# Patient Record
Sex: Male | Born: 1980
Health system: Southern US, Community
[De-identification: ages and names within clinical notes are randomized; demographics above are authoritative.]

## PROBLEM LIST (undated history)

## (undated) DIAGNOSIS — K219 Gastro-esophageal reflux disease without esophagitis: Secondary | ICD-10-CM

## (undated) HISTORY — DX: Gastro-esophageal reflux disease without esophagitis: K21.9

---

## 2000-07-06 ENCOUNTER — Emergency Department (HOSPITAL_COMMUNITY): Admission: EM | Admit: 2000-07-06 | Discharge: 2000-07-06 | Payer: Self-pay | Admitting: Emergency Medicine

## 2000-10-09 ENCOUNTER — Inpatient Hospital Stay (HOSPITAL_COMMUNITY): Admission: EM | Admit: 2000-10-09 | Discharge: 2000-10-15 | Payer: Self-pay | Admitting: Psychiatry

## 2003-01-30 ENCOUNTER — Emergency Department (HOSPITAL_COMMUNITY): Admission: EM | Admit: 2003-01-30 | Discharge: 2003-01-30 | Payer: Self-pay | Admitting: Emergency Medicine

## 2003-01-30 ENCOUNTER — Inpatient Hospital Stay (HOSPITAL_COMMUNITY): Admission: EM | Admit: 2003-01-30 | Discharge: 2003-02-08 | Payer: Self-pay | Admitting: Psychiatry

## 2005-04-24 IMAGING — CT CT HEAD W/O CM
2 of 5 series · 15 of 40 positions shown, 18 images · non-contrast
Comparison: none

CLINICAL DATA: Status post fall with right shoulder and left orbital pain.  
RIGHT SHOULDER (THREE VIEWS) 01/30/03
There is no evidence of fracture or dislocation. No other significant bone or soft tissue abnormalities are identified.

[Series 104: supine sinues · axial · 0.33mm/px · z∈[+54,+162]mm · 12 of 203 slices shown, 15 images]
[im 11/203  brain]
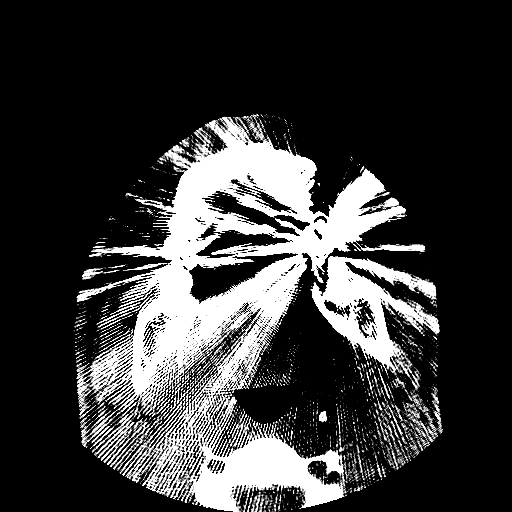
[im 11/203  bone]
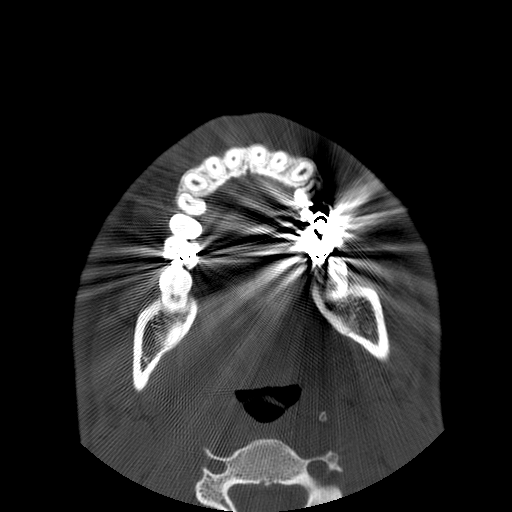
[im 32/203  brain]
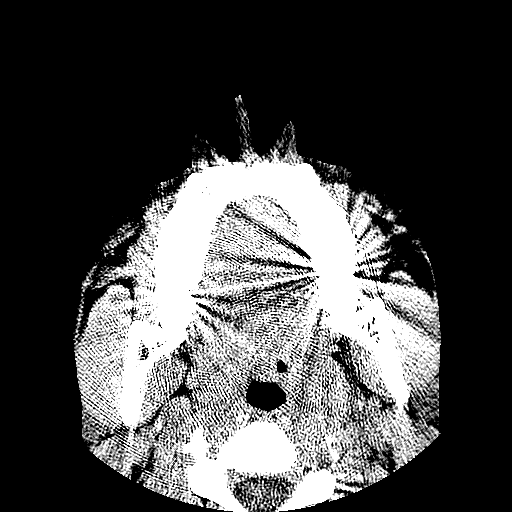
[im 43/203  brain]
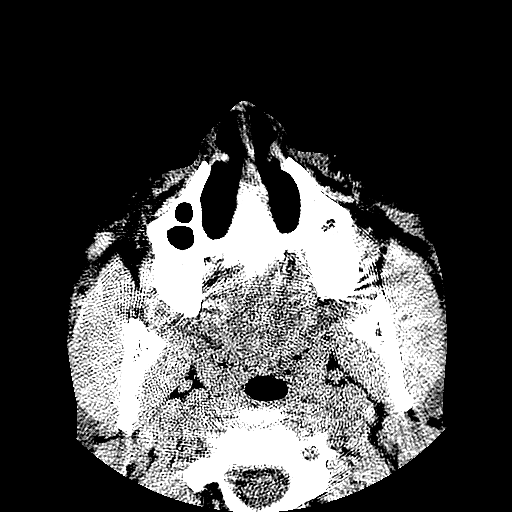
[im 64/203  brain]
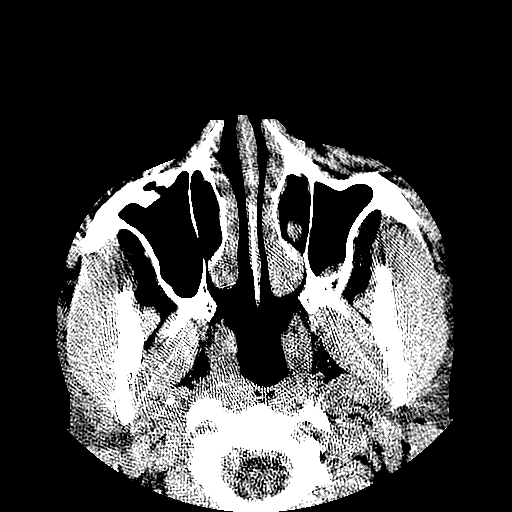
[im 75/203  brain]
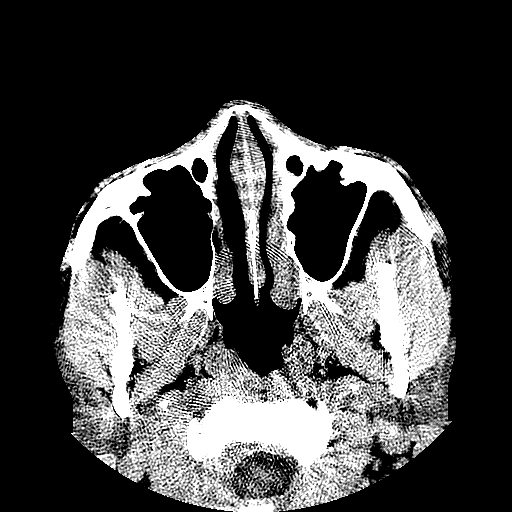
[im 75/203  bone]
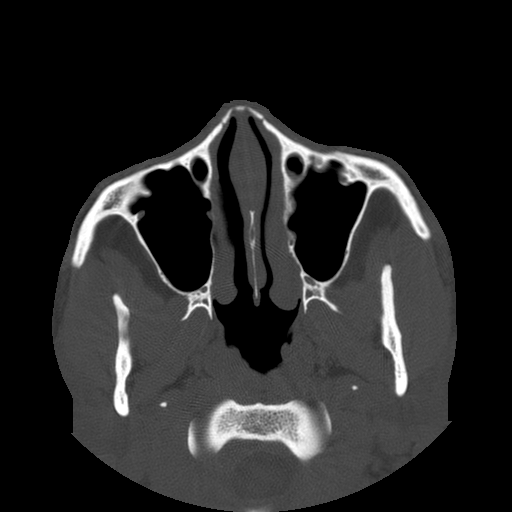
[im 96/203  brain]
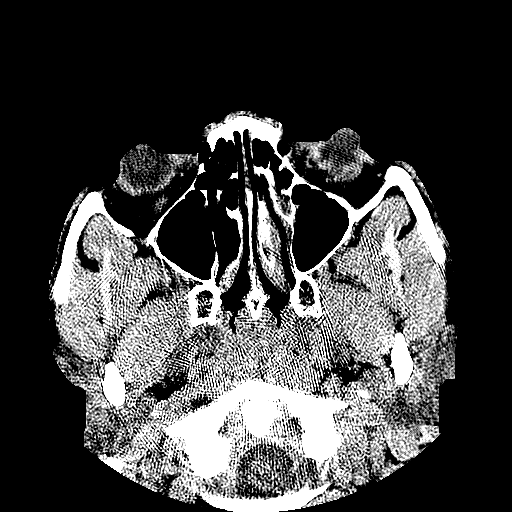
[im 107/203  brain]
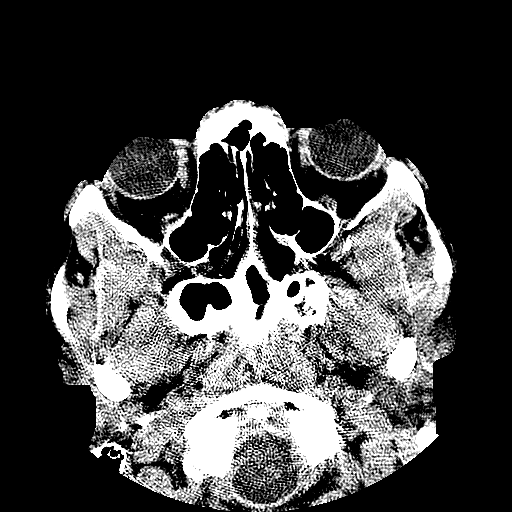
[im 128/203  brain]
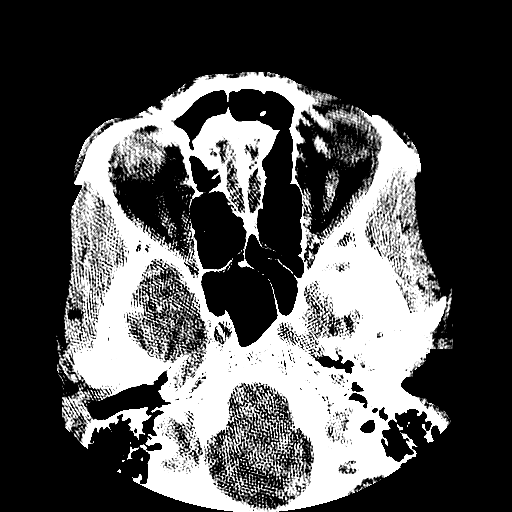
[im 139/203  brain]
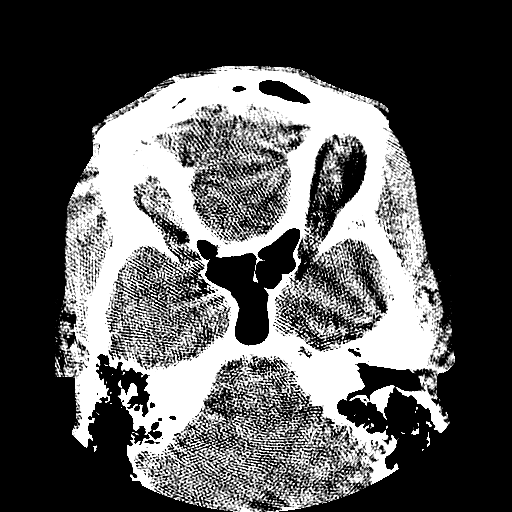
[im 139/203  bone]
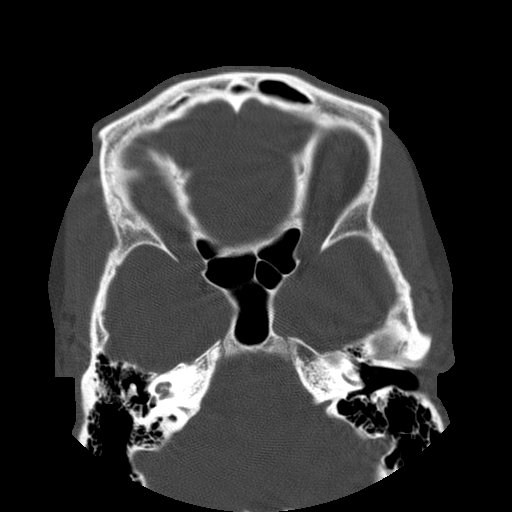
[im 160/203  brain]
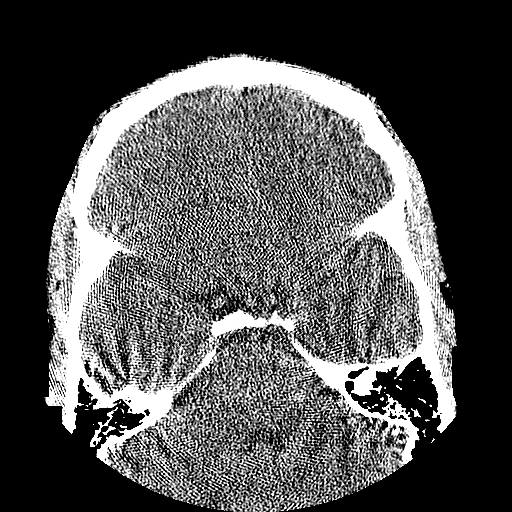
[im 171/203  brain]
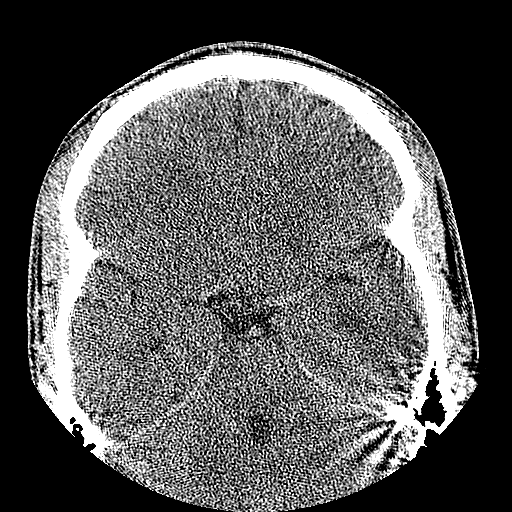
[im 192/203  brain]
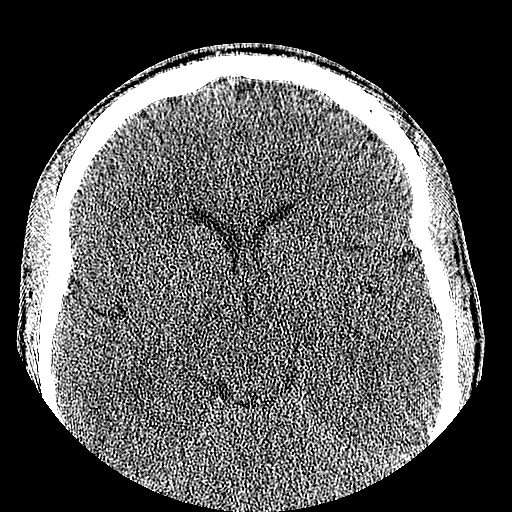

[Series 105: reformatted · coronal · 0.33mm/px · 3 of 42 slices shown]
[im 14/42  brain]
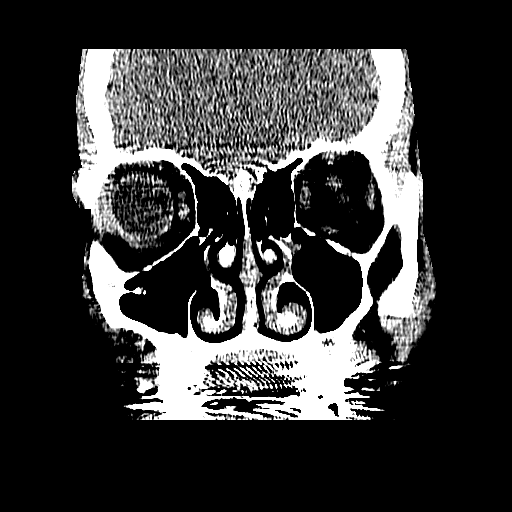
[im 19/42  brain]
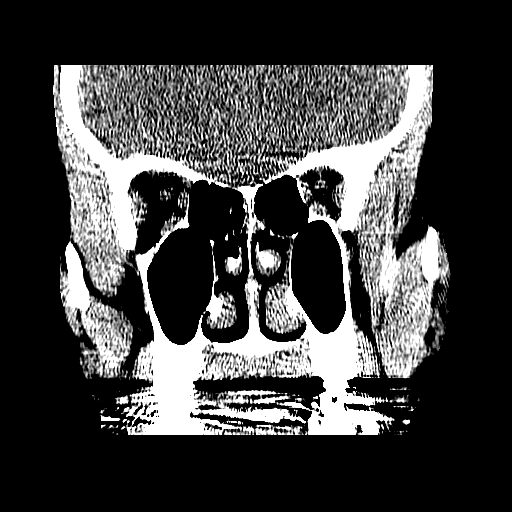
[im 23/42  brain]
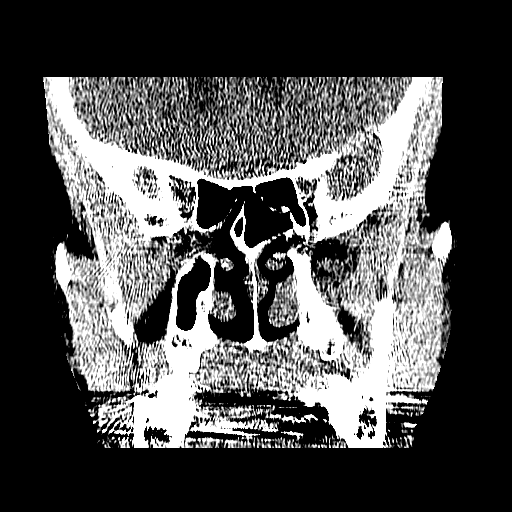

[15 of 40 positions shown; findings below may reference images not displayed]

IMPRESSION
Normal study. 
CT HEAD WITHOUT CONTRAST 01/30/03
Routine noncontrast head CT was performed. 

There is no evidence of intracranial hemorrhage, brain edema, or mass effect. The ventricles are normal. No extraaxial abnormalities are identified. Bone windows show no significant abnormalities.

IMPRESSION

Negative noncontrast head CT. 

ORBITAL CT WITHOUT CONTRAST 01/30/03

Multidetector helical CT imaging is performed through the orbits in the axial plane without IV contrast.  Coronal images were then reconstructed.  
There is lucency noted through the base of the zygomatic arch as it joins the left lateral orbit.  This is asymmetric when compared to the right side. Question a nondisplaced fracture through the base of the zygomatic arch.  This alternatively could represent an unfused suture.  No other fracture is seen.  Mucoperiosteal thickening noted in the left maxillary sinus.  Left orbit unremarkable.  
IMPRESSION
Asymmetric linear lucency through the anterior base of the left zygomatic arch as it joins the left lateral orbital wall.  Question nondisplaced fracture versus unfused suture.  

ree

## 2006-02-17 ENCOUNTER — Ambulatory Visit: Payer: Self-pay | Admitting: *Deleted

## 2006-02-17 ENCOUNTER — Inpatient Hospital Stay (HOSPITAL_COMMUNITY): Admission: AD | Admit: 2006-02-17 | Discharge: 2006-02-22 | Payer: Self-pay | Admitting: *Deleted

## 2006-12-22 ENCOUNTER — Ambulatory Visit: Payer: Self-pay | Admitting: Psychiatry

## 2006-12-22 ENCOUNTER — Inpatient Hospital Stay (HOSPITAL_COMMUNITY): Admission: AD | Admit: 2006-12-22 | Discharge: 2006-12-31 | Payer: Self-pay | Admitting: Psychiatry

## 2007-06-03 ENCOUNTER — Emergency Department (HOSPITAL_COMMUNITY): Admission: EM | Admit: 2007-06-03 | Discharge: 2007-06-03 | Payer: Self-pay | Admitting: Emergency Medicine

## 2007-06-05 ENCOUNTER — Inpatient Hospital Stay (HOSPITAL_COMMUNITY): Admission: AD | Admit: 2007-06-05 | Discharge: 2007-06-21 | Payer: Self-pay | Admitting: *Deleted

## 2007-06-05 ENCOUNTER — Ambulatory Visit: Payer: Self-pay | Admitting: *Deleted

## 2010-02-24 ENCOUNTER — Encounter: Payer: Self-pay | Admitting: Psychiatry

## 2010-06-19 NOTE — H&P (Signed)
NAME:  James [name], James [name] NO.:  [id number]   MEDICAL RECORD NO.:  [id number]          PATIENT TYPE:  IPS   LOCATION:  0403                          [facility]ACILITY[facility]  [facility]   PHYSICIAN:  Jasmine Pang, M.D. DATE OF BIRTH:  [birth year]   DATE OF ADMISSION:  06/05/2007  DATE OF DISCHARGE:                       PSYCHIATRIC ADMISSION ASSESSMENT   PATIENT IDENTIFICATION:  This is a 30 year old involuntary committed on  Jun 05, 2007.   HISTORY OF PRESENT ILLNESS:  The patient is here on petition with  petition papers stating the patient has been exhibiting posturing, lying  on the floor, tapping his feet like he is dancing and having increased  agitation.  Believes his family is trying to kill him.  Also reports a  poor sleep and poor hygiene.  The patient was apparently petitioned per  his father.  Also stated on petition papers that the patient is hostile  and aggressive, history of schizophrenia, respondent, was hearing voices  and hallucinating and is considered a danger to himself and others.   PAST PSYCHIATRIC HISTORY:  This is a second admission.  The patient was  here in November 2008 for threatening behaviors at home.  Patient of  [facility].   SOCIAL HISTORY:  This is a 30 year old single male whose living  conditions are unknown.  Support system is unclear.  It appears to be  his father right now is one of his sole support.   FAMILY HISTORY:  Unknown.   DRUG HISTORY:  The patient smokes cigarettes.  No apparent alcohol and  drug use.   PRIMARY CARE Hermilo Dutter:  Unclear.   MEDICAL PROBLEMS:  No known acute or chronic health conditions.   MEDICATIONS:  The patient has been noncompliant with his Risperdal  injections.   DRUG ALLERGIES:  No known allergies.   PHYSICAL EXAMINATION:  GENERAL:  This is a thin young male who is  walking the halls at this time, was fully assessed at [facility].  VITAL SIGNS:  Temperature is 97.9, 67 heart  rate, 60 respirations, blood  pressure is 112/66, 99% saturated.  He is 152 pounds, 5 feet 10 inches  tall, somewhat disheveled.  His laboratory data:  Urine drug screen is  negative.  Urinalysis negative.  CBC within normal limits.  B-met within  normal limits.   MENTAL STATUS EXAM:  This is an alert, again young male, tall.  Poor eye  contact.  Again, walking the halls, seems unaware that someone is  talking to him trying to obtain information.  There is poor eye contact.  He is having some thought blocking.  Appears to be internally  distracted.  Facial expression is constricted.  Thought process  endorsing auditory hallucinations with some possible paranoid ideation.  He is a poor historian.  His judgment insight is poor.   DIAGNOSES:  AXIS I:  Schizophrenia, paranoid type.  AXIS II:  Deferred.  AXIS III:  No known medical conditions.  AXIS IV:  Psychosocial problems related to burden of illness.  AXIS V:  Current is 25.   PLANS:  Contact for safety.  We  will stabilize mood thinking.  We will  initiate Risperdal at bedtime.  Have Risperdal and Ativan available on a  p.r.n. basis.  Will continue to gather more history.  Assess his support  system.  Reinforce medication compliance.  Case manager will assess his  follow-up.  His tentative length of stay is 5-7 days, more depending on  patient response to medication.      Landry Corporal, N.P.      Jasmine Pang, M.D.  Electronically Signed    JO/MEDQ  D:  06/10/2007  T:  06/10/2007  Job:  [id number]
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
## 2010-06-22 NOTE — Discharge Summary (Signed)
NAME:  James [name], James [name].:  [id number]   MEDICAL RECORD [name].:  [id number]          PATIENT TYPE:  IPS   LOCATION:  0406                          FACILITY:  [facility]   PHYSICIAN:  Anselm Jungling, MD  DATE OF BIRTH:  [birth year]   DATE OF ADMISSION:  12/22/2006  DATE OF DISCHARGE:  12/31/2006                               DISCHARGE SUMMARY   IDENTIFYING DATA/REASON FOR ADMISSION:  This was an inpatient  psychiatric admission for James [name], a 30 year old single African American  male who lives with his parents and is unemployed.  He was petitioned by  his mother due to threatening behaviors at home.  He had a long history  of mental illness and had been a client of [facility].  Please refer to the admission note for further details  pertaining to the symptoms, circumstances and history that led to his  hospitalization.  He was given an initial Axis I diagnosis of  schizophrenia, chronic paranoid type, acute exacerbation.   MEDICAL/LABORATORY:  The patient was medically and physically assessed  by the psychiatric nurse practitioner.  He was in good health without  any active or chronic medical problems.   HOSPITAL COURSE:  The patient was admitted to the Adult Inpatient  Psychiatric Service.  He presented as a well-nourished, well-developed  young man who was only partially oriented initially, quite soft-spoken,  suspicious and guarded.  He was immediately focused on the question of  whether he could go home.  He made [name] overtly delusional statements.  Although he denied auditory hallucinations, he appeared to be responding  to internal stimuli.   The patient was treated with Haldol, which he had been treated with  previously at [facility].  We attempted to involve  him in the inpatient milieu, but he remained quite isolative, withdrawn,  and guarded.  It was difficult to cajole him into participating at all.  He continued to deny  auditory hallucinations, but still appeared  internally preoccupied.   The psychiatric case manager was in contact with the patient's parents,  and there was a family meeting that occurred on the sixth hospital day,  in which the patient's condition and aftercare needs discussed at  length.   The patient continued to be a poor participant in the treatment program.  He remained only interested in discharge throughout his stay.  He was  discharged at the point where it was felt that he was not likely to  benefit from any further hospitalization, and when we concluded that we  were not going to be able to get him to participate to any degree that  was going to benefit him.   AFTERCARE:  The patient was to follow up at the [facility] with  their psychiatrist on January 06, 2007.   DISCHARGE MEDICATIONS:  1. Haldol Decanoate 150 mg IM every 2 weeks, next due January 06, 2007.  2. Cogentin 1 mg b.i.d.  3. Haldol 5 mg orally in the morning and 10 mg orally at bedtime.   DISCHARGE DIAGNOSES:  AXIS  I: Schizophrenia, chronic paranoid type.  AXIS II: Deferred.  AXIS III: [name] acute or chronic illnesses.  AXIS IV: Stressors severe.  AXIS V: Global assessment of functioning (GAF) on discharge 45.      Anselm Jungling, MD  Electronically Signed     SPB/MEDQ  D:  01/20/2007  T:  01/21/2007  Job:  [id number]

## 2010-06-22 NOTE — Discharge Summary (Signed)
NAME:  YAZID, POP NO.:  [id number]   MEDICAL RECORD NO.:  [id number]          PATIENT TYPE:  IPS   LOCATION:  0403                          FACILITY:  [facility]   PHYSICIAN:  Antonietta Breach, M.D.  DATE OF BIRTH:  06/12/1980   DATE OF ADMISSION:  06/05/2007  DATE OF DISCHARGE:  06/21/2007                               DISCHARGE SUMMARY   IDENTIFYING DATA AND REASON FOR ADMISSION:  Mr. Harol Shabazz is a 30-  year-old male who presented with bizarre behavior.  He was having  increased agitation.  He was complaining that his family was trying to  kill and that his sleep was poor.  He was also displaying poor hygiene.   MEDICAL PROBLEMS:  None.   HOSPITAL COURSE:  Mr. Dubuque was admitted to the inpatient adult  psychiatric unit of the [facility] and underwent milieu  and group psychotherapy.  His Risperdal was titrated to 1 mg q.a.m. and  3 mg every evening.  He also had Risperdal Consta given 37.5 mg IM x1 on  Jun 15, 2007.   He was continued on Cogentin 1 mg t.i.d. p.r.n. as well as Ativan 2 mg  q.6 h p.r.n.   By Jun 17, 2007 Mr. Hazen had improved.  He was cooperative and  redirectable.  He was not showing any delusions, hallucinations or  destructive thoughts.   CONDITION ON DISCHARGE:  By Jun 21, 2007 Mr. Caraher was continuing in  a stable pattern.  He had to wait a few extra days for his parents to  come back from out of town.  They did not want him to be discharged home  without their presence.  He had continued to be redirectable and  socially appropriate.  He was not having any thoughts of harming himself  or others.  His delusions and hallucinations had remained resolved.   DISCHARGE/PLAN:  Please see the discharge instruction sheet.   DISCHARGE MEDICATIONS:  1. Risperdal 3 mg every evening.  2. The patient will be receiving Risperdal Consta injection 37.5 mg on      approximately Jun 29, 2007.   DISCHARGE DIAGNOSES:  AXIS  I:  Schizophrenia paranoid type, chronic with  acute exacerbation, now stable.  AXIS II:  None.  AXIS III:  None.  AXIS IV: Primary support group.  AXIS V: 55.      Antonietta Breach, M.D.  Electronically Signed    JW/MEDQ  D:  08/17/2007  T:  08/17/2007  Job:  [id number]
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
## 2010-10-30 LAB — URINALYSIS, ROUTINE W REFLEX MICROSCOPIC
Bilirubin Urine: NEGATIVE
Glucose, UA: NEGATIVE
Hgb urine dipstick: NEGATIVE
Ketones, ur: NEGATIVE
Nitrite: NEGATIVE
Protein, ur: NEGATIVE
Specific Gravity, Urine: 1.025
Urobilinogen, UA: 1
pH: 6.5

## 2010-10-30 LAB — RAPID URINE DRUG SCREEN, HOSP PERFORMED
Amphetamines: NOT DETECTED
Barbiturates: NOT DETECTED
Benzodiazepines: NOT DETECTED
Cocaine: NOT DETECTED
Opiates: NOT DETECTED
Tetrahydrocannabinol: NOT DETECTED

## 2010-10-30 LAB — COMPREHENSIVE METABOLIC PANEL
ALT: 23
AST: 22
Albumin: 3.7
Alkaline Phosphatase: 56
BUN: 8
CO2: 28
Calcium: 9.3
Chloride: 109
Creatinine, Ser: 0.79
GFR calc Af Amer: >60
GFR calc non Af Amer: >60
Glucose, Bld: 84
Potassium: 3.6
Sodium: 142
Total Bilirubin: 0.8
Total Protein: 6.2

## 2010-10-30 LAB — CBC
HCT: 43.2
Hemoglobin: 13.7
MCHC: 31.7
MCV: 88.4
Platelets: 197
RBC: 4.88
RDW: 14.3
WBC: 4.7

## 2010-10-30 LAB — DIFFERENTIAL
Basophils Absolute: 0
Basophils Relative: 1
Eosinophils Absolute: 0.2
Eosinophils Relative: 4
Lymphocytes Relative: 36
Lymphs Abs: 1.7
Monocytes Absolute: 0.4
Monocytes Relative: 9
Neutro Abs: 2.3
Neutrophils Relative %: 50

## 2010-10-30 LAB — ETHANOL: Alcohol, Ethyl (B): <5

## 2010-11-13 LAB — CBC
HCT: 44.2
Hemoglobin: 15.4
MCHC: 34.9
MCV: 85.9
Platelets: 194
RBC: 5.14
RDW: 13.9
WBC: 3.7 — ABNORMAL LOW

## 2010-11-13 LAB — COMPREHENSIVE METABOLIC PANEL
ALT: 36
AST: 28
Albumin: 3.9
Alkaline Phosphatase: 70
BUN: 13
CO2: 29
Calcium: 9.6
Chloride: 105
Creatinine, Ser: 0.98
GFR calc Af Amer: >60
GFR calc non Af Amer: >60
Glucose, Bld: 103 — ABNORMAL HIGH
Potassium: 3.9
Sodium: 139
Total Bilirubin: 0.7
Total Protein: 6.6

## 2010-11-13 LAB — TSH: TSH: 0.404

## 2013-10-30 DIAGNOSIS — F259 Schizoaffective disorder, unspecified: Secondary | ICD-10-CM | POA: Diagnosis not present

## 2013-12-30 DIAGNOSIS — F259 Schizoaffective disorder, unspecified: Secondary | ICD-10-CM | POA: Diagnosis not present

## 2015-05-02 ENCOUNTER — Ambulatory Visit: Payer: Self-pay | Admitting: Internal Medicine

## 2015-05-10 ENCOUNTER — Telehealth: Payer: Self-pay

## 2015-05-11 ENCOUNTER — Encounter: Payer: Self-pay | Admitting: Medical

## 2015-05-11 ENCOUNTER — Ambulatory Visit (INDEPENDENT_AMBULATORY_CARE_PROVIDER_SITE_OTHER): Payer: Medicare Other | Admitting: Medical

## 2015-05-11 VITALS — BP 100/70 | HR 89 | Temp 97.9°F | Ht 70.5 in | Wt 183.2 lb

## 2015-05-11 DIAGNOSIS — F17200 Nicotine dependence, unspecified, uncomplicated: Secondary | ICD-10-CM

## 2015-05-11 DIAGNOSIS — Z720 Tobacco use: Secondary | ICD-10-CM

## 2015-05-11 DIAGNOSIS — F209 Schizophrenia, unspecified: Secondary | ICD-10-CM | POA: Diagnosis not present

## 2015-05-11 DIAGNOSIS — R739 Hyperglycemia, unspecified: Secondary | ICD-10-CM | POA: Diagnosis not present

## 2015-05-11 DIAGNOSIS — D72819 Decreased white blood cell count, unspecified: Secondary | ICD-10-CM | POA: Diagnosis not present

## 2015-05-11 LAB — CBC WITH DIFFERENTIAL/PLATELET
Basophils Absolute: 0 10*3/uL (ref 0.0–0.1)
Basophils Relative: 0.4 % (ref 0.0–3.0)
Eosinophils Absolute: 0.2 10*3/uL (ref 0.0–0.7)
Eosinophils Relative: 2.5 % (ref 0.0–5.0)
HCT: 42.9 % (ref 39.0–52.0)
Hemoglobin: 14.2 g/dL (ref 13.0–17.0)
Lymphocytes Relative: 36.5 % (ref 12.0–46.0)
Lymphs Abs: 2.9 10*3/uL (ref 0.7–4.0)
MCHC: 33.1 g/dL (ref 30.0–36.0)
MCV: 87.3 fl (ref 78.0–100.0)
Monocytes Absolute: 0.5 10*3/uL (ref 0.1–1.0)
Monocytes Relative: 6.4 % (ref 3.0–12.0)
Neutro Abs: 4.3 10*3/uL (ref 1.4–7.7)
Neutrophils Relative %: 54.2 % (ref 43.0–77.0)
Platelets: 237 10*3/uL (ref 150.0–400.0)
RBC: 4.92 Mil/uL (ref 4.22–5.81)
RDW: 15.2 % (ref 11.5–15.5)
WBC: 8 10*3/uL (ref 4.0–10.5)

## 2015-05-11 LAB — COMPREHENSIVE METABOLIC PANEL
ALT: 19 U/L (ref 0–53)
AST: 15 U/L (ref 0–37)
Albumin: 4.5 g/dL (ref 3.5–5.2)
Alkaline Phosphatase: 59 U/L (ref 39–117)
BUN: 9 mg/dL (ref 6–23)
CO2: 28 mEq/L (ref 19–32)
Calcium: 10.2 mg/dL (ref 8.4–10.5)
Chloride: 109 mEq/L (ref 96–112)
Creatinine, Ser: 0.92 mg/dL (ref 0.40–1.50)
GFR: 119.63 mL/min (ref >60.00)
Glucose, Bld: 97 mg/dL (ref 70–99)
Potassium: 4.1 mEq/L (ref 3.5–5.1)
Sodium: 141 mEq/L (ref 135–145)
Total Bilirubin: 0.3 mg/dL (ref 0.2–1.2)
Total Protein: 7.4 g/dL (ref 6.0–8.3)

## 2015-05-11 LAB — HEMOGLOBIN A1C: Hgb A1c MFr Bld: 5.6 % (ref 4.6–6.5)

## 2015-05-11 MED ORDER — METFORMIN HCL 500 MG PO TABS: 500.0000 mg | ORAL_TABLET | Freq: Two times a day (BID) | ORAL | Status: DC

## 2015-05-11 NOTE — Telephone Encounter (Signed)
Will you get rx that was printed and fax to his pharmacy.

## 2015-05-11 NOTE — Patient Instructions (Addendum)
For schizophrenia. Continue on current meds rx'd by psychiatrist. If any worsening  mood or behavior issues notify us and psychiatrist. If severe mood or behavior problems then ED evaluation.  For smoking cessation would get opinion of psychiatrist. Wellbutrin is one method as well as chantix. With his schizophrenia not sure this is good idea.  Will get cbc today to check wbc.  For mild sugar elevation and family hx, will get a1-c and then decide on appropiate dose of metformin.  Follow up in 2 weeks or as needed.  Please sign release of information form so we can get old records.

## 2015-05-11 NOTE — Progress Notes (Signed)
Subjective:    Patient ID: James [name], male    DOB: 1980/08/13, 35 y.o.   MRN: [id number]  HPI   I have reviewed pt PMH, PSH, FH, Social History and Surgical History.  Pt is on disability(mom guardian), pt does exercise about 1 mile a day, no caffeine beverage, Admits not eating healthy, single. Lives with mom.  [facility] He was discharged in  December.  Pt sugar has not been elevated much in the past. One sugar level was 103.  No a1c in the chart. Pt psychiatrist recommend metformin but they did not rx for more than about one month. Pt has been without prescription for a while. So mom gave her own rx.  Pt has history of schizophrenia. Pt denies any visual or auditory hallucinations. Mom states he is much better to manage. Mom has PHD in physics.   Pt has last psychiatrist April 20, 2015 at [facility]. NP psychchiatrist.    Review of Systems  Constitutional: Negative for fever, chills and fatigue.  HENT: Negative for congestion and ear pain.   Respiratory: Negative for cough, choking, shortness of breath and wheezing.   Musculoskeletal: Negative for back pain and gait problem.  Neurological: Negative for dizziness, syncope, speech difficulty and headaches.  Hematological:       Related to dx but stable recently per mom  Psychiatric/Behavioral: Positive for behavioral problems. Negative for suicidal ideas, confusion and dysphoric mood. The patient is not nervous/anxious and is not hyperactive.        My first time meeting him and exhibits odd behavior.    History reviewed. No pertinent past medical history.  Social History   Social History  . Marital Status: Single    Spouse Name: N/A  . Number of Children: N/A  . Years of Education: N/A   Occupational History  . Not on file.   Social History Main Topics  . Smoking status: Current Every Day Smoker  . Smokeless tobacco: Never Used  . Alcohol Use:  No  . Drug Use: No  . Sexual Activity: Not on file   Other Topics Concern  . Not on file   Social History Narrative  . No narrative on file    History reviewed. No pertinent past surgical history.  History reviewed. No pertinent family history.  Allergies  Allergen Reactions  . Clozapine Other (See Comments)    No current outpatient prescriptions on file prior to visit.   No current facility-administered medications on file prior to visit.    BP 100/70 mmHg  Pulse 89  Temp(Src) 97.9 F (36.6 C) (Oral)  Ht 5' 10.5" (1.791 m)  Wt 183 lb 3.2 oz (83.099 kg)  BMI 25.91 kg/m2  SpO2 97%       Objective:   Physical Exam  General Mental Status- Alert. General Appearance- Not in acute distress. Pleasant but has off demeanor.  Skin General: Color- Normal Color. Moisture- Normal Moisture.  Neck Carotid Arteries- Normal color. Moisture- Normal Moisture. No carotid bruits. No JVD.  Chest and Lung Exam Auscultation: Breath Sounds:-Normal.  Cardiovascular Auscultation:Rythm- Regular. Murmurs & Other Heart Sounds:Auscultation of the heart reveals- No Murmurs.  Abdomen Inspection:-Inspeection Normal. Palpation/Percussion:Note:No mass. Palpation and Percussion of the abdomen reveal- Non Tender, Non Distended + BS, no rebound or guarding.   Neurologic Cranial Nerve exam:- CN III-XII intact(No nystagmus), symmetric smile. Strength:- 5/5 equal and symmetric strength both upper and lower extremities.  Assessment & Plan:  For schizophrenia. Continue on current meds rx'd by psychiatrist. If any worsening  mood or behavior issues notify us and psychiatrist. If severe mood or behavior problems then ED evaluation.  For smoking cessation would get opinion of psychiatrist. Wellbutrin is one method as well as chantix. With his schizophrenia not sure this is good idea.  Will get cbc today to check wbc.  For mild sugar elevation and family hx, will get a1-c and then  decide on appropiate dose of metformin.  Follow up in 2 weeks or as needed.  Please sign release of information form so we can get old records.

## 2015-05-11 NOTE — Addendum Note (Signed)
Addended by: Neldon LabellaMABE, HOLDEN S on: 05/11/2015 07:23 PM   Modules accepted: Orders

## 2015-05-11 NOTE — Progress Notes (Signed)
Pre visit review using our clinic review tool, if applicable. No additional management support is needed unless otherwise documented below in the visit note. 

## 2015-06-01 ENCOUNTER — Ambulatory Visit (INDEPENDENT_AMBULATORY_CARE_PROVIDER_SITE_OTHER): Payer: Medicare Other | Admitting: Medical

## 2015-06-01 ENCOUNTER — Encounter: Payer: Self-pay | Admitting: Medical

## 2015-06-01 VITALS — BP 112/72 | HR 78 | Temp 98.3°F | Ht 70.5 in | Wt 181.0 lb

## 2015-06-01 DIAGNOSIS — F17200 Nicotine dependence, unspecified, uncomplicated: Secondary | ICD-10-CM

## 2015-06-01 DIAGNOSIS — F209 Schizophrenia, unspecified: Secondary | ICD-10-CM | POA: Diagnosis not present

## 2015-06-01 DIAGNOSIS — Z720 Tobacco use: Secondary | ICD-10-CM | POA: Diagnosis not present

## 2015-06-01 DIAGNOSIS — K5900 Constipation, unspecified: Secondary | ICD-10-CM | POA: Diagnosis not present

## 2015-06-01 DIAGNOSIS — R739 Hyperglycemia, unspecified: Secondary | ICD-10-CM | POA: Diagnosis not present

## 2015-06-01 NOTE — Progress Notes (Signed)
Pre visit review using our clinic review tool, if applicable. No additional management support is needed unless otherwise documented below in the visit note. 

## 2015-06-01 NOTE — Progress Notes (Signed)
Subjective:    Patient ID: James [name], male    DOB: March 01, 1980, 35 y.o.   MRN: [id number]  HPI  Pt seeing psychiatrist at [facility]. Pt states mood is good today. Hx of schizophrenia.  Pt smoking still. Pt is smoking 2 packs a day.  Pt is on metformin since he had some mild elevated sugars in the past. His a1-c was well controlled on last check.  Occasional mild constipation. Mom give him pericolace.Pt eats a lot of processed and junk food per mom. Pt last bm was yesterday and normal. No abdomen pain. No history bowel obstruction. No abdomen surgeries.    Review of Systems  Constitutional: Negative for fever, chills and fatigue.  Respiratory: Negative for cough, choking, shortness of breath and wheezing.   Cardiovascular: Negative for chest pain and palpitations.  Gastrointestinal: Positive for constipation. Negative for nausea, vomiting, abdominal pain, diarrhea and rectal pain.       See hpi occasional.  Skin: Negative for rash.  Psychiatric/Behavioral: Negative for suicidal ideas, behavioral problems, confusion and dysphoric mood. The patient is not nervous/anxious.        See hpi. Pt see [facility]. Stable today.   No past medical history on file.   Social History   Social History  . Marital Status: Single    Spouse Name: N/A  . Number of Children: N/A  . Years of Education: N/A   Occupational History  . Not on file.   Social History Main Topics  . Smoking status: Current Every Day Smoker -- 2.00 packs/day    Types: Cigarettes  . Smokeless tobacco: Never Used  . Alcohol Use: No     Comment: rare alcohol  . Drug Use: No  . Sexual Activity: Not on file   Other Topics Concern  . Not on file   Social History Narrative    No past surgical history on file.  Family History  Problem Relation Age of Onset  . Diabetes Mother   . Diabetes Father   . Prostate cancer Father     Allergies  Allergen Reactions  . Clozapine Other (See Comments)    Current  Outpatient Prescriptions on File Prior to Visit  Medication Sig Dispense Refill  . LATUDA 80 MG TABS tablet 80 mg 2 (two) times daily.     Marland Kitchen lithium carbonate (ESKALITH) 450 MG CR tablet     . metFORMIN (GLUCOPHAGE) 500 MG tablet Take 1 tablet (500 mg total) by mouth 2 (two) times daily with a meal. 60 tablet 3  . Misc Natural Products (FOCUSED MIND PO) Take 1 capsule by mouth.    . Multiple Vitamins-Minerals (MULTI COMPLETE PO) Take 1 capsule by mouth 1 day or 1 dose.    Marland Kitchen OLANZapine (ZYPREXA) 15 MG tablet      No current facility-administered medications on file prior to visit.    BP 112/72 mmHg  Pulse 78  Temp(Src) 98.3 F (36.8 C) (Oral)  Ht 5' 10.5" (1.791 m)  Wt 181 lb (82.101 kg)  BMI 25.60 kg/m2  SpO2 97%        Objective:   Physical Exam  General Mental Status- Alert. General Appearance- Not in acute distress.   Skin General: Color- Normal Color. Moisture- Normal Moisture.  Neck Carotid Arteries- Normal color. Moisture- Normal Moisture. No carotid bruits. No JVD.  Chest and Lung Exam Auscultation: Breath Sounds:-Normal.  Cardiovascular Auscultation:Rythm- Regular. Murmurs & Other Heart Sounds:Auscultation of the heart reveals- No Murmurs.  Abdomen Inspection:-Inspeection Normal. Palpation/Percussion:Note:No mass.  Palpation and Percussion of the abdomen reveal- Non Tender, Non Distended + BS, no rebound or guarding.  Neurologic Cranial Nerve exam:- CN III-XII intact(No nystagmus), symmetric smile. Strength:- 5/5 equal and symmetric strength both upper and lower extremities.     Assessment & Plan:  For your schizophrenia continue to see psychiatrist.  For your hx of mild high blood sugar would recommend using metformin but only 1 tab twice a day.  Please try to quit smoking or decrease. Methods/med  to quit might be complicated by his his schizophrenia.  For occasional constipation recommend hydrate well and better diet. If no bm after 2 days then  use pericolace or miralax. If 3 days passes and no bm or any abdomen pain then notify us.  Follow up in 3 months or as needed  Wendell Nicoson, Ramon DredgeEdward, VF CorporationPA-C

## 2015-06-01 NOTE — Patient Instructions (Addendum)
For your schizophrenia continue to see psychiatrist.  For your hx of mild high blood sugar would recommend using metformin but only 1 tab twice a day.  Please try to quit smoking or decrease. Methods/meds to  to quit might be complicated by his his schizophrenia.  For occasional constipation recommend hydrate well and better diet. If no bm after 2 days then use pericolace or miralax. If 3 days passes and no bm or any abdomen pain then notify us.  Follow up in 3 months or as needed.(advise on that day schedule cpe and come in fasting)

## 2015-08-23 ENCOUNTER — Other Ambulatory Visit: Payer: Self-pay | Admitting: Medical

## 2015-08-31 ENCOUNTER — Encounter: Payer: Self-pay | Admitting: Medical

## 2015-08-31 ENCOUNTER — Ambulatory Visit (INDEPENDENT_AMBULATORY_CARE_PROVIDER_SITE_OTHER): Payer: Medicare Other | Admitting: Medical

## 2015-08-31 VITALS — BP 110/70 | HR 79 | Temp 98.1°F | Ht 70.5 in | Wt 183.6 lb

## 2015-08-31 DIAGNOSIS — Z720 Tobacco use: Secondary | ICD-10-CM | POA: Diagnosis not present

## 2015-08-31 DIAGNOSIS — F209 Schizophrenia, unspecified: Secondary | ICD-10-CM | POA: Diagnosis not present

## 2015-08-31 DIAGNOSIS — R739 Hyperglycemia, unspecified: Secondary | ICD-10-CM

## 2015-08-31 DIAGNOSIS — F17200 Nicotine dependence, unspecified, uncomplicated: Secondary | ICD-10-CM

## 2015-08-31 DIAGNOSIS — Z5181 Encounter for therapeutic drug level monitoring: Secondary | ICD-10-CM

## 2015-08-31 DIAGNOSIS — Z23 Encounter for immunization: Secondary | ICD-10-CM

## 2015-08-31 NOTE — Progress Notes (Signed)
Subjective:    Patient ID: James [name], male    DOB: 1980/07/23, 35 y.o.   MRN: [id number]  HPI   Pt in for follow up.  Pt ding schizophrenia. Mom is with him. She states he seems improved. He is now telling mom when he starts to walk. Pt mom has been in touch with [facility] that will provide someone maybe to weeks to spend time with him and take him places/provide transportation.  Pt in past has been smoking 2 packs of cigarrettes a day. But he has cut down to 1/2-1 pack.  Pt diet is improved. Last time admitted to eating a lot of junk food. Pt was placed on metformin in past. Given per mom as preventative measure/written by psychiatry. So appeared in past may have had some hyperglycemia.  His behavior per mom is more calm and he is nicer to her.  Pt last saw pscychiatrist one month ago.   Pt appears to be need tdap.     Review of Systems  Constitutional: Negative for chills, fatigue and fever.  HENT: Negative for congestion and drooling.   Respiratory: Negative for cough, chest tightness, shortness of breath and wheezing.   Cardiovascular: Negative for chest pain and palpitations.  Musculoskeletal: Negative for back pain.  Skin: Negative for rash.  Neurological: Negative for dizziness and headaches.  Hematological: Negative for adenopathy. Does not bruise/bleed easily.  Psychiatric/Behavioral: Negative for behavioral problems, decreased concentration, hallucinations, sleep disturbance and suicidal ideas. The patient is not nervous/anxious.        Pt denies any visual hallucination or auditory. Mom thinks he may hear some. Sometimes laughs on occasion for no reason.   No past medical history on file.   Social History   Social History  . Marital status: Single    Spouse name: N/A  . Number of children: N/A  . Years of education: N/A   Occupational History  . Not on file.   Social History Main Topics  . Smoking status: Current Every Day Smoker    Packs/day:  2.00    Types: Cigarettes  . Smokeless tobacco: Never Used  . Alcohol use No     Comment: rare alcohol  . Drug use: No  . Sexual activity: Not on file   Other Topics Concern  . Not on file   Social History Narrative  . No narrative on file    No past surgical history on file.  Family History  Problem Relation Age of Onset  . Diabetes Mother   . Diabetes Father   . Prostate cancer Father     Allergies  Allergen Reactions  . Clozapine Other (See Comments)    Current Outpatient Prescriptions on File Prior to Visit  Medication Sig Dispense Refill  . LATUDA 80 MG TABS tablet 80 mg 2 (two) times daily.     Marland Kitchen lithium carbonate (ESKALITH) 450 MG CR tablet     . metFORMIN (GLUCOPHAGE) 500 MG tablet TAKE ONE TABLET BY MOUTH TWICE DAILY WITH MEALS 60 tablet 0  . Misc Natural Products (FOCUSED MIND PO) Take 1 capsule by mouth.    . Multiple Vitamins-Minerals (MULTI COMPLETE PO) Take 1 capsule by mouth 1 day or 1 dose.    Marland Kitchen OLANZapine (ZYPREXA) 15 MG tablet      No current facility-administered medications on file prior to visit.     BP 110/70 (BP Location: Right Arm, Patient Position: Sitting, Cuff Size: Normal)   Pulse 79   Temp 98.1 F (  36.7 C) (Oral)   Ht 5' 10.5" (1.791 m)   Wt 183 lb 9.6 oz (83.3 kg)   SpO2 98%   BMI 25.97 kg/m       Objective:   Physical Exam  General Mental Status- Alert. General Appearance- Not in acute distress.(pt appears to interact better today. Last time would chuckle/laugh at times inappropriately. Now not doing so today.  Skin General: Color- Normal Color. Moisture- Normal Moisture.  Neck Carotid Arteries- Normal color. Moisture- Normal Moisture. No carotid bruits. No JVD.  Chest and Lung Exam Auscultation: Breath Sounds:-Normal.  Cardiovascular Auscultation:Rythm- Regular. Murmurs & Other Heart Sounds:Auscultation of the heart reveals- No Murmurs.  Abdomen Inspection:-Inspeection Normal. Palpation/Percussion:Note:No mass.  Palpation and Percussion of the abdomen reveal- Non Tender, Non Distended + BS, no rebound or guarding.  Neurologic Cranial Nerve exam:- CN III-XII intact(No nystagmus), symmetric smile. Strength:- 5/5 equal and symmetric strength both upper and lower extremities.      Assessment & Plan:  Pt appears to be doing well with his schizophrenia. He is compliant on his meds and pscyhiatrist visits.  You had very good a1-c on last visit. So recommend continue metformin, low sugar diet and exercise/walk 30 minutes a day.  Continue to cut back on smoking. The psychiatrist offered med but patient refused. If you change your mind please notify psychiatrist so he could write the medication.  Will give tdap today.  Follow up in 3-6  months or as needed (depends on how he is doing)  Will put in future labs to be done in 3 months.  Annjeanette Sarwar, [name], PA-C

## 2015-08-31 NOTE — Progress Notes (Signed)
Pre visit review using our clinic review tool, if applicable. No additional management support is needed unless otherwise documented below in the visit note. 

## 2015-08-31 NOTE — Addendum Note (Signed)
Addended by: Neldon Labella on: 08/31/2015 10:02 AM   Modules accepted: Orders

## 2015-08-31 NOTE — Patient Instructions (Addendum)
Pt appears to be doing well with his schizophrenia. He is compliant on his meds and pscyhiatrist visits.  You had very good a1-c on last visit. So recommend continue metformin, low sugar diet and exercise/walk 30 minutes a day.  Continue to cut back on smoking. The psychiatrist offered med but patient refused. If you change your mind please notify psychiatrist so he could write the medication.  Will give tdap today  Will put in future labs to be done in 3 months.  Follow up in 3-23months or as needed(depends on how he is doing)

## 2015-09-21 DIAGNOSIS — F209 Schizophrenia, unspecified: Secondary | ICD-10-CM | POA: Diagnosis not present

## 2015-09-29 ENCOUNTER — Other Ambulatory Visit: Payer: Self-pay | Admitting: Medical

## 2015-11-01 ENCOUNTER — Other Ambulatory Visit: Payer: Self-pay | Admitting: Medical

## 2015-11-29 ENCOUNTER — Other Ambulatory Visit: Payer: Self-pay | Admitting: Medical

## 2015-12-06 ENCOUNTER — Other Ambulatory Visit: Payer: Self-pay

## 2015-12-06 NOTE — Telephone Encounter (Signed)
Opened in error

## 2015-12-07 DIAGNOSIS — F209 Schizophrenia, unspecified: Secondary | ICD-10-CM | POA: Diagnosis not present

## 2016-01-05 ENCOUNTER — Other Ambulatory Visit: Payer: Self-pay | Admitting: Medical

## 2016-02-08 ENCOUNTER — Other Ambulatory Visit: Payer: Self-pay | Admitting: Medical

## 2016-02-13 ENCOUNTER — Ambulatory Visit (INDEPENDENT_AMBULATORY_CARE_PROVIDER_SITE_OTHER): Payer: Medicare Other | Admitting: Medical

## 2016-02-13 ENCOUNTER — Encounter: Payer: Self-pay | Admitting: Medical

## 2016-02-13 VITALS — BP 115/78 | HR 89 | Temp 98.2°F | Resp 16 | Ht 71.0 in | Wt 172.4 lb

## 2016-02-13 DIAGNOSIS — R739 Hyperglycemia, unspecified: Secondary | ICD-10-CM

## 2016-02-13 DIAGNOSIS — F209 Schizophrenia, unspecified: Secondary | ICD-10-CM

## 2016-02-13 DIAGNOSIS — R946 Abnormal results of thyroid function studies: Secondary | ICD-10-CM

## 2016-02-13 DIAGNOSIS — R7989 Other specified abnormal findings of blood chemistry: Secondary | ICD-10-CM

## 2016-02-13 LAB — COMPREHENSIVE METABOLIC PANEL
ALBUMIN: 4.5 g/dL (ref 3.5–5.2)
ALK PHOS: 60 U/L (ref 39–117)
ALT: 18 U/L (ref 0–53)
AST: 15 U/L (ref 0–37)
BUN: 9 mg/dL (ref 6–23)
CALCIUM: 10 mg/dL (ref 8.4–10.5)
CHLORIDE: 109 meq/L (ref 96–112)
CO2: 27 mEq/L (ref 19–32)
Creatinine, Ser: 0.95 mg/dL (ref 0.40–1.50)
GFR: 114.8 mL/min (ref >60.00)
Glucose, Bld: 92 mg/dL (ref 70–99)
POTASSIUM: 4.2 meq/L (ref 3.5–5.1)
SODIUM: 142 meq/L (ref 135–145)
TOTAL PROTEIN: 7.6 g/dL (ref 6.0–8.3)
Total Bilirubin: 0.3 mg/dL (ref 0.2–1.2)

## 2016-02-13 LAB — HEMOGLOBIN A1C: HEMOGLOBIN A1C: 5.4 % (ref 4.6–6.5)

## 2016-02-13 LAB — TSH: TSH: 1.02 u[IU]/mL (ref 0.35–4.50)

## 2016-02-13 LAB — T4, FREE: Free T4: 0.77 ng/dL (ref 0.60–1.60)

## 2016-02-13 NOTE — Patient Instructions (Addendum)
For your shizophrenia. Continue current psychiatric medications. And follow up with psychiatrist.  For hx of mild high sugar and on med can increase sugar continue metformin.  Flu vaccine declined today.  Will check your tsh, t4, a1c and cmp today.  Follow up in 3 months or and needed

## 2016-02-13 NOTE — Progress Notes (Signed)
Subjective:    Patient ID: James [name], male    DOB: 06/25/1980, 36 y.o.   MRN: [id number]  HPI  Pt in with mom. Pt doing ok with his mood. Most of time mom states cooperative and pleasant. Short episodes when pt  disagreeable.   Pt has seen his psychiatrist in November per pt. Pt is on zyprexa and lithiium.   Pt willing to go back to psychiatrist. Pt was [facility]. They try to teach him life skills. Pt does not want to attend. He wants to study literature.   Pt started [facility] program. Program is 2 years. He wants to stop the program but almost midway through.  Pt does not want to take flu vaccine. He refuses.  Pt metabolic panel looks good.   Mild sugar increase in the past and he is on low dose metformin.(on meds that can increase sugar). Psychiatrist originally wrote the rx of metformin.    Review of Systems  Constitutional: Negative for chills, fatigue and fever.  Respiratory: Negative for cough, chest tightness and shortness of breath.   Cardiovascular: Negative for palpitations.  Gastrointestinal: Negative for abdominal pain.  Endocrine: Negative for polydipsia and polyuria.  Neurological: Negative for dizziness and headaches.  Hematological: Negative for adenopathy. Does not bruise/bleed easily.  Psychiatric/Behavioral: Negative for agitation, confusion, dysphoric mood, sleep disturbance and suicidal ideas. The patient is not nervous/anxious.        See hpi.    No past medical history on file.   Social History   Social History  . Marital status: Single    Spouse name: N/A  . Number of children: N/A  . Years of education: N/A   Occupational History  . Not on file.   Social History Main Topics  . Smoking status: Current Every Day Smoker    Packs/day: 2.00    Types: Cigarettes  . Smokeless tobacco: Never Used  . Alcohol use No     Comment: rare alcohol  . Drug use: No  . Sexual activity: Not on file   Other Topics Concern  .  Not on file   Social History Narrative  . No narrative on file    No past surgical history on file.  Family History  Problem Relation Age of Onset  . Diabetes Mother   . Diabetes Father   . Prostate cancer Father     Allergies  Allergen Reactions  . Clozapine Other (See Comments)  . Mango Flavor     Mango; [place]: Patient has no known reaction to report. Mom states he just don't like.    Current Outpatient Prescriptions on File Prior to Visit  Medication Sig Dispense Refill  . LATUDA 80 MG TABS tablet 80 mg 2 (two) times daily.     Marland Kitchen. lithium carbonate (ESKALITH) 450 MG CR tablet Take 900 mg by mouth daily.     . metFORMIN (GLUCOPHAGE) 500 MG tablet TAKE ONE TABLET BY MOUTH TWICE DAILY WITH MEALS 60 tablet 0  . Misc Natural Products (FOCUSED MIND PO) Take 4 capsules by mouth 2 (two) times daily.     . Multiple Vitamins-Minerals (MULTI COMPLETE PO) Take 2 each by mouth 1 day or 1 dose. Two Chews Daily.    Marland Kitchen. OLANZapine (ZYPREXA) 15 MG tablet Take 30 mg by mouth at bedtime.      No current facility-administered medications on file prior to visit.     BP 115/78 (BP Location: Left Arm, Patient Position: Sitting, Cuff Size: Large)  Pulse 89   Temp 98.2 F (36.8 C) (Oral)   Resp 16   Ht 5\' 11"  (1.803 m)   Wt 172 lb 6 oz (78.2 kg)   SpO2 99%   BMI 24.04 kg/m       Objective:   Physical Exam  General Mental Status- Alert. General Appearance- Not in acute distress.   Skin General: Color- Normal Color. Moisture- Normal Moisture.  Neck Carotid Arteries- Normal color. Moisture- Normal Moisture. No carotid bruits. No JVD.  Chest and Lung Exam Auscultation: Breath Sounds:-Normal.  Cardiovascular Auscultation:Rythm- Regular. Murmurs & Other Heart Sounds:Auscultation of the heart reveals- No Murmurs.  Abdomen Inspection:-Inspeection Normal. Palpation/Percussion:Note:No mass. Palpation and Percussion of the abdomen reveal- Non Tender, Non Distended + BS, no rebound  or guarding.    Neurologic Cranial Nerve exam:- CN III-XII intact(No nystagmus), symmetric smile. Strength:- 5/5 equal and symmetric strength both upper and lower extremities.      Assessment & Plan:  For your shizophrenia. Continue current psychiatric medications. And follow up with psychiatrist.  For hx of mild high sugar and on med can increase sugar continue metformin.  Flu vaccine declined today.  Will check your tsh, t4, a1c and cmp today.  Follow up in 3 months or and needed  Atif Chapple, [name], [place]

## 2016-02-13 NOTE — Progress Notes (Signed)
Pre visit review using our clinic review tool, if applicable. No additional management support is needed unless otherwise documented below in the visit note/SLS  

## 2016-02-20 DIAGNOSIS — F209 Schizophrenia, unspecified: Secondary | ICD-10-CM | POA: Diagnosis not present

## 2016-03-10 ENCOUNTER — Other Ambulatory Visit: Payer: Self-pay | Admitting: Family

## 2016-03-22 ENCOUNTER — Ambulatory Visit (INDEPENDENT_AMBULATORY_CARE_PROVIDER_SITE_OTHER): Payer: Medicare Other

## 2016-03-22 DIAGNOSIS — Z23 Encounter for immunization: Secondary | ICD-10-CM | POA: Diagnosis not present

## 2016-04-05 DIAGNOSIS — F209 Schizophrenia, unspecified: Secondary | ICD-10-CM | POA: Diagnosis not present

## 2016-04-08 DIAGNOSIS — Z79899 Other long term (current) drug therapy: Secondary | ICD-10-CM | POA: Diagnosis not present

## 2016-04-08 DIAGNOSIS — F209 Schizophrenia, unspecified: Secondary | ICD-10-CM | POA: Diagnosis not present

## 2016-05-02 ENCOUNTER — Telehealth: Payer: Self-pay | Admitting: *Deleted

## 2016-05-02 NOTE — Telephone Encounter (Signed)
LM for patient to return call to schedule AWV.   

## 2016-05-14 ENCOUNTER — Ambulatory Visit: Payer: Medicare Other | Admitting: Medical

## 2016-06-26 DIAGNOSIS — F209 Schizophrenia, unspecified: Secondary | ICD-10-CM | POA: Diagnosis not present

## 2016-08-05 ENCOUNTER — Other Ambulatory Visit: Payer: Self-pay | Admitting: Family

## 2016-08-20 DIAGNOSIS — Z79899 Other long term (current) drug therapy: Secondary | ICD-10-CM | POA: Diagnosis not present

## 2016-08-20 DIAGNOSIS — F209 Schizophrenia, unspecified: Secondary | ICD-10-CM | POA: Diagnosis not present

## 2016-08-20 DIAGNOSIS — F1211 Cannabis abuse, in remission: Secondary | ICD-10-CM | POA: Diagnosis not present

## 2016-10-09 ENCOUNTER — Other Ambulatory Visit: Payer: Self-pay

## 2016-10-09 MED ORDER — METFORMIN HCL 500 MG PO TABS: 500.0000 mg | ORAL_TABLET | Freq: Two times a day (BID) | ORAL | 1 refills | Status: DC

## 2016-10-09 NOTE — Telephone Encounter (Signed)
Pharmacy faxed request to have refill quantity changed from 60 to 90 day supply per Pt's request. Adjustment made..Marland Kitchen

## 2016-10-16 ENCOUNTER — Other Ambulatory Visit: Payer: Self-pay | Admitting: Family

## 2016-10-16 NOTE — Telephone Encounter (Signed)
lvm advising patient of message below °

## 2016-10-16 NOTE — Telephone Encounter (Signed)
2 week supply of Metformin sent to pharmacy. Pt last seen 02/2016 and advised 3 month follow up and is past due. Please call pt to schedule appt with Ramon DredgeEdward as soon as possible. Thanks!

## 2016-10-23 ENCOUNTER — Telehealth: Payer: Self-pay | Admitting: Medical

## 2016-10-23 ENCOUNTER — Ambulatory Visit: Payer: Medicare Other | Admitting: Medical

## 2016-10-23 DIAGNOSIS — Z0289 Encounter for other administrative examinations: Secondary | ICD-10-CM

## 2016-10-23 MED ORDER — METFORMIN HCL 500 MG PO TABS: 500.0000 mg | ORAL_TABLET | Freq: Two times a day (BID) | ORAL | 0 refills | Status: DC

## 2016-10-23 NOTE — Telephone Encounter (Signed)
Refill of metformin of med pending appointment.

## 2016-10-29 DIAGNOSIS — F209 Schizophrenia, unspecified: Secondary | ICD-10-CM | POA: Diagnosis not present

## 2016-10-31 ENCOUNTER — Ambulatory Visit (INDEPENDENT_AMBULATORY_CARE_PROVIDER_SITE_OTHER): Payer: Medicare Other | Admitting: Medical

## 2016-10-31 ENCOUNTER — Encounter: Payer: Self-pay | Admitting: Medical

## 2016-10-31 VITALS — BP 118/72 | HR 67 | Temp 97.8°F | Resp 16 | Ht 72.0 in | Wt 182.4 lb

## 2016-10-31 DIAGNOSIS — R5383 Other fatigue: Secondary | ICD-10-CM

## 2016-10-31 DIAGNOSIS — F17200 Nicotine dependence, unspecified, uncomplicated: Secondary | ICD-10-CM

## 2016-10-31 DIAGNOSIS — Z23 Encounter for immunization: Secondary | ICD-10-CM

## 2016-10-31 DIAGNOSIS — Z5181 Encounter for therapeutic drug level monitoring: Secondary | ICD-10-CM | POA: Diagnosis not present

## 2016-10-31 DIAGNOSIS — F209 Schizophrenia, unspecified: Secondary | ICD-10-CM | POA: Diagnosis not present

## 2016-10-31 DIAGNOSIS — R739 Hyperglycemia, unspecified: Secondary | ICD-10-CM

## 2016-10-31 LAB — COMPREHENSIVE METABOLIC PANEL
ALBUMIN: 4.5 g/dL (ref 3.5–5.2)
ALT: 35 U/L (ref 0–53)
AST: 20 U/L (ref 0–37)
Alkaline Phosphatase: 50 U/L (ref 39–117)
BUN: 8 mg/dL (ref 6–23)
CHLORIDE: 108 meq/L (ref 96–112)
CO2: 28 mEq/L (ref 19–32)
Calcium: 10 mg/dL (ref 8.4–10.5)
Creatinine, Ser: 1.03 mg/dL (ref 0.40–1.50)
GFR: 104.16 mL/min (ref >60.00)
Glucose, Bld: 100 mg/dL — ABNORMAL HIGH (ref 70–99)
POTASSIUM: 4.1 meq/L (ref 3.5–5.1)
SODIUM: 140 meq/L (ref 135–145)
TOTAL PROTEIN: 7.3 g/dL (ref 6.0–8.3)
Total Bilirubin: 0.4 mg/dL (ref 0.2–1.2)

## 2016-10-31 LAB — CBC WITH DIFFERENTIAL/PLATELET
Basophils Absolute: 0 10*3/uL (ref 0.0–0.1)
Basophils Relative: 0.6 % (ref 0.0–3.0)
EOS ABS: 0.1 10*3/uL (ref 0.0–0.7)
Eosinophils Relative: 2.5 % (ref 0.0–5.0)
HCT: 45.1 % (ref 39.0–52.0)
HEMOGLOBIN: 14.9 g/dL (ref 13.0–17.0)
Lymphocytes Relative: 45.9 % (ref 12.0–46.0)
Lymphs Abs: 2.4 10*3/uL (ref 0.7–4.0)
MCHC: 33 g/dL (ref 30.0–36.0)
MCV: 91.3 fl (ref 78.0–100.0)
MONO ABS: 0.5 10*3/uL (ref 0.1–1.0)
Monocytes Relative: 8.9 % (ref 3.0–12.0)
NEUTROS PCT: 42.1 % — AB (ref 43.0–77.0)
Neutro Abs: 2.2 10*3/uL (ref 1.4–7.7)
Platelets: 226 10*3/uL (ref 150.0–400.0)
RBC: 4.93 Mil/uL (ref 4.22–5.81)
RDW: 14.5 % (ref 11.5–15.5)
WBC: 5.2 10*3/uL (ref 4.0–10.5)

## 2016-10-31 LAB — TSH: TSH: 1.02 u[IU]/mL (ref 0.35–4.50)

## 2016-10-31 LAB — HEMOGLOBIN A1C: HEMOGLOBIN A1C: 5.4 % (ref 4.6–6.5)

## 2016-10-31 LAB — T4, FREE: FREE T4: 0.71 ng/dL (ref 0.60–1.60)

## 2016-10-31 NOTE — Patient Instructions (Addendum)
For your history of hyperglycemia, I will get an A1c today and determine if you need to be on the same dose of metformin or if you need increase.  For history of schizophrenia, I recommend continuing to see the psychiatrist. I would recommend participation in potential program that social worker is trying to range for you.  For smoking cessation, I would recommend that you call the psychiatrist office in a week or so if they have not called back regarding potential medications to help you stop smoking.  Please go to the lab to get A1c, CMP and thyroid studies.  Follow up in 3 months or as needed.  Flu vaccine given today.

## 2016-10-31 NOTE — Progress Notes (Signed)
Subjective:    Patient ID: James [name], male    DOB: 1980-10-29, 36 y.o.   MRN: [id number]  HPI Pt in for check up /evaluation. He is still seeing psychiatrist. Pt is still on zyprexa, lithium and cogentin.   Pt was doing a program at [facility] but he stopped than in winter or spring. Pt mom is trying to get him into a new program. Pt in past had some desire to study and go to school. Psychiatrists states he is trying to get social worker to help patient get in peer supported program.  Pt ok with getting flu vaccine today. Last year he got vaccine late.  Pt is still smoking. Currently smoking about a pack a day. Mom has discussed possible medication. Psychiatrist is considering giving him a medication(not sure if considering wellbutrin?).   Review of Systems  Constitutional: Positive for fatigue. Negative for chills and fever.       Occasional fatigue  Respiratory: Negative for cough, chest tightness, shortness of breath and wheezing.   Cardiovascular: Negative for chest pain and palpitations.  Gastrointestinal: Negative for abdominal pain.  Endocrine: Negative for polydipsia, polyphagia and polyuria.  Genitourinary: Negative for dysuria and frequency.  Musculoskeletal: Negative for back pain, myalgias and neck stiffness.  Skin: Negative for rash.  Neurological: Negative for dizziness, speech difficulty, weakness, light-headedness and headaches.  Hematological: Negative for adenopathy. Does not bruise/bleed easily.  Psychiatric/Behavioral: Negative for behavioral problems, confusion, dysphoric mood, sleep disturbance and suicidal ideas. The patient is not nervous/anxious.        Doing well and seeing psychiatrist.    No past medical history on file.   Social History   Social History  . Marital status: Single    Spouse name: N/A  . Number of children: N/A  . Years of education: N/A   Occupational History  . Not on file.   Social History Main Topics  . Smoking  status: Current Every Day Smoker    Packs/day: 2.00    Types: Cigarettes  . Smokeless tobacco: Never Used  . Alcohol use No     Comment: rare alcohol  . Drug use: No  . Sexual activity: Not on file   Other Topics Concern  . Not on file   Social History Narrative  . No narrative on file    No past surgical history on file.  Family History  Problem Relation Age of Onset  . Diabetes Mother   . Diabetes Father   . Prostate cancer Father     Allergies  Allergen Reactions  . Clozapine Other (See Comments)  . Mango Flavor     Mango; [place]: Patient has no known reaction to report. Mom states he just don't like.    Current Outpatient Prescriptions on File Prior to Visit  Medication Sig Dispense Refill  . LATUDA 80 MG TABS tablet 80 mg 2 (two) times daily.     Marland Kitchen lithium carbonate (ESKALITH) 450 MG CR tablet Take 900 mg by mouth daily.     . metFORMIN (GLUCOPHAGE) 500 MG tablet Take 1 tablet (500 mg total) by mouth 2 (two) times daily with a meal. 28 tablet 0  . Misc Natural Products (FOCUSED MIND PO) Take 4 capsules by mouth 2 (two) times daily.     . Multiple Vitamins-Minerals (MULTI COMPLETE PO) Take 2 each by mouth 1 day or 1 dose. Two Chews Daily.    Marland Kitchen OLANZapine (ZYPREXA) 15 MG tablet Take 30 mg by mouth at bedtime.  No current facility-administered medications on file prior to visit.     BP 118/72   Pulse 67   Temp 97.8 F (36.6 C) (Oral)   Resp 16   Ht 6' (1.829 m)   Wt 182 lb 6.4 oz (82.7 kg)   SpO2 100%   BMI 24.74 kg/m       Objective:   Physical Exam  General Mental Status- Alert. General Appearance- Not in acute distress.   Skin General: Color- Normal Color. Moisture- Normal Moisture.  Neck Carotid Arteries- Normal color. Moisture- Normal Moisture. No carotid bruits. No JVD.  Chest and Lung Exam Auscultation: Breath Sounds:-Normal.  Cardiovascular Auscultation:Rythm- Regular. Murmurs & Other Heart Sounds:Auscultation of the heart  reveals- No Murmurs.  Abdomen Inspection:-Inspeection Normal. Palpation/Percussion:Note:No mass. Palpation and Percussion of the abdomen reveal- Non Tender, Non Distended + BS, no rebound or guarding.  Neurologic Cranial Nerve exam:- CN III-XII intact(No nystagmus), symmetric smile. Strength:- 5/5 equal and symmetric strength both upper and lower extremities.      Assessment & Plan:  For your history of hyperglycemia, I will get an A1c today and determine if you need to be on the same dose of metformin or if you need increase.  For history of schizophrenia, I recommend continuing to see the psychiatrist. I would recommend participation in potential program that social worker is trying to range for you.  For smoking cessation, I would recommend that you call the psychiatrist office in a week or so if they have not called back regarding potential medications to help you stop smoking.  Please go to the lab to get A1c, CMP and thyroid studies.  Follow up in 3 months or as needed.  Flu vaccine given today.  Meeyah Ovitt, [name], PA-C 1 Fanny Dance summary Levaquin daily for the year. The occasional doesn't feel that

## 2017-01-21 ENCOUNTER — Encounter: Payer: Self-pay | Admitting: Medical

## 2017-01-21 ENCOUNTER — Ambulatory Visit (INDEPENDENT_AMBULATORY_CARE_PROVIDER_SITE_OTHER): Payer: Medicare Other | Admitting: Medical

## 2017-01-21 VITALS — BP 107/82 | HR 78 | Temp 98.0°F | Resp 16 | Ht 73.0 in | Wt 171.4 lb

## 2017-01-21 DIAGNOSIS — F209 Schizophrenia, unspecified: Secondary | ICD-10-CM | POA: Diagnosis not present

## 2017-01-21 DIAGNOSIS — F17200 Nicotine dependence, unspecified, uncomplicated: Secondary | ICD-10-CM

## 2017-01-21 DIAGNOSIS — R739 Hyperglycemia, unspecified: Secondary | ICD-10-CM

## 2017-01-21 LAB — COMPREHENSIVE METABOLIC PANEL
ALBUMIN: 4.5 g/dL (ref 3.5–5.2)
ALT: 34 U/L (ref 0–53)
AST: 24 U/L (ref 0–37)
Alkaline Phosphatase: 53 U/L (ref 39–117)
BUN: 8 mg/dL (ref 6–23)
CHLORIDE: 109 meq/L (ref 96–112)
CO2: 27 mEq/L (ref 19–32)
CREATININE: 1.01 mg/dL (ref 0.40–1.50)
Calcium: 9.6 mg/dL (ref 8.4–10.5)
GFR: 106.42 mL/min (ref >60.00)
Glucose, Bld: 87 mg/dL (ref 70–99)
Potassium: 4.3 mEq/L (ref 3.5–5.1)
SODIUM: 142 meq/L (ref 135–145)
Total Bilirubin: 0.4 mg/dL (ref 0.2–1.2)
Total Protein: 7.3 g/dL (ref 6.0–8.3)

## 2017-01-21 NOTE — Patient Instructions (Signed)
Your blood sugars have been very minimally elevated in the past but your A1c was good.  Your specialist had wanted you to be on metformin.  You expressed some concern about side effects of metformin.  I do not think you absolutely need to be on this presently.  If you want you can hold metformin for 3 months and we can repeat A1c on follow-up.  See if A1c is going up.  Also today we will check metabolic panel to see if random sugar elevated.  In the event that sugars are increasing we could restart metformin.  Tomorrow when you see the psychiatrist please get his opinion on whether he thinks Wellbutrin can be used for smoking cessation.  We will update you on metabolic panel when results back.  Follow-up in 3 months or as needed.

## 2017-01-21 NOTE — Progress Notes (Signed)
Subjective:    Patient ID: James [name], male    DOB: 05/27/1980, 36 y.o.   MRN: [id number]  HPI  Pt in for follow up.  He states he feels well. He states good mood. Pt sees his psychiatrist tomorrow. Hx of schizophrenia.   Pt mom concerned about metformin. Concern for effect on kidney. Pt a1c has been good and only mild high random sugar in past on cmp. CMP showed good kidney function in past.  He is p.m. please urgent Would you want you to write your past code anything down second illness sometimes  Pt has decided to stop smoking. Pt reluctant to take medication for this. I had mentioned wellbutrin in past but wanted him to discuss with his psychiatrist.    Review of Systems  Constitutional: Negative for chills, fatigue and fever.  HENT: Negative for congestion, ear discharge, ear pain, facial swelling, hearing loss and postnasal drip.   Respiratory: Negative for cough, chest tightness, shortness of breath and wheezing.   Cardiovascular: Negative for chest pain and palpitations.  Gastrointestinal: Negative for abdominal pain.  Musculoskeletal: Negative for back pain and joint swelling.  Neurological: Negative for dizziness, tremors, speech difficulty, weakness and headaches.  Hematological: Negative for adenopathy. Does not bruise/bleed easily.  Psychiatric/Behavioral: Negative for behavioral problems, confusion, hallucinations, sleep disturbance and suicidal ideas. The patient is not nervous/anxious.        See hpi.    No past medical history on file.   Social History   Socioeconomic History  . Marital status: Single    Spouse name: Not on file  . Number of children: Not on file  . Years of education: Not on file  . Highest education level: Not on file  Social Needs  . Financial resource strain: Not on file  . Food insecurity - worry: Not on file  . Food insecurity - inability: Not on file  . Transportation needs - medical: Not on file  . Transportation needs -  non-medical: Not on file  Occupational History  . Not on file  Tobacco Use  . Smoking status: Current Every Day Smoker    Packs/day: 2.00    Types: Cigarettes  . Smokeless tobacco: Never Used  Substance and Sexual Activity  . Alcohol use: No    Alcohol/week: 0.0 oz    Comment: rare alcohol  . Drug use: No  . Sexual activity: Not on file  Other Topics Concern  . Not on file  Social History Narrative  . Not on file    No past surgical history on file.  Family History  Problem Relation Age of Onset  . Diabetes Mother   . Diabetes Father   . Prostate cancer Father     Allergies  Allergen Reactions  . Clozapine Other (See Comments)  . Mango Flavor     Mango; [place]: Patient has no known reaction to report. Mom states he just don't like.    Current Outpatient Medications on File Prior to Visit  Medication Sig Dispense Refill  . benztropine (COGENTIN) 0.5 MG tablet Take 0.5 mg by mouth 2 (two) times daily.    Marland Kitchen. LATUDA 80 MG TABS tablet 80 mg 2 (two) times daily.     Marland Kitchen. lithium carbonate (ESKALITH) 450 MG CR tablet Take 900 mg by mouth daily.     . metFORMIN (GLUCOPHAGE) 500 MG tablet Take 1 tablet (500 mg total) by mouth 2 (two) times daily with a meal. 28 tablet 0  . Misc Natural  Products (FOCUSED MIND PO) Take 4 capsules by mouth 2 (two) times daily.     . Multiple Vitamins-Minerals (MULTI COMPLETE PO) Take 2 each by mouth 1 day or 1 dose. Two Chews Daily.    Marland Kitchen. OLANZapine (ZYPREXA) 15 MG tablet Take 30 mg by mouth at bedtime.      No current facility-administered medications on file prior to visit.     BP 107/82   Pulse 78   Temp 98 F (36.7 C) (Oral)   Resp 16   Ht 6\' 1"  (1.854 m)   Wt 171 lb 6.4 oz (77.7 kg)   SpO2 100%   BMI 22.61 kg/m       Objective:   Physical Exam   General Mental Status- Alert. General Appearance- Not in acute distress.   Skin General: Color- Normal Color. Moisture- Normal Moisture.  Neck Carotid Arteries- Normal color.  Moisture- Normal Moisture. No carotid bruits. No JVD.  Chest and Lung Exam Auscultation: Breath Sounds:-Normal.  Cardiovascular Auscultation:Rythm- Regular. Murmurs & Other Heart Sounds:Auscultation of the heart reveals- No Murmurs.  Abdomen Inspection:-Inspeection Normal. Palpation/Percussion:Note:No mass. Palpation and Percussion of the abdomen reveal- Non Tender, Non Distended + BS, no rebound or guarding.  Neurologic Cranial Nerve exam:- CN III-XII intact(No nystagmus), symmetric smile. Strength:- 5/5 equal and symmetric strength both upper and lower extremities.     Assessment & Plan:  Your blood sugars have been very minimally elevated in the past but your A1c was good.  Your specialist had wanted you to be on metformin.  You expressed some concern about side effects of metformin.  I do not think you absolutely need to be on this presently.  If you want you can hold metformin for 3 months and we can repeat A1c on follow-up.  See if A1c is going up.  Also today we will check metabolic panel to see if random sugar elevated.  In the event that sugars are increasing we could restart metformin.  Tomorrow when you see the psychiatrist please get his opinion on whether he thinks Wellbutrin can be used for smoking cessation.  We will update you on metabolic panel when results back.  Follow-up in 3 months or as needed.  Rolen Conger, Ramon DredgeEdward, PA-C

## 2017-01-22 DIAGNOSIS — F209 Schizophrenia, unspecified: Secondary | ICD-10-CM | POA: Diagnosis not present

## 2017-02-27 ENCOUNTER — Other Ambulatory Visit: Payer: Self-pay | Admitting: Medical

## 2017-04-24 ENCOUNTER — Encounter: Payer: Self-pay | Admitting: Medical

## 2017-04-24 ENCOUNTER — Ambulatory Visit (INDEPENDENT_AMBULATORY_CARE_PROVIDER_SITE_OTHER): Payer: Medicare Other | Admitting: Medical

## 2017-04-24 VITALS — BP 112/76 | HR 83 | Temp 97.8°F | Resp 16 | Ht 72.0 in | Wt 165.4 lb

## 2017-04-24 DIAGNOSIS — R739 Hyperglycemia, unspecified: Secondary | ICD-10-CM

## 2017-04-24 DIAGNOSIS — Z5181 Encounter for therapeutic drug level monitoring: Secondary | ICD-10-CM | POA: Diagnosis not present

## 2017-04-24 DIAGNOSIS — F17200 Nicotine dependence, unspecified, uncomplicated: Secondary | ICD-10-CM

## 2017-04-24 DIAGNOSIS — F209 Schizophrenia, unspecified: Secondary | ICD-10-CM | POA: Diagnosis not present

## 2017-04-24 DIAGNOSIS — R7989 Other specified abnormal findings of blood chemistry: Secondary | ICD-10-CM | POA: Diagnosis not present

## 2017-04-24 LAB — TSH: TSH: 1.09 u[IU]/mL (ref 0.35–4.50)

## 2017-04-24 LAB — HEMOGLOBIN A1C: Hgb A1c MFr Bld: 5.3 % (ref 4.6–6.5)

## 2017-04-24 NOTE — Progress Notes (Signed)
Subjective:    Patient ID: James [name], male    DOB: 1980/05/11, 37 y.o.   MRN: [id number]  HPI He states he feels well. He states good mood. Pt sees his psychiatrist next week. Hx of schizophrenia.   Pt states he is on Wellbutrin and he has cut down smoking from 10 cigarretes to 5 cigarretes. Has been on Wellbutrin for about 2-3 months.  Pt is on  metformin. Pt a1c has been good and only mild high random sugar in past on cmp. CMP showed good kidney function in past.   Pt is on lithium as well as other meds. Thyrorid function has been normal.     Review of Systems  Constitutional: Negative for chills, fatigue and fever.  Respiratory: Negative for cough, chest tightness and shortness of breath.   Gastrointestinal: Negative for abdominal distention, abdominal pain, constipation, nausea and vomiting.  Musculoskeletal: Negative for back pain.  Neurological: Negative for dizziness and headaches.  Psychiatric/Behavioral: Negative for agitation, behavioral problems, dysphoric mood, sleep disturbance and suicidal ideas. The patient is not nervous/anxious.        Stable presently and sees psychiatrist.    No past medical history on file.   Social History   Socioeconomic History  . Marital status: Single    Spouse name: Not on file  . Number of children: Not on file  . Years of education: Not on file  . Highest education level: Not on file  Occupational History  . Not on file  Social Needs  . Financial resource strain: Not on file  . Food insecurity:    Worry: Not on file    Inability: Not on file  . Transportation needs:    Medical: Not on file    Non-medical: Not on file  Tobacco Use  . Smoking status: Current Every Day Smoker    Packs/day: 2.00    Types: Cigarettes  . Smokeless tobacco: Never Used  Substance and Sexual Activity  . Alcohol use: No    Alcohol/week: 0.0 oz    Comment: rare alcohol  . Drug use: No  . Sexual activity: Not on file  Lifestyle  .  Physical activity:    Days per week: Not on file    Minutes per session: Not on file  . Stress: Not on file  Relationships  . Social connections:    Talks on phone: Not on file    Gets together: Not on file    Attends religious service: Not on file    Active member of club or organization: Not on file    Attends meetings of clubs or organizations: Not on file    Relationship status: Not on file  . Intimate partner violence:    Fear of current or ex partner: Not on file    Emotionally abused: Not on file    Physically abused: Not on file    Forced sexual activity: Not on file  Other Topics Concern  . Not on file  Social History Narrative  . Not on file    No past surgical history on file.  Family History  Problem Relation Age of Onset  . Diabetes Mother   . Diabetes Father   . Prostate cancer Father     Allergies  Allergen Reactions  . Clozapine Other (See Comments)  . Mango Flavor     Mango; [place]: Patient has no known reaction to report. Mom states he just don't like.    Current Outpatient Medications on File  Prior to Visit  Medication Sig Dispense Refill  . benztropine (COGENTIN) 0.5 MG tablet Take 0.5 mg by mouth 2 (two) times daily.    Marland Kitchen buPROPion (ZYBAN) 150 MG 12 hr tablet     . LATUDA 80 MG TABS tablet 80 mg 2 (two) times daily.     Marland Kitchen lithium carbonate (ESKALITH) 450 MG CR tablet Take 900 mg by mouth daily.     . metFORMIN (GLUCOPHAGE) 500 MG tablet Take 1 tablet (500 mg total) by mouth 2 (two) times daily with a meal. 28 tablet 0  . metFORMIN (GLUCOPHAGE) 500 MG tablet TAKE 1 TABLET BY MOUTH TWICE DAILY WITH A MEAL 180 tablet 0  . Misc Natural Products (FOCUSED MIND PO) Take 4 capsules by mouth 2 (two) times daily.     . Multiple Vitamins-Minerals (MULTI COMPLETE PO) Take 2 each by mouth 1 day or 1 dose. Two Chews Daily.    Marland Kitchen OLANZapine (ZYPREXA) 15 MG tablet Take 30 mg by mouth at bedtime.      No current facility-administered medications on file prior to  visit.     BP 112/76   Pulse 83   Temp 97.8 F (36.6 C) (Oral)   Resp 16   Ht 6' (1.829 m)   Wt 165 lb 6.4 oz (75 kg)   SpO2 100%   BMI 22.43 kg/m       Objective:   Physical Exam  General Mental Status- Alert. General Appearance- Not in acute distress.   Skin General: Color- Normal Color. Moisture- Normal Moisture.  Neck Carotid Arteries- Normal color. Moisture- Normal Moisture. No carotid bruits. No JVD.  Chest and Lung Exam Auscultation: Breath Sounds:-Normal.  Cardiovascular Auscultation:Rythm- Regular. Murmurs & Other Heart Sounds:Auscultation of the heart reveals- No Murmurs.  Abdomen Inspection:-Inspeection Normal. Palpation/Percussion:Note:No mass. Palpation and Percussion of the abdomen reveal- Non Tender, Non Distended + BS, no rebound or guarding.    Neurologic Cranial Nerve exam:- CN III-XII intact(No nystagmus), symmetric smile. Strength:- 5/5 equal and symmetric strength both upper and lower extremities.      Assessment & Plan:  Will repeat a1c due to high sugar level in past and use of psychiatrist meds.  You appear to be doing well mood wise. Continue with counseling and psychiatrist.  Good job on smoking reduction. Try to quit completley by 3 months.  Repeat labs today include a1c, cmp and tsh.  Follow up in 3 months or as needed  [employer], [employer]

## 2017-04-24 NOTE — Patient Instructions (Addendum)
Will repeat a1c due to high sugar level in past and use of psychiatrist meds.  You appear to be doing well mood wise. Continue with counseling and psychiatrist.  Good job on smoking reduction. Try to quit completley by 3 months.  Repeat labs today include a1c cmp and tsh.  Follow up in 3 months or as needed

## 2017-04-25 LAB — COMPREHENSIVE METABOLIC PANEL
ALBUMIN: 4.5 g/dL (ref 3.5–5.2)
ALT: 21 U/L (ref 0–53)
AST: 15 U/L (ref 0–37)
Alkaline Phosphatase: 52 U/L (ref 39–117)
BILIRUBIN TOTAL: 0.2 mg/dL (ref 0.2–1.2)
BUN: 13 mg/dL (ref 6–23)
CALCIUM: 9.9 mg/dL (ref 8.4–10.5)
CHLORIDE: 110 meq/L (ref 96–112)
CO2: 23 meq/L (ref 19–32)
CREATININE: 0.98 mg/dL (ref 0.40–1.50)
GFR: 110.03 mL/min (ref >60.00)
Glucose, Bld: 90 mg/dL (ref 70–99)
Potassium: 4.2 mEq/L (ref 3.5–5.1)
Sodium: 141 mEq/L (ref 135–145)
Total Protein: 7.3 g/dL (ref 6.0–8.3)

## 2017-04-30 DIAGNOSIS — F209 Schizophrenia, unspecified: Secondary | ICD-10-CM | POA: Diagnosis not present

## 2017-06-03 ENCOUNTER — Other Ambulatory Visit: Payer: Self-pay | Admitting: Medical

## 2017-07-17 DIAGNOSIS — F209 Schizophrenia, unspecified: Secondary | ICD-10-CM | POA: Diagnosis not present

## 2017-07-17 DIAGNOSIS — F1211 Cannabis abuse, in remission: Secondary | ICD-10-CM | POA: Diagnosis not present

## 2017-07-23 DIAGNOSIS — F1211 Cannabis abuse, in remission: Secondary | ICD-10-CM | POA: Diagnosis not present

## 2017-07-23 DIAGNOSIS — F209 Schizophrenia, unspecified: Secondary | ICD-10-CM | POA: Diagnosis not present

## 2017-07-24 ENCOUNTER — Encounter: Payer: Self-pay | Admitting: Medical

## 2017-07-24 ENCOUNTER — Ambulatory Visit (INDEPENDENT_AMBULATORY_CARE_PROVIDER_SITE_OTHER): Payer: Medicare Other | Admitting: Medical

## 2017-07-24 VITALS — BP 110/81 | HR 96 | Temp 98.2°F | Resp 16 | Ht 72.0 in | Wt 169.2 lb

## 2017-07-24 DIAGNOSIS — F17200 Nicotine dependence, unspecified, uncomplicated: Secondary | ICD-10-CM | POA: Diagnosis not present

## 2017-07-24 DIAGNOSIS — F209 Schizophrenia, unspecified: Secondary | ICD-10-CM | POA: Diagnosis not present

## 2017-07-24 DIAGNOSIS — R739 Hyperglycemia, unspecified: Secondary | ICD-10-CM

## 2017-07-24 NOTE — Patient Instructions (Signed)
I am glad to hear that you had decreased your number of cigarettes down to 3 cigarettes a day.  Continue Wellbutrin but please try to taper off those remaining 3 cigarettes.  This will be beneficial for your long-term health/lung function.  Your mood is stable and would recommend continuing treatment/meds prescribed by psychiatrist.  Your sugar has been historically controlled and would recommend that you continue metformin.  We will get A1c when you get fasting physical exam labs.  Follow-up in 10 to 14 days for fasting CPE.  Recommend that you schedule early morning 8AM.

## 2017-07-24 NOTE — Progress Notes (Signed)
Subjective:    Patient ID: James [name], male    DOB: 01/10/1981, 37 y.o.   MRN: [id number]  HPI    Pt in states his mood is good. Stable. He is still seeing his psychiatrist for schizophrenia. No changes to meds. Pt is on lithium and Zyprexa. Thyrorid function has been normal. Lithium level done other day at specialist office.   Before he saw me historically he was placed on metformin.  Pt is on Wellbutrin. He is smoking about 3 cigarettes. Formerly smoking 10 cigarettes a day.  Pt is on  metformin. Pt a1c has been good and only mild high random sugar in past on cmp.CMP showed good kidney function in past.    Review of Systems  Constitutional: Negative for chills, fatigue and fever.  Respiratory: Negative for cough, chest tightness, shortness of breath and wheezing.   Cardiovascular: Negative for chest pain and palpitations.  Gastrointestinal: Negative for abdominal distention, abdominal pain, blood in stool, diarrhea, nausea, rectal pain and vomiting.  Musculoskeletal: Negative for back pain.  Skin: Negative for pallor and rash.  Neurological: Negative for dizziness, speech difficulty, weakness and headaches.  Hematological: Negative for adenopathy. Does not bruise/bleed easily.  Psychiatric/Behavioral: Negative for behavioral problems, decreased concentration, sleep disturbance and suicidal ideas.       He has good mood presently.    No past medical history on file.   Social History   Socioeconomic History  . Marital status: Single    Spouse name: Not on file  . Number of children: Not on file  . Years of education: Not on file  . Highest education level: Not on file  Occupational History  . Not on file  Social Needs  . Financial resource strain: Not on file  . Food insecurity:    Worry: Not on file    Inability: Not on file  . Transportation needs:    Medical: Not on file    Non-medical: Not on file  Tobacco Use  . Smoking status: Current Every Day  Smoker    Packs/day: 2.00    Types: Cigarettes  . Smokeless tobacco: Never Used  Substance and Sexual Activity  . Alcohol use: No    Alcohol/week: 0.0 oz    Comment: rare alcohol  . Drug use: No  . Sexual activity: Not on file  Lifestyle  . Physical activity:    Days per week: Not on file    Minutes per session: Not on file  . Stress: Not on file  Relationships  . Social connections:    Talks on phone: Not on file    Gets together: Not on file    Attends religious service: Not on file    Active member of club or organization: Not on file    Attends meetings of clubs or organizations: Not on file    Relationship status: Not on file  . Intimate partner violence:    Fear of current or ex partner: Not on file    Emotionally abused: Not on file    Physically abused: Not on file    Forced sexual activity: Not on file  Other Topics Concern  . Not on file  Social History Narrative  . Not on file    No past surgical history on file.  Family History  Problem Relation Age of Onset  . Diabetes Mother   . Diabetes Father   . Prostate cancer Father     Allergies  Allergen Reactions  . Clozapine Other (  See Comments)  . Mango Flavor     Mango; [place]: Patient has no known reaction to report. Mom states he just don't like.    Current Outpatient Medications on File Prior to Visit  Medication Sig Dispense Refill  . benztropine (COGENTIN) 0.5 MG tablet Take 0.5 mg by mouth 2 (two) times daily.    Marland Kitchen. buPROPion (ZYBAN) 150 MG 12 hr tablet     . LATUDA 80 MG TABS tablet 80 mg 2 (two) times daily.     Marland Kitchen. lithium carbonate (ESKALITH) 450 MG CR tablet Take 900 mg by mouth daily.     . metFORMIN (GLUCOPHAGE) 500 MG tablet Take 1 tablet (500 mg total) by mouth 2 (two) times daily with a meal. 28 tablet 0  . metFORMIN (GLUCOPHAGE) 500 MG tablet TAKE 1 TABLET BY MOUTH TWICE DAILY WITH A MEAL 180 tablet 0  . Misc Natural Products (FOCUSED MIND PO) Take 4 capsules by mouth 2 (two) times  daily.     . Multiple Vitamins-Minerals (MULTI COMPLETE PO) Take 2 each by mouth 1 day or 1 dose. Two Chews Daily.    Marland Kitchen. OLANZapine (ZYPREXA) 15 MG tablet Take 30 mg by mouth at bedtime.      No current facility-administered medications on file prior to visit.     BP 110/81   Pulse 96   Temp 98.2 F (36.8 C) (Oral)   Resp 16   Ht 6' (1.829 m)   Wt 169 lb 3.2 oz (76.7 kg)   SpO2 99%   BMI 22.95 kg/m       Objective:   Physical Exam  General Mental Status- Alert. General Appearance- Not in acute distress.   Skin General: Color- Normal Color. Moisture- Normal Moisture.  Neck Carotid Arteries- Normal color. Moisture- Normal Moisture. No carotid bruits. No JVD.  Chest and Lung Exam Auscultation: Breath Sounds:-Normal.  Cardiovascular Auscultation:Rythm- Regular. Murmurs & Other Heart Sounds:Auscultation of the heart reveals- No Murmurs.  Abdomen Inspection:-Inspeection Normal. Palpation/Percussion:Note:No mass. Palpation and Percussion of the abdomen reveal- Non Tender, Non Distended + BS, no rebound or guarding.  Neurologic Cranial Nerve exam:- CN III-XII intact(No nystagmus), symmetric smile. Strength:- 5/5 equal and symmetric strength both upper and lower extremities.      Assessment & Plan:  I am glad to hear that you had decreased your number of cigarettes down to 3 cigarettes a day.  Continue Wellbutrin but please try to taper off those remaining 3 cigarettes.  This will be beneficial for your long-term health/lung function.  Your mood is stable and would recommend continuing treatment/meds prescribed by psychiatrist.  Your sugar has been historically controlled and would recommend that you continue metformin.  We will get A1c when you get fasting physical exam labs.  Follow-up in 10 to 14 days for fasting CPE.  Recommend that you schedule early morning 8AM.  Esperanza RichtersEdward Trivia Heffelfinger, PA-C

## 2017-07-24 NOTE — Progress Notes (Deleted)
Subjective:   James [name] is a 37 y.o. male who presents for an Initial Medicare Annual Wellness Visit.  Review of Systems No ROS.  Medicare Wellness Visit. Additional risk factors are reflected in the social history.   Sleep patterns:   Home Safety/Smoke Alarms: Feels safe in home. Smoke alarms in place.  Living environment; residence and Firearm Safety:    Male:   CCS-n/a     PSA- No results found for: PSA     Objective:    There were no vitals filed for this visit. There is no height or weight on file to calculate BMI.  No flowsheet data found.  Current Medications (verified) Outpatient Encounter Medications as of 07/31/2017  Medication Sig  . benztropine (COGENTIN) 0.5 MG tablet Take 0.5 mg by mouth 2 (two) times daily.  Marland Kitchen buPROPion (ZYBAN) 150 MG 12 hr tablet   . LATUDA 80 MG TABS tablet 80 mg 2 (two) times daily.   Marland Kitchen lithium carbonate (ESKALITH) 450 MG CR tablet Take 900 mg by mouth daily.   . metFORMIN (GLUCOPHAGE) 500 MG tablet Take 1 tablet (500 mg total) by mouth 2 (two) times daily with a meal.  . metFORMIN (GLUCOPHAGE) 500 MG tablet TAKE 1 TABLET BY MOUTH TWICE DAILY WITH A MEAL  . Misc Natural Products (FOCUSED MIND PO) Take 4 capsules by mouth 2 (two) times daily.   . Multiple Vitamins-Minerals (MULTI COMPLETE PO) Take 2 each by mouth 1 day or 1 dose. Two Chews Daily.  Marland Kitchen OLANZapine (ZYPREXA) 15 MG tablet Take 30 mg by mouth at bedtime.    No facility-administered encounter medications on file as of 07/31/2017.     Allergies (verified) Clozapine and Mango flavor   History: No past medical history on file. No past surgical history on file. Family History  Problem Relation Age of Onset  . Diabetes Mother   . Diabetes Father   . Prostate cancer Father    Social History   Socioeconomic History  . Marital status: Single    Spouse name: Not on file  . Number of children: Not on file  . Years of education: Not on file  . Highest education level:  Not on file  Occupational History  . Not on file  Social Needs  . Financial resource strain: Not on file  . Food insecurity:    Worry: Not on file    Inability: Not on file  . Transportation needs:    Medical: Not on file    Non-medical: Not on file  Tobacco Use  . Smoking status: Current Every Day Smoker    Packs/day: 2.00    Types: Cigarettes  . Smokeless tobacco: Never Used  Substance and Sexual Activity  . Alcohol use: No    Alcohol/week: 0.0 oz    Comment: rare alcohol  . Drug use: No  . Sexual activity: Not on file  Lifestyle  . Physical activity:    Days per week: Not on file    Minutes per session: Not on file  . Stress: Not on file  Relationships  . Social connections:    Talks on phone: Not on file    Gets together: Not on file    Attends religious service: Not on file    Active member of club or organization: Not on file    Attends meetings of clubs or organizations: Not on file    Relationship status: Not on file  Other Topics Concern  . Not on file  Social History Narrative  . Not on file   Tobacco Counseling Ready to quit: Not Answered Counseling given: Not Answered   Clinical Intake:                       Activities of Daily Living No flowsheet data found.   Immunizations and Health Maintenance Immunization History  Administered Date(s) Administered  . Influenza,inj,Quad PF,6+ Mos 03/22/2016, 10/31/2016  . Tdap 08/31/2015   Health Maintenance Due  Topic Date Due  . HIV Screening  03/25/1994    Patient Care Team: Saguier, Kateri McEdward, PA-C as PCP - General (Internal Medicine)  Indicate any recent Medical Services you may have received from other than [facility] providers in the past year (date may be approximate).    Assessment:   This is a routine wellness examination for James [name]. Physical assessment deferred to PCP.  Hearing/Vision screen No exam data present  Dietary issues and exercise activities discussed:   Diet (meal  preparation, eat out, water intake, caffeinated beverages, dairy products, fruits and vegetables): {Desc; diets:16563} Breakfast: Lunch:  Dinner:      Goals    None     Depression Screen PHQ 2/9 Scores 01/21/2017 10/31/2016  PHQ - 2 Score 0 3  PHQ- 9 Score - 11    Fall Risk No flowsheet data found.  Cognitive Function:        Screening Tests Health Maintenance  Topic Date Due  . HIV Screening  03/25/1994  . INFLUENZA VACCINE  09/04/2017  . TETANUS/TDAP  08/30/2025       Plan:   ***  I have personally reviewed and noted the following in the patient's chart:   . Medical and social history . Use of alcohol, tobacco or illicit drugs  . Current medications and supplements . Functional ability and status . Nutritional status . Physical activity . Advanced directives . List of other physicians . Hospitalizations, surgeries, and ER visits in previous 12 months . Vitals . Screenings to include cognitive, depression, and falls . Referrals and appointments  In addition, I have reviewed and discussed with patient certain preventive protocols, quality metrics, and best practice recommendations. A written personalized care plan for preventive services as well as general preventive health recommendations were provided to patient.     Avon GullyBritt, Larrie Fraizer Angel, [place]   07/24/2017

## 2017-07-31 ENCOUNTER — Ambulatory Visit: Payer: Medicare Other | Admitting: Medical

## 2017-07-31 ENCOUNTER — Ambulatory Visit: Payer: Medicare Other | Admitting: *Deleted

## 2017-07-31 NOTE — Progress Notes (Addendum)
Subjective:   James [name] is a 37 y.o. male who presents for Medicare Annual/Subsequent preventive examination.  Pt is accompanied by mother.  Review of Systems: No ROS.  Medicare Wellness Visit. Additional risk factors are reflected in the social history.    Sleep patterns: Sleeps 10 hrs per night. Home Safety/Smoke Alarms: Feels safe in home. Smoke alarms in place.  Living environment; residence and Firearm Safety: 1 story. Lives with mother, father, and nephew.    Male:   CCS- n/a  PSA- No results found for: PSA      Objective:    Vitals: BP 120/74 (BP Location: Left Arm, Patient Position: Sitting, Cuff Size: Normal)   Pulse 94   Ht 6' (1.829 m)   Wt 170 lb (77.1 kg)   SpO2 97%   BMI 23.06 kg/m   Body mass index is 23.06 kg/m.  Advanced Directives 08/05/2017  Does Patient Have a Medical Advance Directive? No  Would patient like information on creating a medical advance directive? No - Patient declined    Tobacco Social History   Tobacco Use  Smoking Status Current Every Day Smoker  . Packs/day: 0.50  . Types: Cigarettes  Smokeless Tobacco Never Used     Ready to quit: Yes Counseling given: Yes   Clinical Intake: Pain : No/denies pain   History reviewed. No pertinent past medical history. History reviewed. No pertinent surgical history. Family History  Problem Relation Age of Onset  . Diabetes Mother   . Diabetes Father   . Prostate cancer Father    Social History   Socioeconomic History  . Marital status: Single    Spouse name: Not on file  . Number of children: Not on file  . Years of education: Not on file  . Highest education level: Not on file  Occupational History  . Not on file  Social Needs  . Financial resource strain: Not on file  . Food insecurity:    Worry: Not on file    Inability: Not on file  . Transportation needs:    Medical: Not on file    Non-medical: Not on file  Tobacco Use  . Smoking status: Current Every  Day Smoker    Packs/day: 0.50    Types: Cigarettes  . Smokeless tobacco: Never Used  Substance and Sexual Activity  . Alcohol use: No    Alcohol/week: 0.0 oz    Comment: rare alcohol  . Drug use: No  . Sexual activity: Yes    Comment: male  Lifestyle  . Physical activity:    Days per week: Not on file    Minutes per session: Not on file  . Stress: Not on file  Relationships  . Social connections:    Talks on phone: Not on file    Gets together: Not on file    Attends religious service: Not on file    Active member of club or organization: Not on file    Attends meetings of clubs or organizations: Not on file    Relationship status: Not on file  Other Topics Concern  . Not on file  Social History Narrative  . Not on file    Outpatient Encounter Medications as of 08/05/2017  Medication Sig  . benztropine (COGENTIN) 0.5 MG tablet Take 0.5 mg by mouth 2 (two) times daily.  Marland Kitchen. buPROPion (ZYBAN) 150 MG 12 hr tablet   . LATUDA 80 MG TABS tablet 80 mg 2 (two) times daily.   Marland Kitchen. lithium carbonate (  ESKALITH) 450 MG CR tablet Take 900 mg by mouth daily.   . metFORMIN (GLUCOPHAGE) 500 MG tablet Take 1 tablet (500 mg total) by mouth 2 (two) times daily with a meal.  . Misc Natural Products (FOCUSED MIND PO) Take 4 capsules by mouth 2 (two) times daily.   . Multiple Vitamins-Minerals (MULTI COMPLETE PO) Take 2 each by mouth 1 day or 1 dose. Two Chews Daily.  Marland Kitchen OLANZapine (ZYPREXA) 15 MG tablet Take 30 mg by mouth at bedtime.   . [DISCONTINUED] metFORMIN (GLUCOPHAGE) 500 MG tablet TAKE 1 TABLET BY MOUTH TWICE DAILY WITH A MEAL   No facility-administered encounter medications on file as of 08/05/2017.     Activities of Daily Living In your present state of health, do you have any difficulty performing the following activities: 08/05/2017  Hearing? N  Vision? N  Difficulty concentrating or making decisions? N  Walking or climbing stairs? N  Dressing or bathing? N  Doing errands, shopping?  N  Comment Has not driven since 2010.  Preparing Food and eating ? N  Using the Toilet? N  In the past six months, have you accidently leaked urine? N  Do you have problems with loss of bowel control? N  Managing your Medications? N  Managing your Finances? N  Housekeeping or managing your Housekeeping? N  Some recent data might be hidden    Patient Care Team: Saguier, [name] as PCP - General (Internal Medicine)   Assessment:   This is a routine wellness examination for James [name]. Physical assessment deferred to PCP.  Exercise Activities and Dietary recommendations Current Exercise Habits: The patient does not participate in regular exercise at present, Exercise limited by: None identified Diet (meal preparation, eat out, water intake, caffeinated beverages, dairy products, fruits and vegetables): in general, a "healthy" diet  , well balanced     Goals    . Go back to cosmotology today.       Depression Screen PHQ 2/9 Scores 08/05/2017 01/21/2017 10/31/2016  PHQ - 2 Score 0 0 3  PHQ- 9 Score - - 11    Cognitive Function MMSE - Mini Mental State Exam 08/05/2017  Orientation to time 5  Orientation to Place 5  Registration 3  Attention/ Calculation 5  Recall 3  Language- name 2 objects 2  Language- repeat 1  Language- follow 3 step command 3  Language- read & follow direction 1  Write a sentence 1  Copy design 1  Total score 30        Immunization History  Administered Date(s) Administered  . Influenza,inj,Quad PF,6+ Mos 03/22/2016, 10/31/2016  . Tdap 08/31/2015     Screening Tests Health Maintenance  Topic Date Due  . HIV Screening  03/25/1994  . INFLUENZA VACCINE  09/04/2017  . TETANUS/TDAP  08/30/2025      Plan:    Please schedule your next medicare wellness visit with me in 1 yr.  Continue to eat heart healthy diet (full of fruits, vegetables, whole grains, lean protein, water--limit salt, fat, and sugar intake) and increase physical activity as  tolerated.  Continue doing brain stimulating activities (puzzles, reading, adult coloring books, staying active) to keep memory sharp.   I have personally reviewed and noted the following in the patient's chart:   . Medical and social history . Use of alcohol, tobacco or illicit drugs  . Current medications and supplements . Functional ability and status . Nutritional status . Physical activity . Advanced directives . List of other  physicians . Hospitalizations, surgeries, and ER visits in previous 12 months . Vitals . Screenings to include cognitive, depression, and falls . Referrals and appointments  In addition, I have reviewed and discussed with patient certain preventive protocols, quality metrics, and best practice recommendations. A written personalized care plan for preventive services as well as general preventive health recommendations were provided to patient.     Avon Gully, [place]  08/05/2017  Reviwed and agree with Assessment & Plan of RN.  Esperanza Richters, PA-C

## 2017-08-05 ENCOUNTER — Encounter: Payer: Self-pay | Admitting: *Deleted

## 2017-08-05 ENCOUNTER — Encounter: Payer: Self-pay | Admitting: Medical

## 2017-08-05 ENCOUNTER — Ambulatory Visit (INDEPENDENT_AMBULATORY_CARE_PROVIDER_SITE_OTHER): Payer: Medicare Other | Admitting: *Deleted

## 2017-08-05 ENCOUNTER — Ambulatory Visit (INDEPENDENT_AMBULATORY_CARE_PROVIDER_SITE_OTHER): Payer: Medicare Other | Admitting: Medical

## 2017-08-05 VITALS — BP 120/74 | HR 94 | Ht 72.0 in | Wt 170.0 lb

## 2017-08-05 VITALS — BP 109/77 | HR 88 | Temp 97.9°F | Resp 16 | Ht 72.0 in | Wt 169.6 lb

## 2017-08-05 DIAGNOSIS — Z8342 Family history of familial hypercholesterolemia: Secondary | ICD-10-CM | POA: Diagnosis not present

## 2017-08-05 DIAGNOSIS — D72818 Other decreased white blood cell count: Secondary | ICD-10-CM | POA: Diagnosis not present

## 2017-08-05 DIAGNOSIS — Z0000 Encounter for general adult medical examination without abnormal findings: Secondary | ICD-10-CM

## 2017-08-05 DIAGNOSIS — R739 Hyperglycemia, unspecified: Secondary | ICD-10-CM

## 2017-08-05 LAB — CBC WITH DIFFERENTIAL/PLATELET
BASOS PCT: 0.6 % (ref 0.0–3.0)
Basophils Absolute: 0 10*3/uL (ref 0.0–0.1)
EOS ABS: 0.2 10*3/uL (ref 0.0–0.7)
Eosinophils Relative: 2.7 % (ref 0.0–5.0)
HCT: 43.7 % (ref 39.0–52.0)
HEMOGLOBIN: 14.4 g/dL (ref 13.0–17.0)
Lymphocytes Relative: 28.6 % (ref 12.0–46.0)
Lymphs Abs: 1.7 10*3/uL (ref 0.7–4.0)
MCHC: 32.9 g/dL (ref 30.0–36.0)
MCV: 91.6 fl (ref 78.0–100.0)
MONO ABS: 0.5 10*3/uL (ref 0.1–1.0)
Monocytes Relative: 7.5 % (ref 3.0–12.0)
Neutro Abs: 3.7 10*3/uL (ref 1.4–7.7)
Neutrophils Relative %: 60.6 % (ref 43.0–77.0)
PLATELETS: 243 10*3/uL (ref 150.0–400.0)
RBC: 4.77 Mil/uL (ref 4.22–5.81)
RDW: 14.6 % (ref 11.5–15.5)
WBC: 6.1 10*3/uL (ref 4.0–10.5)

## 2017-08-05 LAB — COMPREHENSIVE METABOLIC PANEL
ALBUMIN: 4.6 g/dL (ref 3.5–5.2)
ALT: 39 U/L (ref 0–53)
AST: 16 U/L (ref 0–37)
Alkaline Phosphatase: 57 U/L (ref 39–117)
BUN: 10 mg/dL (ref 6–23)
CHLORIDE: 108 meq/L (ref 96–112)
CO2: 28 meq/L (ref 19–32)
CREATININE: 1.16 mg/dL (ref 0.40–1.50)
Calcium: 10.1 mg/dL (ref 8.4–10.5)
GFR: 90.44 mL/min (ref >60.00)
GLUCOSE: 97 mg/dL (ref 70–99)
Potassium: 4.6 mEq/L (ref 3.5–5.1)
SODIUM: 141 meq/L (ref 135–145)
Total Bilirubin: 0.4 mg/dL (ref 0.2–1.2)
Total Protein: 7 g/dL (ref 6.0–8.3)

## 2017-08-05 LAB — LIPID PANEL
CHOLESTEROL: 166 mg/dL (ref 0–200)
HDL: 51.1 mg/dL (ref >39.00)
LDL Cholesterol: 95 mg/dL (ref 0–99)
NONHDL: 114.56
Total CHOL/HDL Ratio: 3
Triglycerides: 99 mg/dL (ref 0.0–149.0)
VLDL: 19.8 mg/dL (ref 0.0–40.0)

## 2017-08-05 LAB — HEMOGLOBIN A1C: Hgb A1c MFr Bld: 5.6 % (ref 4.6–6.5)

## 2017-08-05 NOTE — Patient Instructions (Addendum)
Please schedule your next medicare wellness visit with me in 1 yr.  Continue to eat heart healthy diet (full of fruits, vegetables, whole grains, lean protein, water--limit salt, fat, and sugar intake) and increase physical activity as tolerated.  Continue doing brain stimulating activities (puzzles, reading, adult coloring books, staying active) to keep memory sharp.    [name] , Thank you for taking time to come for your Medicare Wellness Visit. I appreciate your ongoing commitment to your health goals. Please review the following plan we discussed and let me know if I can assist you in the future.   These are the goals we discussed: Goals    . Go  to [employer]       This is a list of the screening recommended for you and due dates:  Health Maintenance  Topic Date Due  . HIV Screening  03/25/1994  . Flu Shot  09/04/2017  . Tetanus Vaccine  08/30/2025    Health Maintenance, Male A healthy lifestyle and preventive care is important for your health and wellness. Ask your health care provider about what schedule of regular examinations is right for you. What should I know about weight and diet? Eat a Healthy Diet  Eat plenty of vegetables, fruits, whole grains, low-fat dairy products, and lean protein.  Do not eat a lot of foods high in solid fats, added sugars, or salt.  Maintain a Healthy Weight Regular exercise can help you achieve or maintain a healthy weight. You should:  Do at least 150 minutes of exercise each week. The exercise should increase your heart rate and make you sweat (moderate-intensity exercise).  Do strength-training exercises at least twice a week.  Watch Your Levels of Cholesterol and Blood Lipids  Have your blood tested for lipids and cholesterol every 5 years starting at 38 years of age. If you are at high risk for heart disease, you should start having your blood tested when you are 38 years old. You may need to have your cholesterol  levels checked more often if: ? Your lipid or cholesterol levels are high. ? You are older than 38 years of age. ? You are at high risk for heart disease.  What should I know about cancer screening? Many types of cancers can be detected early and may often be prevented. Lung Cancer  You should be screened every year for lung cancer if: ? You are a current smoker who has smoked for at least 30 years. ? You are a former smoker who has quit within the past 15 years.  Talk to your health care provider about your screening options, when you should start screening, and how often you should be screened.  Colorectal Cancer  Routine colorectal cancer screening usually begins at 38 years of age and should be repeated every 5-10 years until you are 38 years old. You may need to be screened more often if early forms of precancerous polyps or small growths are found. Your health care provider may recommend screening at an earlier age if you have risk factors for colon cancer.  Your health care provider may recommend using home test kits to check for hidden blood in the stool.  A small camera at the end of a tube can be used to examine your colon (sigmoidoscopy or colonoscopy). This checks for the earliest forms of colorectal cancer.  Prostate and Testicular Cancer  Depending on your age and overall health, your health care provider may do certain tests to screen  for prostate and testicular cancer.  Talk to your health care provider about any symptoms or concerns you have about testicular or prostate cancer.  Skin Cancer  Check your skin from head to toe regularly.  Tell your health care provider about any new moles or changes in moles, especially if: ? There is a change in a mole's size, shape, or color. ? You have a mole that is larger than a pencil eraser.  Always use sunscreen. Apply sunscreen liberally and repeat throughout the day.  Protect yourself by wearing long sleeves, pants, a  wide-brimmed hat, and sunglasses when outside.  What should I know about heart disease, diabetes, and high blood pressure?  If you are 75-86 years of age, have your blood pressure checked every 3-5 years. If you are 63 years of age or older, have your blood pressure checked every year. You should have your blood pressure measured twice-once when you are at a hospital or clinic, and once when you are not at a hospital or clinic. Record the average of the two measurements. To check your blood pressure when you are not at a hospital or clinic, you can use: ? An automated blood pressure machine at a pharmacy. ? A home blood pressure monitor.  Talk to your health care provider about your target blood pressure.  If you are between 37-71 years old, ask your health care provider if you should take aspirin to prevent heart disease.  Have regular diabetes screenings by checking your fasting blood sugar level. ? If you are at a normal weight and have a low risk for diabetes, have this test once every three years after the age of 27. ? If you are overweight and have a high risk for diabetes, consider being tested at a younger age or more often.  A one-time screening for abdominal aortic aneurysm (AAA) by ultrasound is recommended for men aged 9-75 years who are current or former smokers. What should I know about preventing infection? Hepatitis B If you have a higher risk for hepatitis B, you should be screened for this virus. Talk with your health care provider to find out if you are at risk for hepatitis B infection. Hepatitis C Blood testing is recommended for:  Everyone born from [birth year] through 1966.  Anyone with known risk factors for hepatitis C.  Sexually Transmitted Diseases (STDs)  You should be screened each year for STDs including gonorrhea and chlamydia if: ? You are sexually active and are younger than 38 years of age. ? You are older than 38 years of age and your health care provider  tells you that you are at risk for this type of infection. ? Your sexual activity has changed since you were last screened and you are at an increased risk for chlamydia or gonorrhea. Ask your health care provider if you are at risk.  Talk with your health care provider about whether you are at high risk of being infected with HIV. Your health care provider may recommend a prescription medicine to help prevent HIV infection.  What else can I do?  Schedule regular health, dental, and eye exams.  Stay current with your vaccines (immunizations).  Do not use any tobacco products, such as cigarettes, chewing tobacco, and e-cigarettes. If you need help quitting, ask your health care provider.  Limit alcohol intake to no more than 2 drinks per day. One drink equals 12 ounces of beer, 5 ounces of wine, or 1 ounces of hard liquor.  Do  not use street drugs.  Do not share needles.  Ask your health care provider for help if you need support or information about quitting drugs.  Tell your health care provider if you often feel depressed.  Tell your health care provider if you have ever been abused or do not feel safe at home. This information is not intended to replace advice given to you by your health care provider. Make sure you discuss any questions you have with your health care provider. Document Released: 07/20/2007 Document Revised: 09/20/2015 Document Reviewed: 10/25/2014 Elsevier Interactive Patient Education  2018 [employer] Inc[employer]  

## 2017-08-05 NOTE — Progress Notes (Signed)
Subjective:    Patient ID: James [name], male    DOB: 12-12-1980, 37 y.o.   MRN: [id number]  HPI    Pt in for follow up.  I had asked pt to come in for CPE. Had idea to check pt lipid panel under screening but pt has medicare(note on schedule indicate not cpe due to medicare(. We called pt dad and he indicates history of high cholesterol. Pt did come in fasting for visit today/labs.)  On review of prior cbc he had low wbc one time in past and transient low neutrophil. No fever, no chills, no sweats are reported.  In past had one lab with elevated blood sugar.      Review of Systems  Constitutional: Negative for chills, fatigue and unexpected weight change.  Respiratory: Negative for chest tightness, shortness of breath and wheezing.   Gastrointestinal: Negative for abdominal pain, constipation, diarrhea and vomiting.  Musculoskeletal: Negative for back pain and joint swelling.  Skin: Negative for rash.  Neurological: Negative for dizziness and facial asymmetry.  Hematological: Negative for adenopathy. Does not bruise/bleed easily.  Psychiatric/Behavioral: Negative for behavioral problems, confusion, dysphoric mood and suicidal ideas. The patient is not nervous/anxious.     No past medical history on file.   Social History   Socioeconomic History  . Marital status: Single    Spouse name: Not on file  . Number of children: Not on file  . Years of education: Not on file  . Highest education level: Not on file  Occupational History  . Not on file  Social Needs  . Financial resource strain: Not on file  . Food insecurity:    Worry: Not on file    Inability: Not on file  . Transportation needs:    Medical: Not on file    Non-medical: Not on file  Tobacco Use  . Smoking status: Current Every Day Smoker    Packs/day: 2.00    Types: Cigarettes  . Smokeless tobacco: Never Used  Substance and Sexual Activity  . Alcohol use: No    Alcohol/week: 0.0 oz    Comment:  rare alcohol  . Drug use: No  . Sexual activity: Not on file  Lifestyle  . Physical activity:    Days per week: Not on file    Minutes per session: Not on file  . Stress: Not on file  Relationships  . Social connections:    Talks on phone: Not on file    Gets together: Not on file    Attends religious service: Not on file    Active member of club or organization: Not on file    Attends meetings of clubs or organizations: Not on file    Relationship status: Not on file  . Intimate partner violence:    Fear of current or ex partner: Not on file    Emotionally abused: Not on file    Physically abused: Not on file    Forced sexual activity: Not on file  Other Topics Concern  . Not on file  Social History Narrative  . Not on file    No past surgical history on file.  Family History  Problem Relation Age of Onset  . Diabetes Mother   . Diabetes Father   . Prostate cancer Father     Allergies  Allergen Reactions  . Clozapine Other (See Comments)  . Mango Flavor     Mango; [place]: Patient has no known reaction to report. Mom states he just  don't like.    Current Outpatient Medications on File Prior to Visit  Medication Sig Dispense Refill  . benztropine (COGENTIN) 0.5 MG tablet Take 0.5 mg by mouth 2 (two) times daily.    Marland Kitchen buPROPion (ZYBAN) 150 MG 12 hr tablet     . LATUDA 80 MG TABS tablet 80 mg 2 (two) times daily.     Marland Kitchen lithium carbonate (ESKALITH) 450 MG CR tablet Take 900 mg by mouth daily.     . metFORMIN (GLUCOPHAGE) 500 MG tablet Take 1 tablet (500 mg total) by mouth 2 (two) times daily with a meal. 28 tablet 0  . metFORMIN (GLUCOPHAGE) 500 MG tablet TAKE 1 TABLET BY MOUTH TWICE DAILY WITH A MEAL 180 tablet 0  . Misc Natural Products (FOCUSED MIND PO) Take 4 capsules by mouth 2 (two) times daily.     . Multiple Vitamins-Minerals (MULTI COMPLETE PO) Take 2 each by mouth 1 day or 1 dose. Two Chews Daily.    Marland Kitchen OLANZapine (ZYPREXA) 15 MG tablet Take 30 mg by mouth  at bedtime.      No current facility-administered medications on file prior to visit.     BP 109/77   Pulse 88   Temp 97.9 F (36.6 C) (Oral)   Resp 16   Ht 6' (1.829 m)   Wt 169 lb 9.6 oz (76.9 kg)   SpO2 100%   BMI 23.00 kg/m       Objective:   Physical Exam  General- No acute distress. Pleasant patient. Neck- Full range of motion, no jvd Lungs- Clear, even and unlabored. Heart- regular rate and rhythm. Neurologic- CNII- XII grossly intact.       Assessment & Plan:  You had normal physical exam today.  On review of family history, your medical history and review of prior labs decided to order CMP, CBC, lipid panel and A1c.  You are on lithium prescribed by your psychiatrist.  Prior checks of TSH/thyroid function have been normal.  Most recently in March.  Plan to check that on annual basis.  Sooner if indicated by signs or symptoms.  Follow-up date to be determined after lab review.  Esperanza Richters, PA-C

## 2017-08-05 NOTE — Patient Instructions (Signed)
You had normal physical exam today.  On review of family history, your medical history and review of prior labs decided to order CMP, CBC, lipid panel and A1c.  You are on lithium prescribed by your psychiatrist.  Prior checks of TSH/thyroid function have been normal.  Most recently in March.  Plan to check that on annual basis.  Sooner if indicated by signs or symptoms.  Follow-up date to be determined after lab review.

## 2017-09-18 DIAGNOSIS — F209 Schizophrenia, unspecified: Secondary | ICD-10-CM | POA: Diagnosis not present

## 2017-09-18 DIAGNOSIS — F1211 Cannabis abuse, in remission: Secondary | ICD-10-CM | POA: Diagnosis not present

## 2017-10-14 ENCOUNTER — Other Ambulatory Visit: Payer: Self-pay | Admitting: Medical

## 2017-10-21 ENCOUNTER — Ambulatory Visit (INDEPENDENT_AMBULATORY_CARE_PROVIDER_SITE_OTHER): Payer: Medicare Other | Admitting: Medical

## 2017-10-21 ENCOUNTER — Ambulatory Visit (HOSPITAL_BASED_OUTPATIENT_CLINIC_OR_DEPARTMENT_OTHER)
Admission: RE | Admit: 2017-10-21 | Discharge: 2017-10-21 | Disposition: A | Discharge status: Home / Self Care | Payer: Medicare Other | Source: Ambulatory Visit | Attending: Medical | Admitting: Medical

## 2017-10-21 ENCOUNTER — Encounter: Payer: Self-pay | Admitting: Medical

## 2017-10-21 VITALS — BP 101/73 | HR 89 | Temp 98.8°F | Resp 16 | Ht 72.0 in | Wt 157.2 lb

## 2017-10-21 DIAGNOSIS — R1013 Epigastric pain: Secondary | ICD-10-CM

## 2017-10-21 DIAGNOSIS — Z87891 Personal history of nicotine dependence: Secondary | ICD-10-CM | POA: Insufficient documentation

## 2017-10-21 DIAGNOSIS — R1010 Upper abdominal pain, unspecified: Secondary | ICD-10-CM | POA: Diagnosis not present

## 2017-10-21 LAB — CBC WITH DIFFERENTIAL/PLATELET
BASOS PCT: 0.9 % (ref 0.0–3.0)
Basophils Absolute: 0.1 10*3/uL (ref 0.0–0.1)
EOS PCT: 2.1 % (ref 0.0–5.0)
Eosinophils Absolute: 0.1 10*3/uL (ref 0.0–0.7)
HEMATOCRIT: 43.5 % (ref 39.0–52.0)
Hemoglobin: 14.5 g/dL (ref 13.0–17.0)
LYMPHS PCT: 38.1 % (ref 12.0–46.0)
Lymphs Abs: 2.4 10*3/uL (ref 0.7–4.0)
MCHC: 33.2 g/dL (ref 30.0–36.0)
MCV: 89.8 fl (ref 78.0–100.0)
Monocytes Absolute: 0.5 10*3/uL (ref 0.1–1.0)
Monocytes Relative: 8 % (ref 3.0–12.0)
Neutro Abs: 3.2 10*3/uL (ref 1.4–7.7)
Neutrophils Relative %: 50.9 % (ref 43.0–77.0)
Platelets: 234 10*3/uL (ref 150.0–400.0)
RBC: 4.84 Mil/uL (ref 4.22–5.81)
RDW: 13.9 % (ref 11.5–15.5)
WBC: 6.3 10*3/uL (ref 4.0–10.5)

## 2017-10-21 LAB — COMPREHENSIVE METABOLIC PANEL
ALT: 15 U/L (ref 0–53)
AST: 14 U/L (ref 0–37)
Albumin: 4.6 g/dL (ref 3.5–5.2)
Alkaline Phosphatase: 62 U/L (ref 39–117)
BUN: 15 mg/dL (ref 6–23)
CALCIUM: 9.9 mg/dL (ref 8.4–10.5)
CO2: 25 mEq/L (ref 19–32)
Chloride: 108 mEq/L (ref 96–112)
Creatinine, Ser: 1.06 mg/dL (ref 0.40–1.50)
GFR: 100.25 mL/min (ref >60.00)
Glucose, Bld: 85 mg/dL (ref 70–99)
POTASSIUM: 4.3 meq/L (ref 3.5–5.1)
Sodium: 139 mEq/L (ref 135–145)
Total Bilirubin: 0.5 mg/dL (ref 0.2–1.2)
Total Protein: 7.1 g/dL (ref 6.0–8.3)

## 2017-10-21 LAB — LIPASE: Lipase: 17 U/L (ref 11.0–59.0)

## 2017-10-21 LAB — AMYLASE: Amylase: 59 U/L (ref 27–131)

## 2017-10-21 MED ORDER — RANITIDINE HCL 150 MG PO CAPS: 150.0000 mg | ORAL_CAPSULE | Freq: Two times a day (BID) | ORAL | 0 refills | Status: DC

## 2017-10-21 NOTE — Patient Instructions (Signed)
For your recent epigastric pain for 3 months, I do not think that it is medication related as you have been various medications for some time without prior reported side effects.  You you report some decreased eating due to the pain and describe possible reflux symptoms.  I do want you to start with medication called ranitidine and try to eat healthier as well as stop his drinking sodas.  Also if possible try to cut back further on your smoking.  We will get a CBC, CMP, amylase, lipase, H. pylori breath test and ifob.  You do have history of smoking and want to get a chest x-ray today.  Also decided to go ahead and order abdominal ultrasound.  When you were downstairs getting chest x-ray please get scheduled for the ultrasound.  Follow-up in 10 to 14 days or as needed.

## 2017-10-21 NOTE — Progress Notes (Signed)
Subjective:    Patient ID: James [name], male    DOB: 03-08-1980, 37 y.o.   MRN: [id number]  HPI  Pt in for follow up.  He states sometimes medications may be causing  upset stomach. He states he  is not sure which one might be bothering his stomach. He takes in the morning will take  wellbutrin, metformin and focus factor. He also takes some vitamins in morning.   In the evening he takes metformin, latuda, wellbutrin, lithium and zyprexa late in the day.   On discussion and review with mother he has been on all these medications for a while in past with no upset stomach. No new meds on review.  On further discussion he states feels like burning to stomach. He explains like  heart burn per pt. Some belching. He is eating less overall. He does like to eat fast food.He does like to drinks sodas. Has been complaining of upset stomach for about 3 months now.  He admits sometimes he  is eating less due to pain. Pt has lost some weight gradually over last 2 years.   Review of Systems  Respiratory: Negative for cough, chest tightness, shortness of breath and wheezing.   Cardiovascular: Negative for chest pain and palpitations.  Gastrointestinal: Positive for abdominal pain and vomiting. Negative for abdominal distention, blood in stool, constipation and nausea.       3 times over last 3 months vomited when he had pain. But no recent vomiting per pt.  Musculoskeletal: Negative for back pain.  Hematological: Negative for adenopathy. Does not bruise/bleed easily.  Psychiatric/Behavioral: Negative for behavioral problems and confusion.   No past medical history on file.   Social History   Socioeconomic History  . Marital status: Single    Spouse name: Not on file  . Number of children: Not on file  . Years of education: Not on file  . Highest education level: Not on file  Occupational History  . Not on file  Social Needs  . Financial resource strain: Not on file  . Food  insecurity:    Worry: Not on file    Inability: Not on file  . Transportation needs:    Medical: Not on file    Non-medical: Not on file  Tobacco Use  . Smoking status: Current Every Day Smoker    Packs/day: 0.50    Types: Cigarettes  . Smokeless tobacco: Never Used  Substance and Sexual Activity  . Alcohol use: No    Alcohol/week: 0.0 standard drinks    Comment: rare alcohol  . Drug use: No  . Sexual activity: Yes    Comment: male  Lifestyle  . Physical activity:    Days per week: Not on file    Minutes per session: Not on file  . Stress: Not on file  Relationships  . Social connections:    Talks on phone: Not on file    Gets together: Not on file    Attends religious service: Not on file    Active member of club or organization: Not on file    Attends meetings of clubs or organizations: Not on file    Relationship status: Not on file  . Intimate partner violence:    Fear of current or ex partner: Not on file    Emotionally abused: Not on file    Physically abused: Not on file    Forced sexual activity: Not on file  Other Topics Concern  . Not on  file  Social History Narrative  . Not on file    No past surgical history on file.  Family History  Problem Relation Age of Onset  . Diabetes Mother   . Diabetes Father   . Prostate cancer Father     Allergies  Allergen Reactions  . Clozapine Other (See Comments)  . Mango Flavor     Mango; [place]: Patient has no known reaction to report. Mom states he just don't like.    Current Outpatient Medications on File Prior to Visit  Medication Sig Dispense Refill  . benztropine (COGENTIN) 0.5 MG tablet Take 0.5 mg by mouth 2 (two) times daily.    Marland Kitchen. buPROPion (ZYBAN) 150 MG 12 hr tablet     . LATUDA 80 MG TABS tablet 80 mg 2 (two) times daily.     Marland Kitchen. lithium carbonate (ESKALITH) 450 MG CR tablet Take 900 mg by mouth daily.     . metFORMIN (GLUCOPHAGE) 500 MG tablet Take 1 tablet (500 mg total) by mouth 2 (two) times  daily with a meal. 28 tablet 0  . metFORMIN (GLUCOPHAGE) 500 MG tablet TAKE 1 TABLET BY MOUTH TWICE DAILY WITH MEALS **PATIENT  NEEDS  OFFICE  VISIT  FOR  FURTHER  REFILLS** 30 tablet 0  . Misc Natural Products (FOCUSED MIND PO) Take 4 capsules by mouth 2 (two) times daily.     . Multiple Vitamins-Minerals (MULTI COMPLETE PO) Take 2 each by mouth 1 day or 1 dose. Two Chews Daily.    Marland Kitchen. OLANZapine (ZYPREXA) 15 MG tablet Take 30 mg by mouth at bedtime.      No current facility-administered medications on file prior to visit.     BP 101/73   Pulse 89   Temp 98.8 F (37.1 C) (Oral)   Resp 16   Ht 6' (1.829 m)   Wt 157 lb 3.2 oz (71.3 kg)   SpO2 99%   BMI 21.32 kg/m       Objective:   Physical Exam  General Appearance- Not in acute distress.  HEENT Eyes- Scleraeral/Conjuntiva-bilat- Not Yellow. Mouth & Throat- Normal.  Chest and Lung Exam Auscultation: Breath sounds:-Normal. Adventitious sounds:- No Adventitious sounds.  Cardiovascular Auscultation:Rythm - Regular. Heart Sounds -Normal heart sounds.  Abdomen Inspection:-Inspection Normal.  Palpation/Perucssion: Palpation and Percussion of the abdomen reveal- faint epigastric  Tenderness, No Rebound tenderness, No rigidity(Guarding) and No Palpable abdominal masses.  Liver:-Normal.  Spleen:- Normal.   Back- no cva tenderness.      Assessment & Plan:  [phone]. For your recent epigastric pain for 3 months, I do not think that it is medication related as you have been various medications for some time without prior reported side effects.  You you report some decreased eating due to the pain and describe possible reflux symptoms.  I do want you to start with medication called ranitidine and try to eat healthier as well as stop his drinking sodas.  Also if possible try to cut back further on your smoking.  We will get a CBC, CMP, amylase, lipase, H. pylori breath test and ifob.  You do have history of smoking and want to  get a chest x-ray today.  Also decided to go ahead and order abdominal ultrasound.  When you were downstairs getting chest x-ray please get scheduled for the ultrasound.  Follow-up in 10 to 14 days or as needed.

## 2017-10-24 DIAGNOSIS — F209 Schizophrenia, unspecified: Secondary | ICD-10-CM | POA: Diagnosis not present

## 2017-10-24 DIAGNOSIS — F1211 Cannabis abuse, in remission: Secondary | ICD-10-CM | POA: Diagnosis not present

## 2017-10-27 LAB — H. PYLORI BREATH TEST

## 2017-10-28 ENCOUNTER — Telehealth: Payer: Self-pay | Admitting: Medical

## 2017-10-28 DIAGNOSIS — R1013 Epigastric pain: Secondary | ICD-10-CM

## 2017-10-28 NOTE — Telephone Encounter (Signed)
Future order h pylori placed.

## 2017-10-29 NOTE — Telephone Encounter (Signed)
Called pt left voicemail message he needs to schedule lab appt.

## 2017-10-30 ENCOUNTER — Ambulatory Visit: Payer: Medicare Other | Admitting: Medical

## 2017-10-30 ENCOUNTER — Ambulatory Visit (HOSPITAL_BASED_OUTPATIENT_CLINIC_OR_DEPARTMENT_OTHER): Payer: Medicare Other

## 2017-11-10 ENCOUNTER — Other Ambulatory Visit: Payer: Self-pay | Admitting: Medical

## 2017-11-13 ENCOUNTER — Other Ambulatory Visit (INDEPENDENT_AMBULATORY_CARE_PROVIDER_SITE_OTHER): Payer: Medicare Other

## 2017-11-13 ENCOUNTER — Ambulatory Visit (HOSPITAL_BASED_OUTPATIENT_CLINIC_OR_DEPARTMENT_OTHER)
Admission: RE | Admit: 2017-11-13 | Discharge: 2017-11-13 | Disposition: A | Discharge status: Home / Self Care | Payer: Medicare Other | Source: Ambulatory Visit | Attending: Medical | Admitting: Medical

## 2017-11-13 DIAGNOSIS — R1013 Epigastric pain: Secondary | ICD-10-CM | POA: Diagnosis not present

## 2017-11-13 DIAGNOSIS — R1010 Upper abdominal pain, unspecified: Secondary | ICD-10-CM | POA: Diagnosis not present

## 2017-11-14 LAB — H. PYLORI BREATH TEST: H. PYLORI BREATH TEST: NOT DETECTED

## 2017-11-25 DIAGNOSIS — K529 Noninfective gastroenteritis and colitis, unspecified: Secondary | ICD-10-CM | POA: Diagnosis not present

## 2017-11-25 DIAGNOSIS — F209 Schizophrenia, unspecified: Secondary | ICD-10-CM | POA: Insufficient documentation

## 2017-11-25 DIAGNOSIS — K219 Gastro-esophageal reflux disease without esophagitis: Secondary | ICD-10-CM

## 2017-11-25 HISTORY — DX: Gastro-esophageal reflux disease without esophagitis: K21.9

## 2017-12-10 DIAGNOSIS — F1211 Cannabis abuse, in remission: Secondary | ICD-10-CM | POA: Diagnosis not present

## 2017-12-10 DIAGNOSIS — F209 Schizophrenia, unspecified: Secondary | ICD-10-CM | POA: Diagnosis not present

## 2018-01-07 ENCOUNTER — Encounter: Payer: Self-pay | Admitting: Medical

## 2018-01-07 ENCOUNTER — Ambulatory Visit (INDEPENDENT_AMBULATORY_CARE_PROVIDER_SITE_OTHER): Payer: Medicare Other | Admitting: Medical

## 2018-01-07 VITALS — BP 111/67 | HR 83 | Temp 98.3°F | Resp 16 | Ht 72.0 in | Wt 151.4 lb

## 2018-01-07 DIAGNOSIS — B070 Plantar wart: Secondary | ICD-10-CM | POA: Diagnosis not present

## 2018-01-07 NOTE — Progress Notes (Signed)
   Subjective:    Patient ID: James [name], male    DOB: August 07, 1980, 37 y.o.   MRN: [id number]  HPI  Pt had some areas on feet that appeared small irregular and painful. He is not sure what they were but now wants the area evaluated and treated. Some in increase in size over past year.  Review of Systems  Skin: Negative for rash.       See hpi and physical       Objective:   Physical Exam  General - no acute distress. Feet/skin- on both great toe bottom aspect appear to have appearance 6 mm approximate plantar wart. On rt foot- bottom aspect. 6mm sized appearance of plantar wart as well.       Assessment & Plan:  You do appear to have 3 moderate sized plantar warts present.  I due to the number and features of these probable warts, I was considering podiatry referral versus dermatology referral.  Dermatology referrals have been delayed recently so made referral to podiatrist.  If you do not get a call from podiatry clinic or our office within 7 to 10 days please let us know.  Follow-up as regularly scheduled or as needed.  Counseled pt and mom on likely diagnosis and potential treatments.  Esperanza RichtersEdward Tayquan Gassman, PA-C

## 2018-01-07 NOTE — Patient Instructions (Signed)
You do appear to have 3 moderate sized plantar warts present.  I due to the number and features of these probable warts, I was considering podiatry referral versus dermatology referral.  Dermatology referrals have been delayed recently so made referral to podiatrist.  If you do not get a call from podiatry clinic or our office within 7 to 10 days please let us know.  Follow-up as regularly scheduled or as needed.

## 2018-01-20 ENCOUNTER — Encounter: Payer: Self-pay | Admitting: Sports Medicine

## 2018-01-20 ENCOUNTER — Ambulatory Visit (INDEPENDENT_AMBULATORY_CARE_PROVIDER_SITE_OTHER): Payer: Medicare Other | Admitting: Sports Medicine

## 2018-01-20 VITALS — BP 108/72 | HR 81

## 2018-01-20 DIAGNOSIS — Q828 Other specified congenital malformations of skin: Secondary | ICD-10-CM | POA: Diagnosis not present

## 2018-01-20 DIAGNOSIS — M79671 Pain in right foot: Secondary | ICD-10-CM | POA: Diagnosis not present

## 2018-01-20 DIAGNOSIS — M79672 Pain in left foot: Secondary | ICD-10-CM | POA: Diagnosis not present

## 2018-01-20 DIAGNOSIS — F209 Schizophrenia, unspecified: Secondary | ICD-10-CM | POA: Diagnosis not present

## 2018-01-20 MED ORDER — UREA 40 % EX OINT: TOPICAL_OINTMENT | CUTANEOUS | 0 refills | Status: DC | PRN

## 2018-01-20 NOTE — Progress Notes (Signed)
Subjective: Leafy Rodward E Savarese is a 37 y.o. male patient who presents to office for evaluation of Right> Left foot pain secondary to callus skin. Patient complains of pain at the lesion present Right>Left foot at the bottom of foot was told by PCP thinks it warts; Patient is assisted by mother who helps to report this history. Patient has not tried any treatments. Patient denies any other pedal complaints.   Review of Systems  Unable to perform ROS: Psychiatric disorder     Patient Active Problem List   Diagnosis Date Noted  . Gastroesophageal reflux disease 11/25/2017  . Schizophrenia (HCC) 11/25/2017    Current Outpatient Medications on File Prior to Visit  Medication Sig Dispense Refill  . benztropine (COGENTIN) 0.5 MG tablet Take 0.5 mg by mouth 2 (two) times daily.    Marland Kitchen. buPROPion (ZYBAN) 150 MG 12 hr tablet     . LATUDA 80 MG TABS tablet 80 mg 2 (two) times daily.     Marland Kitchen. lithium carbonate (ESKALITH) 450 MG CR tablet Take 900 mg by mouth daily.     . metFORMIN (GLUCOPHAGE) 500 MG tablet Take 1 tablet (500 mg total) by mouth 2 (two) times daily with a meal. 28 tablet 0  . metFORMIN (GLUCOPHAGE) 500 MG tablet TAKE 1 TABLET BY MOUTH TWICE DAILY WITH MEALS 180 tablet 0  . Misc Natural Products (FOCUSED MIND PO) Take 4 capsules by mouth 2 (two) times daily.     . Multiple Vitamins-Minerals (MULTI COMPLETE PO) Take 2 each by mouth 1 day or 1 dose. Two Chews Daily.     No current facility-administered medications on file prior to visit.     Allergies  Allergen Reactions  . Clozapine Other (See Comments)  . Mango Flavor     Mango; [place]: Patient has no known reaction to report. Mom states he just don't like.    Objective:  General:No acute distress  Dermatology: Keratotic lesion present plantar hallux bilateral and plantar midfoot with skin lines transversing the lesion, pain is present with direct pressure to the lesion with a central nucleated core noted, no webspace macerations,  no ecchymosis bilateral, all nails x 10 are thickened but well manicured.  Vascular: Dorsalis Pedis and Posterior Tibial pedal pulses 2/4, Capillary Fill Time 3 seconds, + pedal hair growth bilateral, no edema bilateral lower extremities, Temperature gradient within normal limits.  Neurology: Unable to discern due to mental status however patient verbal states that he can feel gross light touch  Musculoskeletal: Mild tenderness with palpation at the keratotic lesion site on Right>Left, Muscular strength 5/5 in all groups without pain or limitation on range of motion.+ pes planus deformity noted.  Assessment and Plan: Problem List Items Addressed This Visit      Other   Schizophrenia (HCC)    Other Visit Diagnoses    Porokeratosis    -  Primary   Relevant Medications   urea (GORDONS UREA) 40 % ointment   Foot pain, bilateral          -Complete examination performed -Discussed treatment options with mother  -Parred keratoic lesion using a chisel blade x3 ; treated the area withSalinocaine covered with moleskin -Encouraged daily skin emollients; Rx urea  -Advised good supportive shoes and inserts -Patient to return to office as needed or sooner if condition worsens.  Asencion Islamitorya Chelisa Hennen, DPM

## 2018-03-04 DIAGNOSIS — F1211 Cannabis abuse, in remission: Secondary | ICD-10-CM | POA: Diagnosis not present

## 2018-03-04 DIAGNOSIS — F209 Schizophrenia, unspecified: Secondary | ICD-10-CM | POA: Diagnosis not present

## 2018-03-24 ENCOUNTER — Other Ambulatory Visit: Payer: Self-pay

## 2018-03-24 MED ORDER — METFORMIN HCL 500 MG PO TABS: 500.0000 mg | ORAL_TABLET | Freq: Two times a day (BID) | ORAL | 0 refills | Status: DC

## 2018-03-25 ENCOUNTER — Ambulatory Visit (INDEPENDENT_AMBULATORY_CARE_PROVIDER_SITE_OTHER): Payer: Medicare Other | Admitting: Podiatry

## 2018-03-25 ENCOUNTER — Telehealth: Payer: Self-pay | Admitting: Medical

## 2018-03-25 ENCOUNTER — Encounter: Payer: Self-pay | Admitting: Podiatry

## 2018-03-25 DIAGNOSIS — Q828 Other specified congenital malformations of skin: Secondary | ICD-10-CM

## 2018-03-25 DIAGNOSIS — M79671 Pain in right foot: Secondary | ICD-10-CM | POA: Diagnosis not present

## 2018-03-25 DIAGNOSIS — M79672 Pain in left foot: Secondary | ICD-10-CM

## 2018-03-25 NOTE — Telephone Encounter (Signed)
Patient's legal guardian came by the office today to drop off a form for Social Security. Form has two questions that need to be filled out by [employer]. Please call Eliott Amor at [phone] when the form is completed.

## 2018-03-25 NOTE — Patient Instructions (Signed)
Corns and Calluses Corns are small areas of thickened skin that occur on the top, sides, or tip of a toe. They contain a cone-shaped core with a point that can press on a nerve below. This causes pain.  Calluses are areas of thickened skin that can occur anywhere on the body, including the hands, fingers, palms, soles of the feet, and heels. Calluses are usually larger than corns. What are the causes? Corns and calluses are caused by rubbing (friction) or pressure, such as from shoes that are too tight or do not fit properly. What increases the risk? Corns are more likely to develop in people who have misshapen toes (toe deformities), such as hammer toes. Calluses can occur with friction to any area of the skin. They are more likely to develop in people who:  Work with their hands.  Wear shoes that fit poorly, are too tight, or are high-heeled.  Have toe deformities. What are the signs or symptoms? Symptoms of a corn or callus include:  A hard growth on the skin.  Pain or tenderness under the skin.  Redness and swelling.  Increased discomfort while wearing tight-fitting shoes, if your feet are affected. If a corn or callus becomes infected, symptoms may include:  Redness and swelling that gets worse.  Pain.  Fluid, blood, or pus draining from the corn or callus. How is this diagnosed? Corns and calluses may be diagnosed based on your symptoms, your medical history, and a physical exam. How is this treated? Treatment for corns and calluses may include:  Removing the cause of the friction or pressure. This may involve: ? Changing your shoes. ? Wearing shoe inserts (orthotics) or other protective layers in your shoes, such as a corn pad. ? Wearing gloves.  Applying medicine to the skin (topical medicine) to help soften skin in the hardened, thickened areas.  Removing layers of dead skin with a file to reduce the size of the corn or callus.  Removing the corn or callus with a  scalpel or laser.  Taking antibiotic medicines, if your corn or callus is infected.  Having surgery, if a toe deformity is the cause. Follow these instructions at home:   Take over-the-counter and prescription medicines only as told by your health care provider.  If you were prescribed an antibiotic, take it as told by your health care provider. Do not stop taking it even if your condition starts to improve.  Wear shoes that fit well. Avoid wearing high-heeled shoes and shoes that are too tight or too loose.  Wear any padding, protective layers, gloves, or orthotics as told by your health care provider.  Soak your hands or feet and then use a file or pumice stone to soften your corn or callus. Do this as told by your health care provider.  Check your corn or callus every day for symptoms of infection. Contact a health care provider if you:  Notice that your symptoms do not improve with treatment.  Have redness or swelling that gets worse.  Notice that your corn or callus becomes painful.  Have fluid, blood, or pus coming from your corn or callus.  Have new symptoms. Summary  Corns are small areas of thickened skin that occur on the top, sides, or tip of a toe.  Calluses are areas of thickened skin that can occur anywhere on the body, including the hands, fingers, palms, and soles of the feet. Calluses are usually larger than corns.  Corns and calluses are caused by   rubbing (friction) or pressure, such as from shoes that are too tight or do not fit properly.  Treatment may include wearing any padding, protective layers, gloves, or orthotics as told by your health care provider. This information is not intended to replace advice given to you by your health care provider. Make sure you discuss any questions you have with your health care provider. Document Released: 10/28/2003 Document Revised: 12/04/2016 Document Reviewed: 12/04/2016 Elsevier Interactive Patient Education   2019 Elsevier Inc.  Onychomycosis/Fungal Toenails  WHAT IS IT? An infection that lies within the keratin of your nail plate that is caused by a fungus.  WHY ME? Fungal infections affect all ages, sexes, races, and creeds.  There may be many factors that predispose you to a fungal infection such as age, coexisting medical conditions such as diabetes, or an autoimmune disease; stress, medications, fatigue, genetics, etc.  Bottom line: fungus thrives in a warm, moist environment and your shoes offer such a location.  IS IT CONTAGIOUS? Theoretically, yes.  You do not want to share shoes, nail clippers or files with someone who has fungal toenails.  Walking around barefoot in the same room or sleeping in the same bed is unlikely to transfer the organism.  It is important to realize, however, that fungus can spread easily from one nail to the next on the same foot.  HOW DO WE TREAT THIS?  There are several ways to treat this condition.  Treatment may depend on many factors such as age, medications, pregnancy, liver and kidney conditions, etc.  It is best to ask your doctor which options are available to you.  1. No treatment.   Unlike many other medical concerns, you can live with this condition.  However for many people this can be a painful condition and may lead to ingrown toenails or a bacterial infection.  It is recommended that you keep the nails cut short to help reduce the amount of fungal nail. 2. Topical treatment.  These range from herbal remedies to prescription strength nail lacquers.  About 40-50% effective, topicals require twice daily application for approximately 9 to 12 months or until an entirely new nail has grown out.  The most effective topicals are medical grade medications available through physicians offices. 3. Oral antifungal medications.  With an 80-90% cure rate, the most common oral medication requires 3 to 4 months of therapy and stays in your system for a year as the new nail  grows out.  Oral antifungal medications do require blood work to make sure it is a safe drug for you.  A liver function panel will be performed prior to starting the medication and after the first month of treatment.  It is important to have the blood work performed to avoid any harmful side effects.  In general, this medication safe but blood work is required. 4. Laser Therapy.  This treatment is performed by applying a specialized laser to the affected nail plate.  This therapy is noninvasive, fast, and non-painful.  It is not covered by insurance and is therefore, out of pocket.  The results have been very good with a 80-95% cure rate.  [facility] is the only practice in the area to offer this therapy. 5. Permanent Nail Avulsion.  Removing the entire nail so that a new nail will not grow back. 

## 2018-03-25 NOTE — Progress Notes (Signed)
Subjective: James [name] presents with cc of painful calluses b/l hallux which interfere with activities of daily living. Pain is aggravated when weightbearing with and without shoe gear. Pain is relieved with periodic professional debridement.  He is accompanied by his mother on today's visit.  [place], [name] is his PCP.   Current Outpatient Medications:  .  benztropine (COGENTIN) 0.5 MG tablet, Take 0.5 mg by mouth 2 (two) times daily., Disp: , Rfl:  .  buPROPion (ZYBAN) 150 MG 12 hr tablet, , Disp: , Rfl:  .  LATUDA 80 MG TABS tablet, 80 mg 2 (two) times daily. , Disp: , Rfl:  .  lithium carbonate (ESKALITH) 450 MG CR tablet, Take 900 mg by mouth daily. , Disp: , Rfl:  .  metFORMIN (GLUCOPHAGE) 500 MG tablet, Take 1 tablet (500 mg total) by mouth 2 (two) times daily with a meal., Disp: 28 tablet, Rfl: 0 .  metFORMIN (GLUCOPHAGE) 500 MG tablet, Take 1 tablet (500 mg total) by mouth 2 (two) times daily with a meal., Disp: 180 tablet, Rfl: 0 .  Misc Natural Products (FOCUSED MIND PO), Take 4 capsules by mouth 2 (two) times daily. , Disp: , Rfl:  .  Multiple Vitamins-Minerals (MULTI COMPLETE PO), Take 2 each by mouth 1 day or 1 dose. Two Chews Daily., Disp: , Rfl:  .  urea (GORDONS UREA) 40 % ointment, Apply topically as needed., Disp: 30 g, Rfl: 0  Allergies  Allergen Reactions  . Clozapine Other (See Comments)  . Mango Flavor     Mango; [place]: Patient has no known reaction to report. Mom states he just don't like.    Vascular Examination: Capillary refill time <3 seconds x 10 digits  Dorsalis pedis and Posterior tibial pulses present b/l  No digital hair x 10 digits  Skin temperature gradient WNL b/l  Dermatological Examination: Skin with normal turgor, texture and tone b/l  Toenails 1-5 b/l discolored, thick, dystrophic with subungual debris and pain with palpation to nailbeds due to thickness of nails.  Porokeratotic lesions noted plantar aspect of hallux  interphalangeal joint bilaterally.  Lesions are tender to palpation.  There is no erythema, no edema, no drainage, no flocculence noted.  Musculoskeletal: Muscle strength 5/5 to all LE muscle groups  Pes planus foot deformity noted bilaterally.  Neurological: Sensation intact with 10 gram monofilament.  Assessment: 1. Painful porokeratotic lesions bilateral hallux interphalangeal joint  Plan: 1. Porokeratotic lesions pared plantar aspect hallux interphalangeal joint bilaterally without incident. 2. Patient to continue soft, supportive shoe gear 3. Patient's guardian t to report any pedal injuries to medical professional  4. Follow up 3 months.  5. Patient's guardian to call should there be a concern in the interim.

## 2018-03-27 NOTE — Telephone Encounter (Signed)
Notified pt's mother

## 2018-03-27 NOTE — Telephone Encounter (Signed)
Disability forms/social security forms should be filled out by specialist/psychatrist as patient has one. So have her come by pick up form and have them fill it out. Pyschiatrist should fill it out

## 2018-03-30 NOTE — Telephone Encounter (Signed)
Pt's mother stated that forms are no longer needed and can be shredded. Please advise.

## 2018-03-31 NOTE — Telephone Encounter (Addendum)
Okay if mother states forms no longer needed then can go ahead and shred  the form.

## 2018-06-05 ENCOUNTER — Other Ambulatory Visit: Payer: Self-pay | Admitting: Medical

## 2018-06-24 ENCOUNTER — Other Ambulatory Visit: Payer: Self-pay

## 2018-06-24 ENCOUNTER — Ambulatory Visit (INDEPENDENT_AMBULATORY_CARE_PROVIDER_SITE_OTHER): Payer: Medicare Other | Admitting: Podiatry

## 2018-06-24 VITALS — Temp 97.9°F

## 2018-06-24 DIAGNOSIS — Q828 Other specified congenital malformations of skin: Secondary | ICD-10-CM

## 2018-06-24 DIAGNOSIS — B351 Tinea unguium: Secondary | ICD-10-CM | POA: Diagnosis not present

## 2018-06-24 DIAGNOSIS — M79675 Pain in left toe(s): Secondary | ICD-10-CM | POA: Diagnosis not present

## 2018-06-24 DIAGNOSIS — M79671 Pain in right foot: Secondary | ICD-10-CM | POA: Diagnosis not present

## 2018-06-24 DIAGNOSIS — M79674 Pain in right toe(s): Secondary | ICD-10-CM | POA: Diagnosis not present

## 2018-06-24 DIAGNOSIS — M79672 Pain in left foot: Secondary | ICD-10-CM

## 2018-06-25 ENCOUNTER — Other Ambulatory Visit: Payer: Self-pay | Admitting: Medical

## 2018-06-26 ENCOUNTER — Other Ambulatory Visit: Payer: Self-pay | Admitting: Registered Nurse

## 2018-06-26 ENCOUNTER — Other Ambulatory Visit: Payer: Self-pay

## 2018-06-26 ENCOUNTER — Observation Stay (HOSPITAL_COMMUNITY)
Admission: RE | Admit: 2018-06-26 | Discharge: 2018-06-27 | Disposition: A | Discharge status: Other Acute Inpatient Hospital | Payer: Medicare Other | Attending: Psychiatry | Admitting: Psychiatry

## 2018-06-26 ENCOUNTER — Encounter (HOSPITAL_COMMUNITY): Payer: Self-pay | Admitting: *Deleted

## 2018-06-26 DIAGNOSIS — Z79899 Other long term (current) drug therapy: Secondary | ICD-10-CM | POA: Diagnosis not present

## 2018-06-26 DIAGNOSIS — Z7984 Long term (current) use of oral hypoglycemic drugs: Secondary | ICD-10-CM | POA: Diagnosis not present

## 2018-06-26 DIAGNOSIS — F419 Anxiety disorder, unspecified: Secondary | ICD-10-CM | POA: Diagnosis not present

## 2018-06-26 DIAGNOSIS — F200 Paranoid schizophrenia: Secondary | ICD-10-CM

## 2018-06-26 DIAGNOSIS — K219 Gastro-esophageal reflux disease without esophagitis: Secondary | ICD-10-CM | POA: Diagnosis not present

## 2018-06-26 DIAGNOSIS — G4700 Insomnia, unspecified: Secondary | ICD-10-CM | POA: Diagnosis not present

## 2018-06-26 DIAGNOSIS — F209 Schizophrenia, unspecified: Principal | ICD-10-CM | POA: Diagnosis present

## 2018-06-26 LAB — COMPREHENSIVE METABOLIC PANEL
ALT: 26 U/L (ref 0–44)
AST: 21 U/L (ref 15–41)
Albumin: 4.1 g/dL (ref 3.5–5.0)
Alkaline Phosphatase: 69 U/L (ref 38–126)
Anion gap: 6 (ref 5–15)
BUN: 15 mg/dL (ref 6–20)
CO2: 26 mmol/L (ref 22–32)
Calcium: 9.5 mg/dL (ref 8.9–10.3)
Chloride: 111 mmol/L (ref 98–111)
Creatinine, Ser: 1.06 mg/dL (ref 0.61–1.24)
GFR calc Af Amer: >60 mL/min (ref >60)
GFR calc non Af Amer: >60 mL/min (ref >60)
Glucose, Bld: 96 mg/dL (ref 70–99)
Potassium: 4 mmol/L (ref 3.5–5.1)
Sodium: 143 mmol/L (ref 135–145)
Total Bilirubin: 0.3 mg/dL (ref 0.3–1.2)
Total Protein: 6.8 g/dL (ref 6.5–8.1)

## 2018-06-26 LAB — CBC
HCT: 44.4 % (ref 39.0–52.0)
Hemoglobin: 14.4 g/dL (ref 13.0–17.0)
MCH: 30.3 pg (ref 26.0–34.0)
MCHC: 32.4 g/dL (ref 30.0–36.0)
MCV: 93.3 fL (ref 80.0–100.0)
Platelets: 194 10*3/uL (ref 150–400)
RBC: 4.76 MIL/uL (ref 4.22–5.81)
RDW: 13.9 % (ref 11.5–15.5)
WBC: 5 10*3/uL (ref 4.0–10.5)
nRBC: 0 % (ref 0.0–0.2)

## 2018-06-26 LAB — LIPID PANEL
Cholesterol: 133 mg/dL (ref 0–200)
HDL: 62 mg/dL (ref >40)
LDL Cholesterol: 61 mg/dL (ref 0–99)
Total CHOL/HDL Ratio: 2.1 RATIO
Triglycerides: 50 mg/dL (ref <150)
VLDL: 10 mg/dL (ref 0–40)

## 2018-06-26 LAB — TSH: TSH: 0.355 u[IU]/mL (ref 0.350–4.500)

## 2018-06-26 LAB — ETHANOL: Alcohol, Ethyl (B): <10 mg/dL (ref <10)

## 2018-06-26 LAB — LITHIUM LEVEL: Lithium Lvl: <0.06 mmol/L — ABNORMAL LOW (ref 0.60–1.20)

## 2018-06-26 MED ORDER — LURASIDONE HCL 40 MG PO TABS
80.0000 mg | ORAL_TABLET | Freq: Two times a day (BID) | ORAL | Status: DC
  Administered 2018-06-26 – 2018-06-27 (×2): 80 mg via ORAL
  Filled 2018-06-26 (×2): qty 2

## 2018-06-26 MED ORDER — ADULT MULTIVITAMIN W/MINERALS CH
1.0000 | ORAL_TABLET | Freq: Every day | ORAL | Status: DC
  Administered 2018-06-27: 1 via ORAL
  Filled 2018-06-26: qty 1

## 2018-06-26 MED ORDER — BENZTROPINE MESYLATE 0.5 MG PO TABS
ORAL_TABLET | ORAL | Status: AC
  Filled 2018-06-26: qty 1

## 2018-06-26 MED ORDER — BENZTROPINE MESYLATE 0.5 MG PO TABS
0.5000 mg | ORAL_TABLET | Freq: Two times a day (BID) | ORAL | Status: DC
  Administered 2018-06-26 – 2018-06-27 (×2): 0.5 mg via ORAL
  Filled 2018-06-26: qty 1

## 2018-06-26 MED ORDER — LITHIUM CARBONATE ER 450 MG PO TBCR
EXTENDED_RELEASE_TABLET | ORAL | Status: AC
  Filled 2018-06-26: qty 2

## 2018-06-26 MED ORDER — METFORMIN HCL 500 MG PO TABS
500.0000 mg | ORAL_TABLET | Freq: Two times a day (BID) | ORAL | Status: DC
  Administered 2018-06-26 – 2018-06-27 (×2): 500 mg via ORAL
  Filled 2018-06-26 (×2): qty 1

## 2018-06-26 MED ORDER — LITHIUM CARBONATE ER 450 MG PO TBCR
900.0000 mg | EXTENDED_RELEASE_TABLET | Freq: Every day | ORAL | Status: DC
  Administered 2018-06-26 – 2018-06-27 (×2): 900 mg via ORAL
  Filled 2018-06-26 (×3): qty 2

## 2018-06-26 MED ORDER — MULTI COMPLETE PO CAPS: 1.0000 | ORAL_CAPSULE | ORAL | Status: DC

## 2018-06-26 MED ORDER — TRAZODONE HCL 50 MG PO TABS
50.0000 mg | ORAL_TABLET | Freq: Every evening | ORAL | Status: DC | PRN
  Administered 2018-06-26: 21:00:00 50 mg via ORAL
  Filled 2018-06-26: qty 1

## 2018-06-26 NOTE — Progress Notes (Signed)
[place] NOVEL CORONAVIRUS (COVID-19) DAILY CHECK-OFF SYMPTOMS - answer yes or no to each - every day NO YES  Have you had a fever in the past 24 hours?  . Fever (Temp > 37.80C / 100F) X   Have you had any of these symptoms in the past 24 hours? . New Cough .  Sore Throat  .  Shortness of Breath .  Difficulty Breathing .  Unexplained Body Aches   X   Have you had any one of these symptoms in the past 24 hours not related to allergies?   . Runny Nose .  Nasal Congestion .  Sneezing   X   If you have had runny nose, nasal congestion, sneezing in the past 24 hours, has it worsened?  X   EXPOSURES - check yes or no X   Have you traveled outside the state in the past 14 days?  X   Have you been in contact with someone with a confirmed diagnosis of COVID-19 or PUI in the past 14 days without wearing appropriate PPE?  X   Have you been living in the same home as a person with confirmed diagnosis of COVID-19 or a PUI (household contact)?    X   Have you been diagnosed with COVID-19?    X              What to do next: Answered NO to all: Answered YES to anything:   Proceed with unit schedule Follow the [facility] Inpatient Flowsheet.   

## 2018-06-26 NOTE — Progress Notes (Signed)
UA collected. Pending results.  
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
## 2018-06-26 NOTE — H&P (Signed)
Behavioral Health Medical Screening Exam  James [name] is an 38 y.o. male patient presents as walk in at [facility] brought in by his mother with complaints of auditory hallucinations, easily agitated, irritability, aggressive behavior and being off of medications for at least a month.  Patient denies all; states no to suicidal/homicidal ideation, psychosis, and paranoia.  Patient observed responding to internal stimuli and irritability.    Total Time spent with patient: 30 minutes  Psychiatric Specialty Exam: Physical Exam  Vitals reviewed. Constitutional: He appears well-nourished.  Neck: Normal range of motion.  Respiratory: Effort normal.  Musculoskeletal: Normal range of motion.  Neurological: He is alert.  Skin: Skin is warm and dry.    Review of Systems  Psychiatric/Behavioral: Positive for hallucinations. The patient is nervous/anxious and has insomnia.        Patient denies auditory visual hallucinations, irritability, and agitation.  Also denies altercation with father.  Mother of patient reports that patient has been off his medications for at least a month.  Patient has become increasingly agitated and has had one altercation with father and threaten father today.  Informs that patient is also hearing voices. States that they are afraid to have in home when he "is like this"   All other systems reviewed and are negative.   Blood pressure 113/78, pulse 72, temperature 98.5 F (36.9 C), temperature source Oral, resp. rate 18, SpO2 99 %.There is no height or weight on file to calculate BMI.  General Appearance: Disheveled  Eye Contact:  Minimal  Speech:  Slow  Volume:  Decreased  Mood:  Anxious and Irritable  Affect:  Labile  Thought Process:  Linear  Orientation:  Full (Time, Place, and Person)  Thought Content:  Hallucinations: Auditory  Suicidal Thoughts:  denies  Homicidal Thoughts:  Denies  Memory:  Immediate;   Poor Recent;   Poor  Judgement:  Impaired  Insight:   Lacking  Psychomotor Activity:  Restlessness  Concentration: Concentration: Fair  Recall:  [employer] of Knowledge:Fair  Language: Fair  Akathisia:  No  Handed:  Right  AIMS (if indicated):     Assets:  Housing Resilience Social Support  Sleep:       Musculoskeletal: Strength & Muscle Tone: within normal limits Gait & Station: normal Patient leans: N/A  Blood pressure 113/78, pulse 72, temperature 98.5 F (36.9 C), temperature source Oral, resp. rate 18, SpO2 99 %.  Recommendations:  Inpatient psychiatric treatment  Based on my evaluation the patient does not appear to have an emergency medical condition.  Aamina Skiff, NP 06/26/2018, 5:11 PM

## 2018-06-26 NOTE — Progress Notes (Addendum)
James [name] was paranoid/anxious on approach. Pt was given fluids and snacks. Provider on call notified for PRN for sleep. Pt took scheduled meds. Support offered. Will continue with POC.

## 2018-06-26 NOTE — H&P (Signed)
BH Observation Unit Provider Admission PAA/H&P  Patient Identification: James [name] MRN:  [id number] Date of Evaluation:  06/26/2018 Chief Complaint:  IDD Principal Diagnosis: Schizophrenia (HCC) Diagnosis:  Principal Problem:   Schizophrenia (HCC)  History of Present Illness: James [name] is an 38 y.o. male patient presents as walk in at [facility] brought in by his mother with complaints of auditory hallucinations, easily agitated, irritability, aggressive behavior and being off of medications for at least a month.  Patient denies all; states no to suicidal/homicidal ideation, psychosis, and paranoia.  Patient observed responding to internal stimuli and irritability.   Associated Signs/Symptoms: Depression Symptoms:  insomnia, difficulty concentrating, anxiety, (Hypo) Manic Symptoms:  Hallucinations, Impulsivity, Irritable Mood, Labiality of Mood, Anxiety Symptoms:  Excessive Worry, Psychotic Symptoms:  Hallucinations: Auditory Paranoia, PTSD Symptoms: NA Total Time spent with patient: 30 minutes  Past Psychiatric History: Schizophrenia   Is the patient at risk to self? No.  Has the patient been a risk to self in the past 6 months? No.  Has the patient been a risk to self within the distant past? No.  Is the patient a risk to others? Yes.    Has the patient been a risk to others in the past 6 months? No.  Has the patient been a risk to others within the distant past? No.   Prior Inpatient Therapy: Prior Inpatient Therapy: Yes Prior Therapy Dates: 06/05/2007 Prior Therapy Facilty/Provider(s): [facility] Reason for Treatment: schizophrenia Prior Outpatient Therapy: Prior Outpatient Therapy: Yes Prior Therapy Dates: ongoing Prior Therapy Facilty/Provider(s): Dr. Crista Elliot Reason for Treatment: med mngt Does patient have an ACCT team?: No Does patient have Intensive In-House Services?  : No Does patient have [facility] services? : No Does patient have P4CC services?:  No  Alcohol Screening:   Substance Abuse History in the last 12 months:  No. Consequences of Substance Abuse: NA Previous Psychotropic Medications: Yes  Psychological Evaluations: Yes  Past Medical History:  Past Medical History:  Diagnosis Date  . Gastroesophageal reflux disease 11/25/2017   No past surgical history on file. Family History:  Family History  Problem Relation Age of Onset  . Diabetes Mother   . Diabetes Father   . Prostate cancer Father    Family Psychiatric History: Unaware Tobacco Screening:   Social History:  Social History   Substance and Sexual Activity  Alcohol Use No  . Alcohol/week: 0.0 standard drinks   Comment: rare alcohol     Social History   Substance and Sexual Activity  Drug Use No    Additional Social History: Marital status: Single    Pain Medications: None reported Prescriptions: See MAR Over the Counter: See MAR History of alcohol / drug use?: No history of alcohol / drug abuse                    Allergies:   Allergies  Allergen Reactions  . Clozapine Other (See Comments)  . Mango Flavor     Mango; [place]: Patient has no known reaction to report. Mom states he just don't like.   Lab Results: No results found for this or any previous visit (from the past 48 hour(s)).  Blood Alcohol level:  Lab Results  Component Value Date   [facility]  06/03/2007    <5        LOWEST DETECTABLE LIMIT FOR SERUM ALCOHOL IS 11 mg/dL FOR MEDICAL PURPOSES ONLY    Metabolic Disorder Labs:  Lab Results  Component Value Date   HGBA1C  5.6 08/05/2017   No results found for: PROLACTIN Lab Results  Component Value Date   CHOL 166 08/05/2017   TRIG 99.0 08/05/2017   HDL 51.10 08/05/2017   CHOLHDL 3 08/05/2017   VLDL 19.8 08/05/2017   LDLCALC 95 08/05/2017    Current Medications: Current Facility-Administered Medications  Medication Dose Route Frequency Provider Last Rate Last Dose  . benztropine (COGENTIN) tablet 0.5 mg  0.5 mg  Oral BID Necie Wilcoxson B, NP      . lithium carbonate (ESKALITH) CR tablet 900 mg  900 mg Oral Daily Inari Shin B, NP      . lurasidone (LATUDA) tablet 80 mg  80 mg Oral BID Quintana Canelo B, NP      . metFORMIN (GLUCOPHAGE) tablet 500 mg  500 mg Oral BID WC Jenette Rayson B, NP      . Multi Complete CAPS 1 tablet  1 tablet Oral 1 day or 1 dose Tanasia Budzinski B, NP       PTA Medications: Medications Prior to Admission  Medication Sig Dispense Refill Last Dose  . benztropine (COGENTIN) 0.5 MG tablet Take 0.5 mg by mouth 2 (two) times daily.   Taking  . LATUDA 80 MG TABS tablet 80 mg 2 (two) times daily.    Taking  . lithium carbonate (ESKALITH) 450 MG CR tablet Take 900 mg by mouth daily.    Taking  . metFORMIN (GLUCOPHAGE) 500 MG tablet Take 1 tablet (500 mg total) by mouth 2 (two) times daily with a meal. 28 tablet 0 Taking  . metFORMIN (GLUCOPHAGE) 500 MG tablet Take 1 tablet (500 mg total) by mouth 2 (two) times daily with a meal. 180 tablet 0 Taking  . metFORMIN (GLUCOPHAGE) 500 MG tablet TAKE 1 TABLET BY MOUTH TWICE DAILY WITH MEALS 180 tablet 0   . Multiple Vitamins-Minerals (MULTI COMPLETE PO) Take 2 each by mouth 1 day or 1 dose. Two Chews Daily.   Taking  . urea (CARMOL) 40 % CREA APPLY CREAM EXTERNALLY TO AFFECTED AREA AS NEEDED   Taking  . urea (GORDONS UREA) 40 % ointment Apply topically as needed. 30 g 0 Taking    Musculoskeletal: Strength & Muscle Tone: within normal limits Gait & Station: normal Patient leans: N/A   Psychiatric Specialty Exam: Physical Exam  Vitals reviewed. Constitutional: He appears well-nourished.  Neck: Normal range of motion.  Respiratory: Effort normal.  Musculoskeletal: Normal range of motion.  Neurological: He is alert.  Skin: Skin is warm and dry.    Review of Systems  Psychiatric/Behavioral: Positive for hallucinations. The patient is nervous/anxious and has insomnia.        Patient denies auditory visual hallucinations,  irritability, and agitation.  Also denies altercation with father.  Mother of patient reports that patient has been off his medications for at least a month.  Patient has become increasingly agitated and has had one altercation with father and threaten father today.  Informs that patient is also hearing voices. States that they are afraid to have in home when he "is like this"   All other systems reviewed and are negative.   Blood pressure 113/78, pulse 72, temperature 98.5 F (36.9 C), temperature source Oral, resp. rate 18, SpO2 99 %.There is no height or weight on file to calculate BMI.  General Appearance: Disheveled  Eye Contact:  Minimal  Speech:  Slow  Volume:  Decreased  Mood:  Anxious and Irritable  Affect:  Labile  Thought Process:  Linear  Orientation:  Full (Time, Place, and Person)  Thought Content:  Hallucinations: Auditory  Suicidal Thoughts:  denies  Homicidal Thoughts:  Denies  Memory:  Immediate;   Poor Recent;   Poor  Judgement:  Impaired  Insight:  Lacking  Psychomotor Activity:  Restlessness  Concentration: Concentration: Fair  Recall:  [employer]  Fund of Knowledge:Fair  Language: Fair  Akathisia:  No  Handed:  Right  AIMS (if indicated):     Assets:  Housing Resilience Social Support  Sleep:         Treatment Plan Summary: Plan Observation 24 hours and medication management.  Restart home medications  Observation Level/Precautions:  15 minute checks Laboratory:  CBC Chemistry Profile HbAIC UDS UA Psychotherapy:  As appropriate Medications:  Restart home medications Consultations:  As appropriate Discharge Concerns:  Safety, stabilization, and access to medication Estimated LOS: 24 hours Other:      Assunta FoundShuvon Fatimata Talsma, NP 5/22/20205:43 PM

## 2018-06-26 NOTE — Plan of Care (Signed)
[facility] Observation Crisis Plan  Reason for Crisis Plan:  Chronic Mental Illness/Medical Illness and Medication Management   Plan of Care:  Referral for Inpatient Hospitalization  Family Support:      Current Living Environment:  Living Arrangements: Parent(Per mom "lives with both parents".)  Insurance:   Hospital Account    Name Acct ID Class Status Primary Coverage   James [name], James [name] [id number] BEHAVIORAL HEALTH OBSERVATION Open MEDICARE - MEDICARE PART A AND B        Guarantor Account (for Hospital Account [id number])    Name Relation to Pt Service Area Active? Acct Type   James [name] Self [facility] Yes [facility]   Address Phone       [address] [place], [place] [address] [phone](H)          Coverage Information (for Hospital Account [id number])    1. MEDICARE/MEDICARE PART A AND B    F/O Payor/Plan Precert #   MEDICARE/MEDICARE PART A AND B    Subscriber Subscriber #   James, [name] [id number]   Address Phone   [address] [place], [place] [address]        2. [facility] [place]ID[place]    F/O Payor/Plan Precert #   [facility] [place]ID/[place]    Subscriber Subscriber #   James, [name] [id number] [facility]   Address Phone   [address] [place], [place] [address] [phone]          Legal Guardian:  Legal Guardian: Mother  Primary Care Provider:  Marisue [name]  Current Outpatient Providers:  James [name]  Psychiatrist:  Name of Psychiatrist: Dr. Crista [name]  Counselor/Therapist:  Name of Therapist: none  Compliant with Medications:  Yes  Additional Information:   James [name] 5/22/20207:45 PM

## 2018-06-26 NOTE — Progress Notes (Signed)
Pt is a 38 y/o male who presents as a walk in to [facility] unit accompanied by his mother. Pt noted with blunted affect, irritability and observed responding to internal stimuli (+AH) with some hand gestures. Pt is also paranoid /suspicious and hypervigilant on interactions. Denies SI, HI, AVH and pain when assessed. Per pt's mother he's been experiencing mood lability lately. She stated "he tried was verbally aggressive towards his dad with a posture but later was apologetic earlier today. Mom reported that pt has been off his medications X 1 month. She stated to writer "I have been reading on the Latuda and Olanzapine, I want him to stop both if possible because I feel like they are causing  tardive dyskinesia and I have been reading on that". Pt confirmed poor sleep lately as well "I get up in the middle of the night and can't go back to sleep". Skin search done, no areas of breakdown noted. All belongings secured in locker 56. Q 15 minutes checks initiated for safety.

## 2018-06-27 DIAGNOSIS — G4700 Insomnia, unspecified: Secondary | ICD-10-CM | POA: Diagnosis not present

## 2018-06-27 DIAGNOSIS — F419 Anxiety disorder, unspecified: Secondary | ICD-10-CM | POA: Diagnosis not present

## 2018-06-27 DIAGNOSIS — Z7984 Long term (current) use of oral hypoglycemic drugs: Secondary | ICD-10-CM | POA: Diagnosis not present

## 2018-06-27 DIAGNOSIS — Z79899 Other long term (current) drug therapy: Secondary | ICD-10-CM | POA: Diagnosis not present

## 2018-06-27 DIAGNOSIS — F17210 Nicotine dependence, cigarettes, uncomplicated: Secondary | ICD-10-CM | POA: Diagnosis present

## 2018-06-27 DIAGNOSIS — K219 Gastro-esophageal reflux disease without esophagitis: Secondary | ICD-10-CM | POA: Diagnosis not present

## 2018-06-27 DIAGNOSIS — F209 Schizophrenia, unspecified: Secondary | ICD-10-CM | POA: Diagnosis present

## 2018-06-27 DIAGNOSIS — F200 Paranoid schizophrenia: Secondary | ICD-10-CM | POA: Diagnosis not present

## 2018-06-27 DIAGNOSIS — F250 Schizoaffective disorder, bipolar type: Secondary | ICD-10-CM | POA: Diagnosis not present

## 2018-06-27 DIAGNOSIS — E119 Type 2 diabetes mellitus without complications: Secondary | ICD-10-CM | POA: Diagnosis present

## 2018-06-27 LAB — RAPID URINE DRUG SCREEN, HOSP PERFORMED
Amphetamines: NOT DETECTED
Barbiturates: NOT DETECTED
Benzodiazepines: NOT DETECTED
Cocaine: NOT DETECTED
Opiates: NOT DETECTED
Tetrahydrocannabinol: NOT DETECTED

## 2018-06-27 LAB — CBC
HCT: 47.8 % (ref 39.0–52.0)
Hemoglobin: 14.9 g/dL (ref 13.0–17.0)
MCH: 29.3 pg (ref 26.0–34.0)
MCHC: 31.2 g/dL (ref 30.0–36.0)
MCV: 93.9 fL (ref 80.0–100.0)
Platelets: 201 10*3/uL (ref 150–400)
RBC: 5.09 MIL/uL (ref 4.22–5.81)
RDW: 13.8 % (ref 11.5–15.5)
WBC: 5.8 10*3/uL (ref 4.0–10.5)
nRBC: 0 % (ref 0.0–0.2)

## 2018-06-27 LAB — URINALYSIS, COMPLETE (UACMP) WITH MICROSCOPIC
Bacteria, UA: NONE SEEN
Bilirubin Urine: NEGATIVE
Glucose, UA: NEGATIVE mg/dL
Hgb urine dipstick: NEGATIVE
Ketones, ur: NEGATIVE mg/dL
Leukocytes,Ua: NEGATIVE
Nitrite: NEGATIVE
Protein, ur: NEGATIVE mg/dL
Specific Gravity, Urine: 1.015 (ref 1.005–1.030)
pH: 9 — ABNORMAL HIGH (ref 5.0–8.0)

## 2018-06-27 LAB — COMPREHENSIVE METABOLIC PANEL
ALT: 28 U/L (ref 0–44)
AST: 22 U/L (ref 15–41)
Albumin: 4.5 g/dL (ref 3.5–5.0)
Alkaline Phosphatase: 74 U/L (ref 38–126)
Anion gap: 6 (ref 5–15)
BUN: 13 mg/dL (ref 6–20)
CO2: 27 mmol/L (ref 22–32)
Calcium: 10.2 mg/dL (ref 8.9–10.3)
Chloride: 108 mmol/L (ref 98–111)
Creatinine, Ser: 0.84 mg/dL (ref 0.61–1.24)
GFR calc Af Amer: >60 mL/min (ref >60)
GFR calc non Af Amer: >60 mL/min (ref >60)
Glucose, Bld: 79 mg/dL (ref 70–99)
Potassium: 4.6 mmol/L (ref 3.5–5.1)
Sodium: 141 mmol/L (ref 135–145)
Total Bilirubin: 0.2 mg/dL — ABNORMAL LOW (ref 0.3–1.2)
Total Protein: 7.8 g/dL (ref 6.5–8.1)

## 2018-06-27 LAB — LITHIUM LEVEL: Lithium Lvl: 0.31 mmol/L — ABNORMAL LOW (ref 0.60–1.20)

## 2018-06-27 LAB — HEMOGLOBIN A1C
Hgb A1c MFr Bld: 5.4 % (ref 4.8–5.6)
Hgb A1c MFr Bld: 5.5 % (ref 4.8–5.6)
Mean Plasma Glucose: 108.28 mg/dL
Mean Plasma Glucose: 111.15 mg/dL

## 2018-06-27 LAB — TSH: TSH: 0.666 u[IU]/mL (ref 0.350–4.500)

## 2018-06-27 LAB — ETHANOL: Alcohol, Ethyl (B): <10 mg/dL (ref <10)

## 2018-06-27 LAB — LIPID PANEL
Cholesterol: 151 mg/dL (ref 0–200)
HDL: 72 mg/dL (ref >40)
LDL Cholesterol: 72 mg/dL (ref 0–99)
Total CHOL/HDL Ratio: 2.1 RATIO
Triglycerides: 34 mg/dL (ref <150)
VLDL: 7 mg/dL (ref 0–40)

## 2018-06-27 MED ORDER — PNEUMOCOCCAL VAC POLYVALENT 25 MCG/0.5ML IJ INJ: 0.5000 mL | INJECTION | INTRAMUSCULAR | Status: DC

## 2018-06-27 NOTE — Progress Notes (Signed)
D: [facility] His mom is his legal guardian, and she will be signing him in voluntarily for treatment. [facility]. Report called to Bedelia Person, RN. Patient denies SI/HI/AVH, but continues to appear to be responding to internal stimuli. He is calm, cooperative, and pleasant. Low risk for elopement.  A: Transported via [facility]. Transfer record sent with patient.

## 2018-06-27 NOTE — Progress Notes (Signed)
Per James [name] (Admissions), pt has been accepted at [facility] in the [place] [facility] Unit. Accepting provider is Dr. Otho Perl, MD. Number to call report is [phone]. CSW was informed that as patient has a guardian (mother James [name]), guardian would need to call [phone] in order to give consent for patient admission there. James [name] stated that they would also need copy of guardianship paperwork.   CSW contacted guardian James [name] at [phone]) and informed of inpatient recommendation and that he had been accepted at [facility]. She was updated that as his guardian she would need to contact [facility] to give consent for him to get treatment there. She was also informed that they needed a copy of the guardianship paperwork. She stated that had this and would speak with them about it. She inquired regarding visitation at [facility] and what they would do there as far as medication. SW informed her that those questions could be better answered by personnel at that facility. She asked about COVID-19 at the other facility. SW explained that most inpatient facilities screen patients prior to admission to their facilities in order to cut down on chances of COVID-19 getting into their facilities. SW stated that in most cases if any COVID-19 symptoms are present facilities would not accept patient. James [name] (guardian) and CSW discussed typical inpatient responsibilities and them working on reconnecting patient with resources on outpatient basis prior to discharge. James [name] (guardian) stated that she would contact [facility] with other questions and to consent for admission. Contact ended without issue.   CSW updated Wilhemina Bonito., RN regarding acceptance at [facility].    Vilma Meckel. Algis Greenhouse, MSW, LCSW Clinical Social Work/Disposition Phone: [phone] Fax: [phone]

## 2018-06-27 NOTE — Progress Notes (Signed)
[facility] NOVEL CORONAVIRUS (COVID-19) DAILY CHECK-OFF SYMPTOMS - answer yes or no to each - every day NO YES  Have you had a fever in the past 24 hours?  . Fever (Temp > 37.80C / 100F) X   Have you had any of these symptoms in the past 24 hours? . New Cough .  Sore Throat  .  Shortness of Breath .  Difficulty Breathing .  Unexplained Body Aches   X   Have you had any one of these symptoms in the past 24 hours not related to allergies?   . Runny Nose .  Nasal Congestion .  Sneezing   X   If you have had runny nose, nasal congestion, sneezing in the past 24 hours, has it worsened?  X   EXPOSURES - check yes or no X   Have you traveled outside the state in the past 14 days?  X   Have you been in contact with someone with a confirmed diagnosis of COVID-19 or PUI in the past 14 days without wearing appropriate PPE?  X   Have you been living in the same home as a person with confirmed diagnosis of COVID-19 or a PUI (household contact)?    X   Have you been diagnosed with COVID-19?    X              What to do next: Answered NO to all: Answered YES to anything:   Proceed with unit schedule Follow the [facility] Inpatient Flowsheet.   

## 2018-06-27 NOTE — Progress Notes (Signed)
Patient meets criteria for inpatient treatment. No appropriate or available beds at [facility]. CSW faxed referrals to the following facilities for review:  [facility]  [facility]  [facility]  [facility]  [facility]  [facility]  [facility]  [facility]  [facility]  [facility]  [facility]  [facility]  [facility]  [facility]  [facility]  [facility]  [facility]  [facility]  [facility]  [facility]  [facility]  [facility]   [facility] will continue to seek bed placement.  James [name]. Algis Greenhouse, MSW, LCSW Clinical Social Work/Disposition Phone: [phone] Fax: [phone]

## 2018-06-27 NOTE — Progress Notes (Signed)
   06/27/18 0740  COVID-19 Daily Checkoff  Have you had a fever (temp > 37.80C/100F)  in the past 24 hours?  No  If you have had runny nose, nasal congestion, sneezing in the past 24 hours, has it worsened? No  COVID-19 EXPOSURE  Have you traveled outside the state in the past 14 days? No  Have you been in contact with someone with a confirmed diagnosis of COVID-19 or PUI in the past 14 days without wearing appropriate PPE? No  Have you been living in the same home as a person with confirmed diagnosis of COVID-19 or a PUI (household contact)? No  Have you been diagnosed with COVID-19? No

## 2018-06-27 NOTE — Progress Notes (Signed)
Patient ID: James [name], male   DOB: [birth year], 38 y.o.   MRN: [id number] D: Patient is isolative, disorganized, suspicious. He is cooperative with morning medication pass. He is seen laying in bed. He does not appear to be responding to internal stimuli. Patient denies SI/HI/AVH. When given his meds, he acts suspicious and examines them before agreeing to take them.  A: Provided support and encouragement. Educated on medications. R: Patient went back to sleep.

## 2018-06-27 NOTE — Discharge Summary (Signed)
[facility] MD Progress Note  06/27/2018 11:11 AM James [name]  MRN:  [id number]   Subjective:  Pt was seen and chart reviewed, discussed with treatment team. Pt appears disorganized. He stated he takes his medicine as prescribed however his Lithium level is sub-therapeutic at 0.20. He stated he slept and ate well at the hospital. He stated he lives with his parents but denies he has been aggressive. He did admit he gets upset easily. He appears to be responding to internal stimuli but denies hearing voices. Pt is recommended for inpatient hospitalization and has been accepted at [facility] today. His mother has been notified and is willing to sign him in voluntarily.   Principal Problem: Schizophrenia (HCC) Diagnosis: Principal Problem:   Schizophrenia (HCC)  Total Time spent with patient: 30 minutes  Past Psychiatric History: As above  Past Medical History:  Past Medical History:  Diagnosis Date  . Gastroesophageal reflux disease 11/25/2017   No past surgical history on file. Family History:  Family History  Problem Relation Age of Onset  . Diabetes Mother   . Diabetes Father   . Prostate cancer Father    Family Psychiatric  History:Pt denies Social History:  Social History   Substance and Sexual Activity  Alcohol Use No  . Alcohol/week: 0.0 standard drinks   Comment: rare alcohol     Social History   Substance and Sexual Activity  Drug Use No    Social History   Socioeconomic History  . Marital status: Single    Spouse name: Not on file  . Number of children: Not on file  . Years of education: Not on file  . Highest education level: Not on file  Occupational History  . Not on file  Social Needs  . Financial resource strain: Not on file  . Food insecurity:    Worry: Not on file    Inability: Not on file  . Transportation needs:    Medical: Not on file    Non-medical: Not on file  Tobacco Use  . Smoking status: Current Every Day Smoker    Packs/day: 0.50     Types: Cigarettes  . Smokeless tobacco: Never Used  Substance and Sexual Activity  . Alcohol use: No    Alcohol/week: 0.0 standard drinks    Comment: rare alcohol  . Drug use: No  . Sexual activity: Yes    Comment: male  Lifestyle  . Physical activity:    Days per week: Not on file    Minutes per session: Not on file  . Stress: Not on file  Relationships  . Social connections:    Talks on phone: Not on file    Gets together: Not on file    Attends religious service: Not on file    Active member of club or organization: Not on file    Attends meetings of clubs or organizations: Not on file    Relationship status: Not on file  Other Topics Concern  . Not on file  Social History Narrative  . Not on file   Additional Social History:    Pain Medications: None reported Prescriptions: See MAR Over the Counter: See MAR History of alcohol / drug use?: No history of alcohol / drug abuse                    Sleep: Fair  Appetite:  Fair  Current Medications: Current Facility-Administered Medications  Medication Dose Route Frequency Provider Last Rate Last Dose  .  benztropine (COGENTIN) tablet 0.5 mg  0.5 mg Oral BID Rankin, Shuvon B, NP   0.5 mg at 06/27/18 0740  . lithium carbonate (ESKALITH) CR tablet 900 mg  900 mg Oral Daily Rankin, Shuvon B, NP   900 mg at 06/27/18 0742  . lurasidone (LATUDA) tablet 80 mg  80 mg Oral BID WC Rankin, Shuvon B, NP   80 mg at 06/27/18 0739  . metFORMIN (GLUCOPHAGE) tablet 500 mg  500 mg Oral BID WC Rankin, Shuvon B, NP   500 mg at 06/27/18 0739  . multivitamin with minerals tablet 1 tablet  1 tablet Oral Daily Nelly Rout, MD   1 tablet at 06/27/18 0739  . [START ON 06/28/2018] pneumococcal 23 valent vaccine (PNU-IMMUNE) injection 0.5 mL  0.5 mL Intramuscular Tomorrow-1000 Nelly Rout, MD      . traZODone (DESYREL) tablet 50 mg  50 mg Oral QHS PRN,MR X 1 Nira Conn A, NP   50 mg at 06/26/18 2046    Lab Results:  Results for  orders placed or performed during the hospital encounter of 06/26/18 (from the past 48 hour(s))  CBC     Status: None   Collection Time: 06/26/18  6:36 PM  Result Value Ref Range   WBC 5.0 4.0 - 10.5 K/uL   RBC 4.76 4.22 - 5.81 MIL/uL   Hemoglobin 14.4 13.0 - 17.0 g/dL   HCT 62.9 52.8 - 41.3 %   MCV 93.3 80.0 - 100.0 fL   MCH 30.3 26.0 - 34.0 pg   MCHC 32.4 30.0 - 36.0 g/dL   RDW 24.4 01.0 - 27.2 %   Platelets 194 150 - 400 K/uL   nRBC 0.0 0.0 - 0.2 %    Comment: Performed at [facility], [address], [place], [place] [address]  Comprehensive metabolic panel     Status: None   Collection Time: 06/26/18  6:36 PM  Result Value Ref Range   Sodium 143 135 - 145 mmol/L   Potassium 4.0 3.5 - 5.1 mmol/L   Chloride 111 98 - 111 mmol/L   CO2 26 22 - 32 mmol/L   Glucose, Bld 96 70 - 99 mg/dL   BUN 15 6 - 20 mg/dL   Creatinine, Ser 4.03 0.61 - 1.24 mg/dL   Calcium 9.5 8.9 - 47.4 mg/dL   Total Protein 6.8 6.5 - 8.1 g/dL   Albumin 4.1 3.5 - 5.0 g/dL   AST 21 15 - 41 U/L   ALT 26 0 - 44 U/L   Alkaline Phosphatase 69 38 - 126 U/L   Total Bilirubin 0.3 0.3 - 1.2 mg/dL   GFR calc non Af Amer >60 >60 mL/min   GFR calc Af Amer >60 >60 mL/min   Anion gap 6 5 - 15    Comment: Performed at [facility], [address] [place], [place] [address]  Hemoglobin A1c     Status: None   Collection Time: 06/26/18  6:36 PM  Result Value Ref Range   Hgb A1c MFr Bld 5.4 4.8 - 5.6 %    Comment: (NOTE) Pre diabetes:          5.7%-6.4% Diabetes:              >6.4% Glycemic control for   <7.0% adults with diabetes    Mean Plasma Glucose 108.28 mg/dL    Comment: Performed at [facility], [address], [place], [place] [address]  Ethanol     Status:  None   Collection Time: 06/26/18  6:36 PM  Result Value Ref Range   Alcohol, Ethyl (B) <10 <10 mg/dL    Comment: (NOTE) Lowest detectable limit for serum alcohol is 10 mg/dL. For medical purposes  only. Performed at [facility], [address], [place], [place] [address]   Lipid panel     Status: None   Collection Time: 06/26/18  6:36 PM  Result Value Ref Range   Cholesterol 133 0 - 200 mg/dL   Triglycerides 50 <784 mg/dL   HDL 62 >69 mg/dL   Total CHOL/HDL Ratio 2.1 RATIO   VLDL 10 0 - 40 mg/dL   LDL Cholesterol 61 0 - 99 mg/dL    Comment:        Total Cholesterol/HDL:CHD Risk Coronary Heart Disease Risk Table                     Men   Women  1/2 Average Risk   3.4   3.3  Average Risk       5.0   4.4  2 X Average Risk   9.6   7.1  3 X Average Risk  23.4   11.0        Use the calculated Patient Ratio above and the CHD Risk Table to determine the patient's CHD Risk.        ATP III CLASSIFICATION (LDL):  <100     mg/dL   Optimal  629-528  mg/dL   Near or Above                    Optimal  130-159  mg/dL   Borderline  413-244  mg/dL   High  >010     mg/dL   Very High Performed at [facility], [address] [place], [place] [address]   TSH     Status: None   Collection Time: 06/26/18  6:36 PM  Result Value Ref Range   TSH 0.355 0.350 - 4.500 uIU/mL    Comment: Performed by a 3rd Generation assay with a functional sensitivity of <=0.01 uIU/mL. Performed at [facility], [address] [place], [place] [address]   Lithium level     Status: Abnormal   Collection Time: 06/26/18  6:36 PM  Result Value Ref Range   Lithium Lvl <0.06 (L) 0.60 - 1.20 mmol/L    Comment: Performed at [facility], [address] [place], [place] [address]  Urinalysis, Complete w Microscopic     Status: Abnormal   Collection Time: 06/26/18  8:48 PM  Result Value Ref Range   Color, Urine YELLOW YELLOW   APPearance CLEAR CLEAR   Specific Gravity, Urine 1.015 1.005 - 1.030   pH 9.0 (H) 5.0 - 8.0   Glucose, UA NEGATIVE NEGATIVE mg/dL   Hgb urine dipstick NEGATIVE NEGATIVE   Bilirubin Urine NEGATIVE NEGATIVE    Ketones, ur NEGATIVE NEGATIVE mg/dL   Protein, ur NEGATIVE NEGATIVE mg/dL   Nitrite NEGATIVE NEGATIVE   Leukocytes,Ua NEGATIVE NEGATIVE   RBC / HPF 0-5 0 - 5 RBC/hpf   Bacteria, UA NONE SEEN NONE SEEN   Mucus PRESENT     Comment: Performed at [facility], [address] [place], [place] [address]  Urine rapid drug screen (hosp performed)not at [facility]     Status: None   Collection Time: 06/26/18  8:48 PM  Result Value Ref Range   Opiates NONE DETECTED  NONE DETECTED   Cocaine NONE DETECTED NONE DETECTED   Benzodiazepines NONE DETECTED NONE DETECTED   Amphetamines NONE DETECTED NONE DETECTED   Tetrahydrocannabinol NONE DETECTED NONE DETECTED   Barbiturates NONE DETECTED NONE DETECTED    Comment: (NOTE) DRUG SCREEN FOR MEDICAL PURPOSES ONLY.  IF CONFIRMATION IS NEEDED FOR ANY PURPOSE, NOTIFY LAB WITHIN 5 DAYS. LOWEST DETECTABLE LIMITS FOR URINE DRUG SCREEN Drug Class                     Cutoff (ng/mL) Amphetamine and metabolites    1000 Barbiturate and metabolites    200 Benzodiazepine                 200 Tricyclics and metabolites     300 Opiates and metabolites        300 Cocaine and metabolites        300 THC                            50 Performed at  [facility], [address] [place], [place] [address]   CBC     Status: None   Collection Time: 06/27/18  6:42 AM  Result Value Ref Range   WBC 5.8 4.0 - 10.5 K/uL   RBC 5.09 4.22 - 5.81 MIL/uL   Hemoglobin 14.9 13.0 - 17.0 g/dL   HCT 91.447.8 78.239.0 - 95.652.0 %   MCV 93.9 80.0 - 100.0 fL   MCH 29.3 26.0 - 34.0 pg   MCHC 31.2 30.0 - 36.0 g/dL   RDW 21.313.8 08.611.5 - 57.815.5 %   Platelets 201 150 - 400 K/uL   nRBC 0.0 0.0 - 0.2 %    Comment: Performed at [facility], [address] [place], [place] [address]  Comprehensive metabolic panel     Status: Abnormal   Collection Time: 06/27/18  6:42 AM  Result Value Ref Range   Sodium 141 135 - 145 mmol/L   Potassium 4.6 3.5 - 5.1  mmol/L   Chloride 108 98 - 111 mmol/L   CO2 27 22 - 32 mmol/L   Glucose, Bld 79 70 - 99 mg/dL   BUN 13 6 - 20 mg/dL   Creatinine, Ser 9.520.84 0.61 - 1.24 mg/dL   Calcium 84.110.2 8.9 - 32.410.3 mg/dL   Total Protein 7.8 6.5 - 8.1 g/dL   Albumin 4.5 3.5 - 5.0 g/dL   AST 22 15 - 41 U/L   ALT 28 0 - 44 U/L   Alkaline Phosphatase 74 38 - 126 U/L   Total Bilirubin 0.2 (L) 0.3 - 1.2 mg/dL   GFR calc non Af Amer >60 >60 mL/min   GFR calc Af Amer >60 >60 mL/min   Anion gap 6 5 - 15    Comment: Performed at [facility], [address] [place], [place] [address]  Ethanol     Status: None   Collection Time: 06/27/18  6:42 AM  Result Value Ref Range   Alcohol, Ethyl (B) <10 <10 mg/dL    Comment: (NOTE) Lowest detectable limit for serum alcohol is 10 mg/dL. For medical purposes only. Performed at [facility], [address] [place], [place] [address]   Hemoglobin A1c     Status: None   Collection Time: 06/27/18  6:42 AM  Result Value Ref Range   Hgb A1c MFr Bld 5.5 4.8 - 5.6 %    Comment: (NOTE)  Pre diabetes:          5.7%-6.4% Diabetes:              >6.4% Glycemic control for   <7.0% adults with diabetes    Mean Plasma Glucose 111.15 mg/dL    Comment: Performed at [facility], [address] [place], [place] [address]  Lipid panel     Status: None   Collection Time: 06/27/18  6:42 AM  Result Value Ref Range   Cholesterol 151 0 - 200 mg/dL   Triglycerides 34 <017 mg/dL   HDL 72 >51 mg/dL   Total CHOL/HDL Ratio 2.1 RATIO   VLDL 7 0 - 40 mg/dL   LDL Cholesterol 72 0 - 99 mg/dL    Comment:        Total Cholesterol/HDL:CHD Risk Coronary Heart Disease Risk Table                     Men   Women  1/2 Average Risk   3.4   3.3  Average Risk       5.0   4.4  2 X Average Risk   9.6   7.1  3 X Average Risk  23.4   11.0        Use the calculated Patient Ratio above and the CHD Risk Table to determine the patient's CHD Risk.        ATP III  CLASSIFICATION (LDL):  <100     mg/dL   Optimal  025-852  mg/dL   Near or Above                    Optimal  130-159  mg/dL   Borderline  778-242  mg/dL   High  >353     mg/dL   Very High Performed at [facility], [address] [place], [place], [place] [address]   Lithium level     Status: Abnormal   Collection Time: 06/27/18  6:42 AM  Result Value Ref Range   Lithium Lvl 0.31 (L) 0.60 - 1.20 mmol/L    Comment: Performed at [facility], [address], [place], [place] [address]  TSH     Status: None   Collection Time: 06/27/18  6:42 AM  Result Value Ref Range   TSH 0.666 0.350 - 4.500 uIU/mL    Comment: Performed by a 3rd Generation assay with a functional sensitivity of <=0.01 uIU/mL. Performed at [facility], [address] [place], [place] [address]     Blood Alcohol level:  Lab Results  Component Value Date   ETH <10 06/27/2018   ETH <10 06/26/2018    Metabolic Disorder Labs: Lab Results  Component Value Date   HGBA1C 5.5 06/27/2018   MPG 111.15 06/27/2018   MPG 108.28 06/26/2018   No results found for: PROLACTIN Lab Results  Component Value Date   CHOL 151 06/27/2018   TRIG 34 06/27/2018   HDL 72 06/27/2018   CHOLHDL 2.1 06/27/2018   VLDL 7 06/27/2018   LDLCALC 72 06/27/2018   LDLCALC 61 06/26/2018    Physical Findings: AIMS: Facial and Oral Movements Muscles of Facial Expression: None, normal Lips and Perioral Area: None, normal Jaw: None, normal Tongue: None, normal,Extremity Movements Upper (arms, wrists, hands, fingers): None, normal Lower (legs, knees, ankles, toes): None, normal, Trunk Movements Neck, shoulders, hips: None, normal, Overall Severity Severity of abnormal movements (highest score from questions above): None, normal Incapacitation due  to abnormal movements: None, normal Patient's awareness of abnormal movements (rate only patient's report): No Awareness, Dental Status Current  problems with teeth and/or dentures?: No Does patient usually wear dentures?: No  CIWA:    COWS:     Musculoskeletal: Strength & Muscle Tone: within normal limits Gait & Station: normal Patient leans: N/A  Psychiatric Specialty Exam: Physical Exam  Constitutional: He appears well-developed.  HENT:  Head: Normocephalic.  Musculoskeletal: Normal range of motion.  Neurological: He is alert.  Psychiatric: He is slowed. Thought content is paranoid.    Review of Systems  Psychiatric/Behavioral: Positive for hallucinations (auditory).  All other systems reviewed and are negative.   Blood pressure 113/63, pulse (!) 101, temperature 98 F (36.7 C), temperature source Oral, resp. rate 20, SpO2 93 %.There is no height or weight on file to calculate BMI.  General Appearance: Casual and Guarded  Eye Contact:  Fair  Speech:  Slow  Volume:  Decreased  Mood:  Euthymic  Affect:  Congruent  Thought Process:  Disorganized  Orientation:  Full (Time, Place, and Person)  Thought Content:  Hallucinations: Auditory  Suicidal Thoughts:  No  Homicidal Thoughts:  No  Memory:  Immediate;   Fair Recent;   Fair Remote;   Fair  Judgement:  Impaired  Insight:  Lacking  Psychomotor Activity:  Normal  Concentration:  Concentration: Fair and Attention Span: Fair  Recall:  [employer] of Knowledge:  Fair  Language:  Good  Akathisia:  No  Handed:  Right  AIMS (if indicated):     Assets:  Architect Housing Physical Health Resilience Social Support  ADL's:  Intact  Cognition:  WNL  Sleep:        Treatment Plan Summary: Daily contact with patient to assess and evaluate symptoms and progress in treatment, Medication management and Plan Accepted to [facility]  Laveda Abbe, NP 06/27/2018, 11:11 AM

## 2018-07-04 ENCOUNTER — Encounter: Payer: Self-pay | Admitting: Podiatry

## 2018-07-04 NOTE — Progress Notes (Signed)
Subjective: James [name] presents today with painful calluses and thick toenails 1-5 b/l that he cannot cut and which interfere with daily activities.  Pain is aggravated when wearing enclosed shoe gear.  [name], James Mc is his PCP.   No current outpatient medications on file.  Allergies  Allergen Reactions  . Clozapine Other (See Comments)  . Mango Flavor     Mango; [place]: Patient has no known reaction to report. Mom states he just don't like.    Objective: Vitals:   06/24/18 1007  Temp: 97.9 F (36.6 C)    Vascular Examination: Capillary refill time <3 seconds x 10 digits.  Dorsalis pedis and Posterior tibial pulses palpable b/l.  Digital hair absent  x 10 digits.  Skin temperature gradient WNL b/l.  Dermatological Examination: Skin with normal turgor, texture and tone b/l.  Toenails 1-5 b/l discolored, thick, dystrophic with subungual debris and pain with palpation to nailbeds due to thickness of nails.  Porokeratotic lesions plantar hallux IPJ b/l with tenderness to palpation. No erythema, no edema, no drainage, no flocculence.  Musculoskeletal: Muscle strength 5/5 to all LE muscle groups.  Pes planus foot deformity b/l.  No pain, crepitus or joint limitation noted with ROM.   Neurological: Sensation intact with 10 gram monofilament.  Vibratory sensation intact.  Assessment: Painful onychomycosis toenails 1-5 b/l  Porokeratoses b/l hallux Pain in feet b/l  Plan: 1. Toenails 1-5 b/l were debrided in length and girth without iatrogenic bleeding. 2. Porokeratosis b/l hallux pared and enucleated with sterile scalpel blade without incident. Patient to continue soft, supportive shoe gear daily. Patient to report any pedal injuries to medical professional immediately. Follow up 3 months.  Patient/POA to call should there be a concern in the interim.

## 2018-07-16 DIAGNOSIS — F209 Schizophrenia, unspecified: Secondary | ICD-10-CM | POA: Diagnosis not present

## 2018-07-16 DIAGNOSIS — F1211 Cannabis abuse, in remission: Secondary | ICD-10-CM | POA: Diagnosis not present

## 2018-08-06 ENCOUNTER — Ambulatory Visit: Payer: Medicare Other | Admitting: *Deleted

## 2018-08-10 NOTE — Progress Notes (Addendum)
Virtual Visit via Video Note  I connected with patient on 08/11/18 at  1:00 PM EDT by audio enabled telemedicine application and verified that I am speaking with the correct person using two identifiers.   THIS ENCOUNTER IS A VIRTUAL VISIT DUE TO COVID-19 - PATIENT WAS NOT SEEN IN THE OFFICE. PATIENT HAS CONSENTED TO VIRTUAL VISIT / TELEMEDICINE VISIT   Location of patient: home  Location of provider: office  I discussed the limitations of evaluation and management by telemedicine and the availability of in person appointments. The patient expressed understanding and agreed to proceed.   Subjective:   James [name] is a 38 y.o. male who presents for Medicare Annual/Subsequent preventive examination.  Mother present on call.   Review of Systems: No ROS.  Medicare Wellness Virtual Visit.  Visual/audio telehealth visit, UTA vital signs.   See social history for additional risk factors. Cardiac Risk Factors include: male gender;smoking/ tobacco exposure Sleep patterns: no issues Home Safety/Smoke Alarms: Feels safe in home. Smoke alarms in place.  Lives with parents and nephew.  Male:     PSA- No results found for: PSA      Objective:    Advanced Directives 08/11/2018 08/05/2017  Does Patient Have a Medical Advance Directive? No No  Would patient like information on creating a medical advance directive? No - Patient declined No - Patient declined  Some encounter information is confidential and restricted. Go to Review Flowsheets activity to see all data.    Tobacco Social History   Tobacco Use  Smoking Status Current Every Day Smoker  . Packs/day: 0.50  . Types: Cigarettes  Smokeless Tobacco Never Used     Ready to quit: Not Answered Counseling given: Not Answered   Clinical Intake: Pain : No/denies pain    Past Medical History:  Diagnosis Date  . Gastroesophageal reflux disease 11/25/2017   History reviewed. No pertinent surgical history. Family History   Problem Relation Age of Onset  . Diabetes Mother   . Diabetes Father   . Prostate cancer Father    Social History   Socioeconomic History  . Marital status: Single    Spouse name: Not on file  . Number of children: Not on file  . Years of education: Not on file  . Highest education level: Not on file  Occupational History  . Not on file  Social Needs  . Financial resource strain: Not on file  . Food insecurity    Worry: Not on file    Inability: Not on file  . Transportation needs    Medical: Not on file    Non-medical: Not on file  Tobacco Use  . Smoking status: Current Every Day Smoker    Packs/day: 0.50    Types: Cigarettes  . Smokeless tobacco: Never Used  Substance and Sexual Activity  . Alcohol use: No    Alcohol/week: 0.0 standard drinks    Comment: rare alcohol  . Drug use: No  . Sexual activity: Yes    Comment: male  Lifestyle  . Physical activity    Days per week: Not on file    Minutes per session: Not on file  . Stress: Not on file  Relationships  . Social Herbalist on phone: Not on file    Gets together: Not on file    Attends religious service: Not on file    Active member of club or organization: Not on file    Attends meetings of clubs or organizations:  Not on file    Relationship status: Not on file  Other Topics Concern  . Not on file  Social History Narrative  . Not on file    Outpatient Encounter Medications as of 08/11/2018  Medication Sig  . ARIPiprazole (ABILIFY) 20 MG tablet Take 20 mg by mouth daily.  . benztropine (COGENTIN) 0.5 MG tablet Take 0.5 mg by mouth 2 (two) times daily.  Marland Kitchen. buPROPion (ZYBAN) 150 MG 12 hr tablet Take 150 mg by mouth 2 (two) times daily.  Marland Kitchen. lithium carbonate (ESKALITH) 450 MG CR tablet Take 450 mg by mouth 2 (two) times daily.  . metFORMIN (GLUCOPHAGE) 500 MG tablet Take 500 mg by mouth 2 (two) times daily with a meal.   No facility-administered encounter medications on file as of 08/11/2018.      Activities of Daily Living In your present state of health, do you have any difficulty performing the following activities: 08/11/2018  Hearing? N  Vision? N  Difficulty concentrating or making decisions? N  Walking or climbing stairs? N  Dressing or bathing? N  Doing errands, shopping? Y  Preparing Food and eating ? N  Using the Toilet? N  In the past six months, have you accidently leaked urine? N  Do you have problems with loss of bowel control? N  Managing your Medications? Y  Managing your Finances? Y  Housekeeping or managing your Housekeeping? Y  Some encounter information is confidential and restricted. Go to Review Flowsheets activity to see all data.  Some recent data might be hidden    Patient Care Team: Saguier, Kateri McEdward, PA-C as PCP - General (Internal Medicine)   Assessment:   This is a routine wellness examination for James [name]. Physical assessment deferred to PCP.  Exercise Activities and Dietary recommendations Current Exercise Habits: Home exercise routine, Type of exercise: walking, Time (Minutes): 10, Frequency (Times/Week): 5, Weekly Exercise (Minutes/Week): 50, Intensity: Mild, Exercise limited by: None identified Diet (meal preparation, eat out, water intake, caffeinated beverages, dairy products, fruits and vegetables): well balanced, on average, 3 meals per day  Goals    . get a job    . Quit Smoking       Fall Risk Fall Risk  08/05/2017  Falls in the past year? No    Depression Screen PHQ 2/9 Scores 08/11/2018 08/05/2017 01/21/2017 10/31/2016  PHQ - 2 Score 0 0 0 3  PHQ- 9 Score - - - 11    Cognitive Function     MMSE - Mini Mental State Exam 08/11/2018 08/05/2017  Not completed: Refused -  Orientation to time - 5  Orientation to Place - 5  Registration - 3  Attention/ Calculation - 5  Recall - 3  Language- name 2 objects - 2  Language- repeat - 1  Language- follow 3 step command - 3  Language- read & follow direction - 1  Write a sentence - 1   Copy design - 1  Total score - 30        Immunization History  Administered Date(s) Administered  . Influenza,inj,Quad PF,6+ Mos 03/22/2016, 10/31/2016  . Tdap 08/31/2015    Screening Tests Health Maintenance  Topic Date Due  . HIV Screening  03/25/1994  . INFLUENZA VACCINE  09/05/2018  . TETANUS/TDAP  08/30/2025     Plan:  Please schedule your next medicare wellness visit with me in 1 yr.  Continue to eat heart healthy diet (full of fruits, vegetables, whole grains, lean protein, water--limit salt, fat, and sugar intake)  and increase physical activity as tolerated.  Continue doing brain stimulating activities (puzzles, reading, adult coloring books, staying active) to keep memory sharp.    I have personally reviewed and noted the following in the patient's chart:   . Medical and social history . Use of alcohol, tobacco or illicit drugs  . Current medications and supplements . Functional ability and status . Nutritional status . Physical activity . Advanced directives . List of other physicians . Hospitalizations, surgeries, and ER visits in previous 12 months . Vitals . Screenings to include cognitive, depression, and falls . Referrals and appointments  In addition, I have reviewed and discussed with patient certain preventive protocols, quality metrics, and best practice recommendations. A written personalized care plan for preventive services as well as general preventive health recommendations were provided to patient.     Avon GullyBritt, Bryleigh Ottaway Angel, [place]  08/11/2018   Reviewed and agree with evaluation & recommendation of rn.  Esperanza RichtersEdward Saguier, PA-C

## 2018-08-11 ENCOUNTER — Ambulatory Visit (INDEPENDENT_AMBULATORY_CARE_PROVIDER_SITE_OTHER): Payer: Medicare Other | Admitting: *Deleted

## 2018-08-11 ENCOUNTER — Encounter: Payer: Self-pay | Admitting: *Deleted

## 2018-08-11 DIAGNOSIS — Z0000 Encounter for general adult medical examination without abnormal findings: Secondary | ICD-10-CM

## 2018-08-11 NOTE — Patient Instructions (Signed)
Please schedule your next medicare wellness visit with me in 1 yr.  Continue to eat heart healthy diet (full of fruits, vegetables, whole grains, lean protein, water--limit salt, fat, and sugar intake) and increase physical activity as tolerated.  Continue doing brain stimulating activities (puzzles, reading, adult coloring books, staying active) to keep memory sharp.    James [name] , Thank you for taking time to come for your Medicare Wellness Visit. I appreciate your ongoing commitment to your health goals. Please review the following plan we discussed and let me know if I can assist you in the future.   These are the goals we discussed: Goals    . get a job    . Quit Smoking       This is a list of the screening recommended for you and due dates:  Health Maintenance  Topic Date Due  . HIV Screening  03/25/1994  . Flu Shot  09/05/2018  . Tetanus Vaccine  08/30/2025    Coping with Quitting Smoking  Quitting smoking is a physical and mental challenge. You will face cravings, withdrawal symptoms, and temptation. Before quitting, work with your health care provider to make a plan that can help you cope. Preparation can help you quit and keep you from giving in. How can I cope with cravings? Cravings usually last for 5-10 minutes. If you get through it, the craving will pass. Consider taking the following actions to help you cope with cravings:  Keep your mouth busy: ? Chew sugar-free gum. ? Suck on hard candies or a straw. ? Brush your teeth.  Keep your hands and body busy: ? Immediately change to a different activity when you feel a craving. ? Squeeze or play with a ball. ? Do an activity or a hobby, like making bead jewelry, practicing needlepoint, or working with wood. ? Mix up your normal routine. ? Take a short exercise break. Go for a quick walk or run up and down stairs. ? Spend time in public places where smoking is not allowed.  Focus on doing something kind or  helpful for someone else.  Call a friend or family member to talk during a craving.  Join a support group.  Call a quit line, such as 1-800-QUIT-NOW.  Talk with your health care provider about medicines that might help you cope with cravings and make quitting easier for you. How can I deal with withdrawal symptoms? Your body may experience negative effects as it tries to get used to not having nicotine in the system. These effects are called withdrawal symptoms. They may include:  Feeling hungrier than normal.  Trouble concentrating.  Irritability.  Trouble sleeping.  Feeling depressed.  Restlessness and agitation.  Craving a cigarette. To manage withdrawal symptoms:  Avoid places, people, and activities that trigger your cravings.  Remember why you want to quit.  Get plenty of sleep.  Avoid coffee and other caffeinated drinks. These may worsen some of your symptoms. How can I handle social situations? Social situations can be difficult when you are quitting smoking, especially in the first few weeks. To manage this, you can:  Avoid parties, bars, and other social situations where people might be smoking.  Avoid alcohol.  Leave right away if you have the urge to smoke.  Explain to your family and friends that you are quitting smoking. Ask for understanding and support.  Plan activities with friends or family where smoking is not an option. What are some ways I can cope with  stress? Wanting to smoke may cause stress, and stress can make you want to smoke. Find ways to manage your stress. Relaxation techniques can help. For example:  Breathe slowly and deeply, in through your nose and out through your mouth.  Listen to soothing, relaxing music.  Talk with a family member or friend about your stress.  Light a candle.  Soak in a bath or take a shower.  Think about a peaceful place. What are some ways I can prevent weight gain? Be aware that many people gain  weight after they quit smoking. However, not everyone does. To keep from gaining weight, have a plan in place before you quit and stick to the plan after you quit. Your plan should include:  Having healthy snacks. When you have a craving, it may help to: ? Eat plain popcorn, crunchy carrots, celery, or other cut vegetables. ? Chew sugar-free gum.  Changing how you eat: ? Eat small portion sizes at meals. ? Eat 4-6 small meals throughout the day instead of 1-2 large meals a day. ? Be mindful when you eat. Do not watch television or do other things that might distract you as you eat.  Exercising regularly: ? Make time to exercise each day. If you do not have time for a long workout, do short bouts of exercise for 5-10 minutes several times a day. ? Do some form of strengthening exercise, like weight lifting, and some form of aerobic exercise, like running or swimming.  Drinking plenty of water or other low-calorie or no-calorie drinks. Drink 6-8 glasses of water daily, or as much as instructed by your health care provider. Summary  Quitting smoking is a physical and mental challenge. You will face cravings, withdrawal symptoms, and temptation to smoke again. Preparation can help you as you go through these challenges.  You can cope with cravings by keeping your mouth busy (such as by chewing gum), keeping your body and hands busy, and making calls to family, friends, or a helpline for people who want to quit smoking.  You can cope with withdrawal symptoms by avoiding places where people smoke, avoiding drinks with caffeine, and getting plenty of rest.  Ask your health care provider about the different ways to prevent weight gain, avoid stress, and handle social situations. This information is not intended to replace advice given to you by your health care provider. Make sure you discuss any questions you have with your health care provider. Document Released: 01/19/2016 Document Revised:  01/03/2017 Document Reviewed: 01/19/2016 Elsevier Patient Education  2020 [employer]  Health Maintenance, Male Adopting a healthy lifestyle and getting preventive care are important in promoting health and wellness. Ask your health care provider about:  The right schedule for you to have regular tests and exams.  Things you can do on your own to prevent diseases and keep yourself healthy. What should I know about diet, weight, and exercise? Eat a healthy diet   Eat a diet that includes plenty of vegetables, fruits, low-fat dairy products, and lean protein.  Do not eat a lot of foods that are high in solid fats, added sugars, or sodium. Maintain a healthy weight Body mass index (BMI) is a measurement that can be used to identify possible weight problems. It estimates body fat based on height and weight. Your health care provider can help determine your BMI and help you achieve or maintain a healthy weight. Get regular exercise Get regular exercise. This is one of the most important things  you can do for your health. Most adults should:  Exercise for at least 150 minutes each week. The exercise should increase your heart rate and make you sweat (moderate-intensity exercise).  Do strengthening exercises at least twice a week. This is in addition to the moderate-intensity exercise.  Spend less time sitting. Even light physical activity can be beneficial. Watch cholesterol and blood lipids Have your blood tested for lipids and cholesterol at 39 years of age, then have this test every 5 years. You may need to have your cholesterol levels checked more often if:  Your lipid or cholesterol levels are high.  You are older than 39 years of age.  You are at high risk for heart disease. What should I know about cancer screening? Many types of cancers can be detected early and may often be prevented. Depending on your health history and family history, you may need to have cancer screening  at various ages. This may include screening for:  Colorectal cancer.  Prostate cancer.  Skin cancer.  Lung cancer. What should I know about heart disease, diabetes, and high blood pressure? Blood pressure and heart disease  High blood pressure causes heart disease and increases the risk of stroke. This is more likely to develop in people who have high blood pressure readings, are of African descent, or are overweight.  Talk with your health care provider about your target blood pressure readings.  Have your blood pressure checked: ? Every 3-5 years if you are 1818-39 years of age. ? Every year if you are 39 years old or older.  If you are between the ages of 3165 and 6175 and are a current or former smoker, ask your health care provider if you should have a one-time screening for abdominal aortic aneurysm (AAA). Diabetes Have regular diabetes screenings. This checks your fasting blood sugar level. Have the screening done:  Once every three years after age 39 if you are at a normal weight and have a low risk for diabetes.  More often and at a younger age if you are overweight or have a high risk for diabetes. What should I know about preventing infection? Hepatitis B If you have a higher risk for hepatitis B, you should be screened for this virus. Talk with your health care provider to find out if you are at risk for hepatitis B infection. Hepatitis C Blood testing is recommended for:  Everyone born from [id number] through 1966.  Anyone with known risk factors for hepatitis C. Sexually transmitted infections (STIs)  You should be screened each year for STIs, including gonorrhea and chlamydia, if: ? You are sexually active and are younger than 39 years of age. ? You are older than 39 years of age and your health care provider tells you that you are at risk for this type of infection. ? Your sexual activity has changed since you were last screened, and you are at increased risk for  chlamydia or gonorrhea. Ask your health care provider if you are at risk.  Ask your health care provider about whether you are at high risk for HIV. Your health care provider may recommend a prescription medicine to help prevent HIV infection. If you choose to take medicine to prevent HIV, you should first get tested for HIV. You should then be tested every 3 months for as long as you are taking the medicine. Follow these instructions at home: Lifestyle  Do not use any products that contain nicotine or tobacco, such as cigarettes,  e-cigarettes, and chewing tobacco. If you need help quitting, ask your health care provider.  Do not use street drugs.  Do not share needles.  Ask your health care provider for help if you need support or information about quitting drugs. Alcohol use  Do not drink alcohol if your health care provider tells you not to drink.  If you drink alcohol: ? Limit how much you have to 0-2 drinks a day. ? Be aware of how much alcohol is in your drink. In the [place] one drink equals one 12 oz bottle of beer (355 mL), one 5 oz glass of wine (148 mL), or one 1 oz glass of hard liquor (44 mL). General instructions  Schedule regular health, dental, and eye exams.  Stay current with your vaccines.  Tell your health care provider if: ? You often feel depressed. ? You have ever been abused or do not feel safe at home. Summary  Adopting a healthy lifestyle and getting preventive care are important in promoting health and wellness.  Follow your health care provider's instructions about healthy diet, exercising, and getting tested or screened for diseases.  Follow your health care provider's instructions on monitoring your cholesterol and blood pressure. This information is not intended to replace advice given to you by your health care provider. Make sure you discuss any questions you have with your health care provider. Document Released: 07/20/2007 Document Revised:  01/14/2018 Document Reviewed: 01/14/2018 Elsevier Patient Education  2020 [employer]

## 2018-08-12 DIAGNOSIS — F1211 Cannabis abuse, in remission: Secondary | ICD-10-CM | POA: Diagnosis not present

## 2018-08-12 DIAGNOSIS — F209 Schizophrenia, unspecified: Secondary | ICD-10-CM | POA: Diagnosis not present

## 2018-09-23 ENCOUNTER — Other Ambulatory Visit: Payer: Self-pay

## 2018-09-23 ENCOUNTER — Ambulatory Visit (INDEPENDENT_AMBULATORY_CARE_PROVIDER_SITE_OTHER): Payer: Medicare Other | Admitting: Podiatry

## 2018-09-23 ENCOUNTER — Encounter: Payer: Self-pay | Admitting: Podiatry

## 2018-09-23 VITALS — Temp 98.2°F

## 2018-09-23 DIAGNOSIS — B351 Tinea unguium: Secondary | ICD-10-CM | POA: Diagnosis not present

## 2018-09-23 DIAGNOSIS — M79674 Pain in right toe(s): Secondary | ICD-10-CM | POA: Diagnosis not present

## 2018-09-23 DIAGNOSIS — M79672 Pain in left foot: Secondary | ICD-10-CM | POA: Diagnosis not present

## 2018-09-23 DIAGNOSIS — Q828 Other specified congenital malformations of skin: Secondary | ICD-10-CM | POA: Diagnosis not present

## 2018-09-23 DIAGNOSIS — M79671 Pain in right foot: Secondary | ICD-10-CM

## 2018-09-23 DIAGNOSIS — M79675 Pain in left toe(s): Secondary | ICD-10-CM | POA: Diagnosis not present

## 2018-09-23 NOTE — Patient Instructions (Signed)
Corns and Calluses Corns are small areas of thickened skin that occur on the top, sides, or tip of a toe. They contain a cone-shaped core with a point that can press on a nerve below. This causes pain.  Calluses are areas of thickened skin that can occur anywhere on the body, including the hands, fingers, palms, soles of the feet, and heels. Calluses are usually larger than corns. What are the causes? Corns and calluses are caused by rubbing (friction) or pressure, such as from shoes that are too tight or do not fit properly. What increases the risk? Corns are more likely to develop in people who have misshapen toes (toe deformities), such as hammer toes. Calluses can occur with friction to any area of the skin. They are more likely to develop in people who:  Work with their hands.  Wear shoes that fit poorly, are too tight, or are high-heeled.  Have toe deformities. What are the signs or symptoms? Symptoms of a corn or callus include:  A hard growth on the skin.  Pain or tenderness under the skin.  Redness and swelling.  Increased discomfort while wearing tight-fitting shoes, if your feet are affected. If a corn or callus becomes infected, symptoms may include:  Redness and swelling that gets worse.  Pain.  Fluid, blood, or pus draining from the corn or callus. How is this diagnosed? Corns and calluses may be diagnosed based on your symptoms, your medical history, and a physical exam. How is this treated? Treatment for corns and calluses may include:  Removing the cause of the friction or pressure. This may involve: ? Changing your shoes. ? Wearing shoe inserts (orthotics) or other protective layers in your shoes, such as a corn pad. ? Wearing gloves.  Applying medicine to the skin (topical medicine) to help soften skin in the hardened, thickened areas.  Removing layers of dead skin with a file to reduce the size of the corn or callus.  Removing the corn or callus with a  scalpel or laser.  Taking antibiotic medicines, if your corn or callus is infected.  Having surgery, if a toe deformity is the cause. Follow these instructions at home:   Take over-the-counter and prescription medicines only as told by your health care provider.  If you were prescribed an antibiotic, take it as told by your health care provider. Do not stop taking it even if your condition starts to improve.  Wear shoes that fit well. Avoid wearing high-heeled shoes and shoes that are too tight or too loose.  Wear any padding, protective layers, gloves, or orthotics as told by your health care provider.  Soak your hands or feet and then use a file or pumice stone to soften your corn or callus. Do this as told by your health care provider.  Check your corn or callus every day for symptoms of infection. Contact a health care provider if you:  Notice that your symptoms do not improve with treatment.  Have redness or swelling that gets worse.  Notice that your corn or callus becomes painful.  Have fluid, blood, or pus coming from your corn or callus.  Have new symptoms. Summary  Corns are small areas of thickened skin that occur on the top, sides, or tip of a toe.  Calluses are areas of thickened skin that can occur anywhere on the body, including the hands, fingers, palms, and soles of the feet. Calluses are usually larger than corns.  Corns and calluses are caused by   rubbing (friction) or pressure, such as from shoes that are too tight or do not fit properly.  Treatment may include wearing any padding, protective layers, gloves, or orthotics as told by your health care provider. This information is not intended to replace advice given to you by your health care provider. Make sure you discuss any questions you have with your health care provider. Document Released: 10/28/2003 Document Revised: 05/13/2018 Document Reviewed: 12/04/2016 Elsevier Patient Education  2020 Elsevier  Inc.   Onychomycosis/Fungal Toenails  WHAT IS IT? An infection that lies within the keratin of your nail plate that is caused by a fungus.  WHY ME? Fungal infections affect all ages, sexes, races, and creeds.  There may be many factors that predispose you to a fungal infection such as age, coexisting medical conditions such as diabetes, or an autoimmune disease; stress, medications, fatigue, genetics, etc.  Bottom line: fungus thrives in a warm, moist environment and your shoes offer such a location.  IS IT CONTAGIOUS? Theoretically, yes.  You do not want to share shoes, nail clippers or files with someone who has fungal toenails.  Walking around barefoot in the same room or sleeping in the same bed is unlikely to transfer the organism.  It is important to realize, however, that fungus can spread easily from one nail to the next on the same foot.  HOW DO WE TREAT THIS?  There are several ways to treat this condition.  Treatment may depend on many factors such as age, medications, pregnancy, liver and kidney conditions, etc.  It is best to ask your doctor which options are available to you.  1. No treatment.   Unlike many other medical concerns, you can live with this condition.  However for many people this can be a painful condition and may lead to ingrown toenails or a bacterial infection.  It is recommended that you keep the nails cut short to help reduce the amount of fungal nail. 2. Topical treatment.  These range from herbal remedies to prescription strength nail lacquers.  About 40-50% effective, topicals require twice daily application for approximately 9 to 12 months or until an entirely new nail has grown out.  The most effective topicals are medical grade medications available through physicians offices. 3. Oral antifungal medications.  With an 80-90% cure rate, the most common oral medication requires 3 to 4 months of therapy and stays in your system for a year as the new nail grows out.   Oral antifungal medications do require blood work to make sure it is a safe drug for you.  A liver function panel will be performed prior to starting the medication and after the first month of treatment.  It is important to have the blood work performed to avoid any harmful side effects.  In general, this medication safe but blood work is required. 4. Laser Therapy.  This treatment is performed by applying a specialized laser to the affected nail plate.  This therapy is noninvasive, fast, and non-painful.  It is not covered by insurance and is therefore, out of pocket.  The results have been very good with a 80-95% cure rate.  [facility] is the only practice in the area to offer this therapy. 5. Permanent Nail Avulsion.  Removing the entire nail so that a new nail will not grow back. 

## 2018-10-01 NOTE — Progress Notes (Signed)
Subjective: James [name] presents to clinic with cc of painful mycotic toenails and porokeratotic lesions b/l feet which are aggravated when weightbearing with and without shoe gear.  This pain limits his daily activities. Pain symptoms resolve with periodic professional debridement.   Current Outpatient Medications:  .  ARIPiprazole (ABILIFY) 20 MG tablet, Take 20 mg by mouth daily., Disp: , Rfl:  .  benztropine (COGENTIN) 0.5 MG tablet, Take 0.5 mg by mouth 2 (two) times daily., Disp: , Rfl:  .  buPROPion (ZYBAN) 150 MG 12 hr tablet, Take 150 mg by mouth 2 (two) times daily., Disp: , Rfl:  .  lithium carbonate (ESKALITH) 450 MG CR tablet, Take 450 mg by mouth 2 (two) times daily., Disp: , Rfl:  .  metFORMIN (GLUCOPHAGE) 500 MG tablet, Take 500 mg by mouth 2 (two) times daily with a meal., Disp: , Rfl:    Allergies  Allergen Reactions  . Clozapine Other (See Comments)  . Mango Flavor     Mango; [place]: Patient has no known reaction to report. Mom states he just don't like.     Objective: Vitals:   09/23/18 1043  Temp: 98.2 F (36.8 C)    Physical Examination:  Vascular  Examination: Capillary refill time <3 seconds x 10 digits.  Palpable DP/PT pulses b/l.  Digital hair absent b/l.  No edema noted b/l.  Skin temperature gradient WNL b/l.  Dermatological Examination: Skin with normal turgor, texture and tone b/l.  No open wounds b/l.  No interdigital macerations noted b/l.  Elongated, thick, discolored brittle toenails with subungual debris and pain on dorsal palpation of nailbeds 1-5 b/l.  Porokeratotic lesions plantar hallux IPJ with tenderness to palpation. No erythema, no edema, no drainage, no flocculence.  Musculoskeletal Examination: Muscle strength 5/5 to all muscle groups b/l.  Pes planus b/l.  No pain, crepitus or joint discomfort with active/passive ROM.  Neurological Examination: Sensation intact 5/5 b/l with 10 gram monofilament.  Vibratory  sensation intact b/l.  Proprioceptive sensation intact b/l.  Assessment: 1. Mycotic nail infection with pain 1-5 b/l 2. Porokeratosis b/l hallux 3. Pain in foot b/l  Plan: 1. Toenails 1-5 b/l were debrided in length and girth without iatrogenic laceration. 2. Porokeratosis b/l hallux pared and enucleated with sterile scalpel blade without incident. 3. Continue soft, supportive shoe gear daily. 4. Report any pedal injuries to medical professional. 5. Follow up 3 months. 6. Patient/POA to call should there be a question/concern in there interim.

## 2018-11-02 ENCOUNTER — Other Ambulatory Visit: Payer: Self-pay | Admitting: Medical

## 2018-11-30 DIAGNOSIS — F209 Schizophrenia, unspecified: Secondary | ICD-10-CM | POA: Diagnosis not present

## 2018-11-30 DIAGNOSIS — F1211 Cannabis abuse, in remission: Secondary | ICD-10-CM | POA: Diagnosis not present

## 2018-12-18 ENCOUNTER — Other Ambulatory Visit: Payer: Self-pay

## 2018-12-18 ENCOUNTER — Ambulatory Visit (INDEPENDENT_AMBULATORY_CARE_PROVIDER_SITE_OTHER): Payer: Medicare Other

## 2018-12-18 DIAGNOSIS — Z23 Encounter for immunization: Secondary | ICD-10-CM

## 2018-12-21 ENCOUNTER — Encounter: Payer: Self-pay | Admitting: Medical

## 2018-12-21 ENCOUNTER — Ambulatory Visit (INDEPENDENT_AMBULATORY_CARE_PROVIDER_SITE_OTHER): Payer: Medicare Other | Admitting: Medical

## 2018-12-21 ENCOUNTER — Other Ambulatory Visit: Payer: Self-pay

## 2018-12-21 VITALS — BP 108/68 | HR 85 | Temp 96.6°F | Resp 16 | Ht 72.0 in | Wt 154.2 lb

## 2018-12-21 DIAGNOSIS — R1013 Epigastric pain: Secondary | ICD-10-CM | POA: Diagnosis not present

## 2018-12-21 DIAGNOSIS — R739 Hyperglycemia, unspecified: Secondary | ICD-10-CM

## 2018-12-21 LAB — CBC WITH DIFFERENTIAL/PLATELET
Basophils Absolute: 0 10*3/uL (ref 0.0–0.1)
Basophils Relative: 0.7 % (ref 0.0–3.0)
Eosinophils Absolute: 0.1 10*3/uL (ref 0.0–0.7)
Eosinophils Relative: 2.3 % (ref 0.0–5.0)
HCT: 45.8 % (ref 39.0–52.0)
Hemoglobin: 15.1 g/dL (ref 13.0–17.0)
Lymphocytes Relative: 33.7 % (ref 12.0–46.0)
Lymphs Abs: 2 10*3/uL (ref 0.7–4.0)
MCHC: 32.9 g/dL (ref 30.0–36.0)
MCV: 91.7 fl (ref 78.0–100.0)
Monocytes Absolute: 0.4 10*3/uL (ref 0.1–1.0)
Monocytes Relative: 6.8 % (ref 3.0–12.0)
Neutro Abs: 3.4 10*3/uL (ref 1.4–7.7)
Neutrophils Relative %: 56.5 % (ref 43.0–77.0)
Platelets: 191 10*3/uL (ref 150.0–400.0)
RBC: 5 Mil/uL (ref 4.22–5.81)
RDW: 14.2 % (ref 11.5–15.5)
WBC: 6 10*3/uL (ref 4.0–10.5)

## 2018-12-21 LAB — LIPASE: Lipase: 13 U/L (ref 11.0–59.0)

## 2018-12-21 LAB — COMPREHENSIVE METABOLIC PANEL
ALT: 18 U/L (ref 0–53)
AST: 13 U/L (ref 0–37)
Albumin: 4.5 g/dL (ref 3.5–5.2)
Alkaline Phosphatase: 63 U/L (ref 39–117)
BUN: 13 mg/dL (ref 6–23)
CO2: 25 mEq/L (ref 19–32)
Calcium: 9.8 mg/dL (ref 8.4–10.5)
Chloride: 107 mEq/L (ref 96–112)
Creatinine, Ser: 0.93 mg/dL (ref 0.40–1.50)
GFR: 109.03 mL/min (ref >60.00)
Glucose, Bld: 89 mg/dL (ref 70–99)
Potassium: 4.5 mEq/L (ref 3.5–5.1)
Sodium: 140 mEq/L (ref 135–145)
Total Bilirubin: 0.4 mg/dL (ref 0.2–1.2)
Total Protein: 6.9 g/dL (ref 6.0–8.3)

## 2018-12-21 LAB — HEMOGLOBIN A1C: Hgb A1c MFr Bld: 5.6 % (ref 4.6–6.5)

## 2018-12-21 MED ORDER — FAMOTIDINE 20 MG PO TABS: 20.0000 mg | ORAL_TABLET | Freq: Every day | ORAL | 3 refills | Status: DC

## 2018-12-21 NOTE — Patient Instructions (Addendum)
You do have probable gerd. Will rx famotadine and ask that you eat very healthy as discussed. Also try to stop smoking.  Will get cmp, cbc, and lipase today.   Also get a1c to check on your 3 month blood sugar average test.  Follow up 3 weeks or as neeed. Need to discuss if still having pain or any random vomiting that persist. If so then need to refer you to GI.

## 2018-12-21 NOTE — Progress Notes (Signed)
Subjective:    Patient ID: James [name], male    DOB: 03-24-1980, 38 y.o.   MRN: [id number]  HPI  Pt in office for upset stomach. He states burning sensation. Sometimes will get burning after eating but not consistently. Occasionally will vomit. But mom states this is very rare.   No black or blood stools.   Does eat some fast foods on occasion. At least 3-4 days a week.  Pt weight is up from last year. 3 lb increase.  Also hx of elevated sugar.       Review of Systems  Constitutional: Negative for chills, fatigue and fever.  Respiratory: Negative for cough, chest tightness, shortness of breath and wheezing.   Cardiovascular: Negative for chest pain and palpitations.  Gastrointestinal: Negative for abdominal pain, blood in stool, diarrhea and vomiting.       Last time had abdomen pain was last night off and on for about 30 minutes. Last night ate chicken and rice.   Musculoskeletal: Negative for back pain, gait problem and neck pain.  Skin: Negative for rash.  Hematological: Negative for adenopathy. Does not bruise/bleed easily.  Psychiatric/Behavioral: Negative for behavioral problems, decreased concentration and dysphoric mood. The patient is not nervous/anxious.     Past Medical History:  Diagnosis Date  . Gastroesophageal reflux disease 11/25/2017     Social History   Socioeconomic History  . Marital status: Single    Spouse name: Not on file  . Number of children: Not on file  . Years of education: Not on file  . Highest education level: Not on file  Occupational History  . Not on file  Social Needs  . Financial resource strain: Not on file  . Food insecurity    Worry: Not on file    Inability: Not on file  . Transportation needs    Medical: Not on file    Non-medical: Not on file  Tobacco Use  . Smoking status: Current Every Day Smoker    Packs/day: 0.50    Types: Cigarettes  . Smokeless tobacco: Never Used  Substance and Sexual Activity  .  Alcohol use: No    Alcohol/week: 0.0 standard drinks    Comment: rare alcohol  . Drug use: No  . Sexual activity: Yes    Comment: male  Lifestyle  . Physical activity    Days per week: Not on file    Minutes per session: Not on file  . Stress: Not on file  Relationships  . Social Herbalist on phone: Not on file    Gets together: Not on file    Attends religious service: Not on file    Active member of club or organization: Not on file    Attends meetings of clubs or organizations: Not on file    Relationship status: Not on file  . Intimate partner violence    Fear of current or ex partner: Not on file    Emotionally abused: Not on file    Physically abused: Not on file    Forced sexual activity: Not on file  Other Topics Concern  . Not on file  Social History Narrative  . Not on file    No past surgical history on file.  Family History  Problem Relation Age of Onset  . Diabetes Mother   . Diabetes Father   . Prostate cancer Father     Allergies  Allergen Reactions  . Clozapine Other (See Comments)  . Mango  Flavor     Mango; [place]: Patient has no known reaction to report. Mom states he just don't like.    Current Outpatient Medications on File Prior to Visit  Medication Sig Dispense Refill  . ARIPiprazole (ABILIFY) 20 MG tablet Take 20 mg by mouth daily.    . benztropine (COGENTIN) 0.5 MG tablet Take 0.5 mg by mouth 2 (two) times daily.    Marland Kitchen buPROPion (ZYBAN) 150 MG 12 hr tablet Take 150 mg by mouth 2 (two) times daily.    Marland Kitchen lithium carbonate (ESKALITH) 450 MG CR tablet Take 450 mg by mouth 2 (two) times daily.    . metFORMIN (GLUCOPHAGE) 500 MG tablet TAKE 1 TABLET BY MOUTH TWICE DAILY WITH MEALS 180 tablet 0   No current facility-administered medications on file prior to visit.     BP 108/68   Pulse 85   Temp (!) 96.6 F (35.9 C) (Temporal)   Resp 16   Ht 6' (1.829 m)   Wt 154 lb 3.2 oz (69.9 kg)   SpO2 99%   BMI 20.91 kg/m        Objective:   Physical Exam   General Mental Status- Alert. General Appearance- Not in acute distress.   Skin General: Color- Normal Color. Moisture- Normal Moisture.  Neck Carotid Arteries- Normal color. Moisture- Normal Moisture. No carotid bruits. No JVD.  Chest and Lung Exam Auscultation: Breath Sounds:-Normal.  Cardiovascular Auscultation:Rythm- Regular. Murmurs & Other Heart Sounds:Auscultation of the heart reveals- No Murmurs.  Abdomen Inspection:-Inspeection Normal. Palpation/Percussion:Note:No mass. Palpation and Percussion of the abdomen reveal- Non Tender, Non Distended + BS, no rebound or guarding.   Neurologic Cranial Nerve exam:- CN III-XII intact(No nystagmus), symmetric smile. Strength:- 5/5 equal and symmetric strength both upper and lower extremities.     Assessment & Plan:  You do have probable gerd. Will rx famotadine and ask that you eat very healthy as discussed. Also try to stop smoking.  Will get cmp, cbc, and lipase today.   Also get a1c to check on your 3 month blood sugar average test.  Follow up 3 weeks or as neeed. Need to discuss if still having pain or any random vomiting that persist. If so then need to refer you to GI.  25 minutes spent with pt. 50% of time spent counseling pt on plan going forward.  Esperanza Richters, PA-C

## 2018-12-23 ENCOUNTER — Ambulatory Visit: Payer: Medicare Other | Admitting: Podiatry

## 2019-01-12 ENCOUNTER — Ambulatory Visit: Payer: Medicare Other | Admitting: Medical

## 2019-03-21 ENCOUNTER — Other Ambulatory Visit: Payer: Self-pay | Admitting: Medical

## 2019-03-23 ENCOUNTER — Ambulatory Visit: Payer: Medicare Other | Admitting: Sports Medicine

## 2019-08-21 ENCOUNTER — Ambulatory Visit: Payer: Medicare Other | Attending: Internal Medicine

## 2019-08-21 DIAGNOSIS — Z23 Encounter for immunization: Secondary | ICD-10-CM

## 2019-08-21 NOTE — Progress Notes (Signed)
   Covid-19 Vaccination Clinic  Name:  James [name]    MRN: [id number] DOB: 04-07-1980  08/21/2019  Mr. Garde was observed post Covid-19 immunization for 15 minutes without incident. He was provided with Vaccine Information Sheet and instruction to access the V-Safe system.   Mr. Manrique was instructed to call 911 with any severe reactions post vaccine: Marland Kitchen Difficulty breathing  . Swelling of face and throat  . A fast heartbeat  . A bad rash all over body  . Dizziness and weakness   Immunizations Administered    Name Date Dose VIS Date Route   Pfizer COVID-19 Vaccine 08/21/2019  9:14 AM 0.3 mL 03/31/2018 Intramuscular   Manufacturer: [employer], [employer]   Lot: [id number]   NDC: [address]-8

## 2019-08-26 NOTE — Progress Notes (Signed)
Called pt 3x. No answer.

## 2019-08-27 ENCOUNTER — Other Ambulatory Visit: Payer: Self-pay

## 2019-08-27 ENCOUNTER — Ambulatory Visit: Payer: Medicare Other | Admitting: *Deleted

## 2019-09-07 ENCOUNTER — Other Ambulatory Visit: Payer: Self-pay | Admitting: Medical

## 2019-09-14 ENCOUNTER — Ambulatory Visit: Payer: Self-pay

## 2019-09-18 ENCOUNTER — Other Ambulatory Visit: Payer: Self-pay | Admitting: Medical

## 2019-10-30 DIAGNOSIS — Z23 Encounter for immunization: Secondary | ICD-10-CM | POA: Diagnosis not present

## 2019-11-20 ENCOUNTER — Encounter (HOSPITAL_COMMUNITY): Payer: Self-pay | Admitting: Emergency Medicine

## 2019-11-20 ENCOUNTER — Emergency Department (HOSPITAL_COMMUNITY)
Admission: EM | Admit: 2019-11-20 | Discharge: 2019-11-22 | Disposition: A | Discharge status: Home / Self Care | Payer: Medicare Other | Attending: Emergency Medicine | Admitting: Emergency Medicine

## 2019-11-20 ENCOUNTER — Other Ambulatory Visit: Payer: Self-pay

## 2019-11-20 DIAGNOSIS — Z20822 Contact with and (suspected) exposure to covid-19: Secondary | ICD-10-CM | POA: Diagnosis not present

## 2019-11-20 DIAGNOSIS — Z79899 Other long term (current) drug therapy: Secondary | ICD-10-CM | POA: Diagnosis not present

## 2019-11-20 DIAGNOSIS — F23 Brief psychotic disorder: Secondary | ICD-10-CM | POA: Diagnosis not present

## 2019-11-20 DIAGNOSIS — F209 Schizophrenia, unspecified: Secondary | ICD-10-CM | POA: Diagnosis present

## 2019-11-20 DIAGNOSIS — F17210 Nicotine dependence, cigarettes, uncomplicated: Secondary | ICD-10-CM | POA: Insufficient documentation

## 2019-11-20 DIAGNOSIS — F29 Unspecified psychosis not due to a substance or known physiological condition: Secondary | ICD-10-CM | POA: Diagnosis not present

## 2019-11-20 LAB — COMPREHENSIVE METABOLIC PANEL
ALT: 25 U/L (ref 0–44)
AST: 21 U/L (ref 15–41)
Albumin: 4 g/dL (ref 3.5–5.0)
Alkaline Phosphatase: 56 U/L (ref 38–126)
Anion gap: 7 (ref 5–15)
BUN: 15 mg/dL (ref 6–20)
CO2: 24 mmol/L (ref 22–32)
Calcium: 9.4 mg/dL (ref 8.9–10.3)
Chloride: 108 mmol/L (ref 98–111)
Creatinine, Ser: 0.97 mg/dL (ref 0.61–1.24)
GFR, Estimated: >60 mL/min (ref >60)
Glucose, Bld: 98 mg/dL (ref 70–99)
Potassium: 3.9 mmol/L (ref 3.5–5.1)
Sodium: 139 mmol/L (ref 135–145)
Total Bilirubin: 0.6 mg/dL (ref 0.3–1.2)
Total Protein: 6.7 g/dL (ref 6.5–8.1)

## 2019-11-20 LAB — CBC
HCT: 46.9 % (ref 39.0–52.0)
Hemoglobin: 15.1 g/dL (ref 13.0–17.0)
MCH: 29.1 pg (ref 26.0–34.0)
MCHC: 32.2 g/dL (ref 30.0–36.0)
MCV: 90.4 fL (ref 80.0–100.0)
Platelets: 193 10*3/uL (ref 150–400)
RBC: 5.19 MIL/uL (ref 4.22–5.81)
RDW: 13.3 % (ref 11.5–15.5)
WBC: 4.4 10*3/uL (ref 4.0–10.5)
nRBC: 0 % (ref 0.0–0.2)

## 2019-11-20 LAB — ACETAMINOPHEN LEVEL: Acetaminophen (Tylenol), Serum: <10 ug/mL — ABNORMAL LOW (ref 10–30)

## 2019-11-20 LAB — RAPID URINE DRUG SCREEN, HOSP PERFORMED
Amphetamines: NOT DETECTED
Barbiturates: NOT DETECTED
Benzodiazepines: NOT DETECTED
Cocaine: NOT DETECTED
Opiates: NOT DETECTED
Tetrahydrocannabinol: NOT DETECTED

## 2019-11-20 LAB — SALICYLATE LEVEL: Salicylate Lvl: <7 mg/dL — ABNORMAL LOW (ref 7.0–30.0)

## 2019-11-20 LAB — ETHANOL: Alcohol, Ethyl (B): <10 mg/dL (ref <10)

## 2019-11-20 NOTE — ED Notes (Signed)
Pt returned to hallway bed by [employer] in handcuffs.
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
## 2019-11-20 NOTE — ED Notes (Addendum)
Pt back to bed by [facility]. Called staffing for a sitter.

## 2019-11-20 NOTE — ED Notes (Signed)
Pt resting in the bed, [employer] no longer present.

## 2019-11-20 NOTE — ED Notes (Signed)
Unable to locate patient, security notified, [facility] PD notified.

## 2019-11-21 MED ORDER — ACETAMINOPHEN 325 MG PO TABS: 650.0000 mg | ORAL_TABLET | ORAL | Status: DC | PRN

## 2019-11-21 MED ORDER — ONDANSETRON HCL 4 MG PO TABS: 4.0000 mg | ORAL_TABLET | Freq: Three times a day (TID) | ORAL | Status: DC | PRN

## 2019-11-21 MED ORDER — BUPROPION HCL ER (SR) 150 MG PO TB12
150.0000 mg | ORAL_TABLET | Freq: Two times a day (BID) | ORAL | Status: DC
  Administered 2019-11-21 – 2019-11-22 (×4): 150 mg via ORAL
  Filled 2019-11-21 (×4): qty 1

## 2019-11-21 MED ORDER — FAMOTIDINE 20 MG PO TABS
20.0000 mg | ORAL_TABLET | Freq: Every day | ORAL | Status: DC
  Administered 2019-11-21 – 2019-11-22 (×2): 20 mg via ORAL
  Filled 2019-11-21 (×2): qty 1

## 2019-11-21 MED ORDER — BENZTROPINE MESYLATE 1 MG PO TABS
0.5000 mg | ORAL_TABLET | Freq: Two times a day (BID) | ORAL | Status: DC
  Administered 2019-11-21 – 2019-11-22 (×4): 0.5 mg via ORAL
  Filled 2019-11-21 (×4): qty 1

## 2019-11-21 MED ORDER — NICOTINE 14 MG/24HR TD PT24
14.0000 mg | MEDICATED_PATCH | Freq: Every day | TRANSDERMAL | Status: DC
  Filled 2019-11-21: qty 1

## 2019-11-21 MED ORDER — LITHIUM CARBONATE ER 450 MG PO TBCR
450.0000 mg | EXTENDED_RELEASE_TABLET | Freq: Two times a day (BID) | ORAL | Status: DC
  Administered 2019-11-21 – 2019-11-22 (×4): 450 mg via ORAL
  Filled 2019-11-21 (×4): qty 1

## 2019-11-21 MED ORDER — ZOLPIDEM TARTRATE 5 MG PO TABS: 5.0000 mg | ORAL_TABLET | Freq: Every evening | ORAL | Status: DC | PRN

## 2019-11-21 MED ORDER — ALUM & MAG HYDROXIDE-SIMETH 200-200-20 MG/5ML PO SUSP: 30.0000 mL | Freq: Four times a day (QID) | ORAL | Status: DC | PRN

## 2019-11-21 MED ORDER — HALOPERIDOL 5 MG PO TABS
5.0000 mg | ORAL_TABLET | ORAL | Status: DC | PRN
  Administered 2019-11-21 – 2019-11-22 (×7): 5 mg via ORAL
  Filled 2019-11-21 (×7): qty 1

## 2019-11-21 MED ORDER — METFORMIN HCL 500 MG PO TABS
500.0000 mg | ORAL_TABLET | Freq: Two times a day (BID) | ORAL | Status: DC
  Administered 2019-11-21 – 2019-11-22 (×4): 500 mg via ORAL
  Filled 2019-11-21 (×4): qty 1

## 2019-11-21 MED ORDER — ARIPIPRAZOLE 10 MG PO TABS
20.0000 mg | ORAL_TABLET | Freq: Every day | ORAL | Status: DC
  Administered 2019-11-21 – 2019-11-22 (×2): 20 mg via ORAL
  Filled 2019-11-21 (×3): qty 2

## 2019-11-21 NOTE — ED Notes (Signed)
Patient was given a snack and drink. 
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
## 2019-11-21 NOTE — ED Notes (Signed)
Lunch Tray Ordered @ 1042. 

## 2019-11-21 NOTE — ED Notes (Signed)
Pt found by security escorted back to his bed.

## 2019-11-21 NOTE — ED Notes (Signed)
Pt moved from hallway 20 to purple 51
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
## 2019-11-21 NOTE — ED Notes (Signed)
Pt arrived back from having video visit with sitter and within a few minutes the sitter noticed he has walked off, Security immediately notified and actively looking for him now. Will contact [employer] if not found in hospital

## 2019-11-21 NOTE — ED Notes (Signed)
Pt beginning to pace back in forth in hallway, MD aware and request for medication. Pt has received no medications

## 2019-11-21 NOTE — ED Notes (Signed)
Pt given breakfast tray

## 2019-11-21 NOTE — ED Notes (Signed)
Pt given lunch tray.

## 2019-11-21 NOTE — ED Notes (Signed)
Pt pushed through sitter computer sitting at patient doorattempted to run from ED out through main lobby. Sitter and NT Erskine Squibb followed pt into lobby and security made aware. Pt escorted back to room with security and provider made aware.

## 2019-11-21 NOTE — ED Notes (Signed)
Pt attempted to run off. Pt made it to the ED lobby with this writer using STARR technique to redirect pt back to his room. Security and this Clinical research associate walked pt back to the room. Pt now back in room pacing and talking to himself.

## 2019-11-21 NOTE — ED Notes (Signed)
Diet ordered for Breakfast. 

## 2019-11-21 NOTE — ED Notes (Signed)
Pt given snack. 

## 2019-11-21 NOTE — ED Notes (Signed)
Pt becomes aggressive when asking to check his vitals, unable to obtain at this time.

## 2019-11-22 ENCOUNTER — Inpatient Hospital Stay (HOSPITAL_COMMUNITY)
Admission: AD | Admit: 2019-11-22 | Discharge: 2019-12-02 | DRG: 885 | Disposition: A | Discharge status: Home / Self Care | Payer: Medicare Other | Source: Intra-hospital | Attending: Psychiatry | Admitting: Psychiatry

## 2019-11-22 DIAGNOSIS — F419 Anxiety disorder, unspecified: Secondary | ICD-10-CM | POA: Diagnosis present

## 2019-11-22 DIAGNOSIS — Z833 Family history of diabetes mellitus: Secondary | ICD-10-CM

## 2019-11-22 DIAGNOSIS — Z91018 Allergy to other foods: Secondary | ICD-10-CM

## 2019-11-22 DIAGNOSIS — Z9114 Patient's other noncompliance with medication regimen: Secondary | ICD-10-CM

## 2019-11-22 DIAGNOSIS — Z79899 Other long term (current) drug therapy: Secondary | ICD-10-CM

## 2019-11-22 DIAGNOSIS — F29 Unspecified psychosis not due to a substance or known physiological condition: Secondary | ICD-10-CM | POA: Diagnosis not present

## 2019-11-22 DIAGNOSIS — Z7984 Long term (current) use of oral hypoglycemic drugs: Secondary | ICD-10-CM

## 2019-11-22 DIAGNOSIS — Z8042 Family history of malignant neoplasm of prostate: Secondary | ICD-10-CM | POA: Diagnosis not present

## 2019-11-22 DIAGNOSIS — F23 Brief psychotic disorder: Secondary | ICD-10-CM | POA: Diagnosis not present

## 2019-11-22 DIAGNOSIS — K219 Gastro-esophageal reflux disease without esophagitis: Secondary | ICD-10-CM | POA: Diagnosis present

## 2019-11-22 DIAGNOSIS — F17210 Nicotine dependence, cigarettes, uncomplicated: Secondary | ICD-10-CM | POA: Diagnosis present

## 2019-11-22 DIAGNOSIS — F209 Schizophrenia, unspecified: Principal | ICD-10-CM | POA: Diagnosis present

## 2019-11-22 DIAGNOSIS — F200 Paranoid schizophrenia: Secondary | ICD-10-CM | POA: Diagnosis not present

## 2019-11-22 DIAGNOSIS — G4700 Insomnia, unspecified: Secondary | ICD-10-CM | POA: Diagnosis present

## 2019-11-22 DIAGNOSIS — Z888 Allergy status to other drugs, medicaments and biological substances status: Secondary | ICD-10-CM | POA: Diagnosis not present

## 2019-11-22 DIAGNOSIS — F329 Major depressive disorder, single episode, unspecified: Secondary | ICD-10-CM | POA: Diagnosis not present

## 2019-11-22 DIAGNOSIS — E119 Type 2 diabetes mellitus without complications: Secondary | ICD-10-CM | POA: Diagnosis present

## 2019-11-22 LAB — RESPIRATORY PANEL BY RT PCR (FLU A&B, COVID)
Influenza A by PCR: NEGATIVE
Influenza B by PCR: NEGATIVE
SARS Coronavirus 2 by RT PCR: NEGATIVE

## 2019-11-22 NOTE — ED Notes (Signed)
Ordered breakfast 

## 2019-11-22 NOTE — ED Notes (Signed)
Mother called to get an update on pt. Mother made aware of pt placement.

## 2019-11-22 NOTE — Progress Notes (Signed)
Pt accepted to [facility], bed 501-2     Elenore Paddy, NP is the accepting provider.    Dr. Jola Babinski is the attending provider.    Call report to [phone]    [facility] @ [facility] ED notified.     Pt is involuntary and will be transported by law enforcement.   Pt is scheduled to arrive at [facility] at 11pm.    Wells Guiles, MSW, LCSW, LCAS Clinical Social Worker II Disposition CSW [phone]

## 2019-11-22 NOTE — ED Notes (Signed)
Patient pacing his room and continues to peep out the door to view surroundings; No sitter available at this time. RN and NT in unit watching patient due to flight risk-Monique,RN

## 2019-11-22 NOTE — ED Notes (Signed)
[employer] called for transport to [facility]

## 2019-11-22 NOTE — ED Notes (Signed)
Patient begin passing the room and walking back and forth to the bathroom; Pt searching unit for exits; Pt asked staff "why is there always somebody here"; RN administered PRN med due to increased anxiety observed; Pt exhibiting behavior prior to running several other times while here in the ED this visit -Monique,RN

## 2019-11-22 NOTE — ED Notes (Signed)
Pt continues to pace in room, noted talking to self, restless, agitated. Encouragement and support provided. Sitter present. Will continue to monitor.

## 2019-11-22 NOTE — ED Notes (Signed)
Dinner tray ordered.

## 2019-11-22 NOTE — BH Assessment (Signed)
BHH Assessment Progress Note This Clinical research associate was seen by this Clinical research associate to assess current mental health status. Patient continues to be disorganized and speaks at length in reference to topics unrelated to this writer's questions. Patient denies any S/I or H/I although reports ongoing AVH. Patient is vague in reference to content, it appears at times that patient is responding to internal stimuli as evidenced by patient looking away from this writer and talking to himself. Thomas NP recommends a continued inpatient admission to assist with stabilization.

## 2019-11-23 ENCOUNTER — Other Ambulatory Visit: Payer: Self-pay

## 2019-11-23 ENCOUNTER — Encounter (HOSPITAL_COMMUNITY): Payer: Self-pay | Admitting: Psychiatry

## 2019-11-23 DIAGNOSIS — F209 Schizophrenia, unspecified: Principal | ICD-10-CM

## 2019-11-23 LAB — LIPID PANEL
Cholesterol: 153 mg/dL (ref 0–200)
HDL: 58 mg/dL (ref >40)
LDL Cholesterol: 86 mg/dL (ref 0–99)
Total CHOL/HDL Ratio: 2.6 RATIO
Triglycerides: 46 mg/dL (ref <150)
VLDL: 9 mg/dL (ref 0–40)

## 2019-11-23 LAB — LITHIUM LEVEL: Lithium Lvl: 0.29 mmol/L — ABNORMAL LOW (ref 0.60–1.20)

## 2019-11-23 LAB — TSH: TSH: 0.562 u[IU]/mL (ref 0.350–4.500)

## 2019-11-23 MED ORDER — ZOLPIDEM TARTRATE 5 MG PO TABS
5.0000 mg | ORAL_TABLET | Freq: Every evening | ORAL | Status: DC | PRN
  Administered 2019-11-23: 5 mg via ORAL
  Filled 2019-11-23 (×2): qty 1

## 2019-11-23 MED ORDER — BUPROPION HCL ER (SR) 150 MG PO TB12
150.0000 mg | ORAL_TABLET | Freq: Two times a day (BID) | ORAL | Status: DC
  Administered 2019-11-23 – 2019-11-26 (×7): 150 mg via ORAL
  Filled 2019-11-23 (×9): qty 1

## 2019-11-23 MED ORDER — ONDANSETRON HCL 4 MG PO TABS: 4.0000 mg | ORAL_TABLET | Freq: Three times a day (TID) | ORAL | Status: DC | PRN

## 2019-11-23 MED ORDER — ARIPIPRAZOLE 10 MG PO TABS
20.0000 mg | ORAL_TABLET | Freq: Every day | ORAL | Status: DC
  Administered 2019-11-23 – 2019-11-27 (×5): 20 mg via ORAL
  Filled 2019-11-23 (×6): qty 2

## 2019-11-23 MED ORDER — FAMOTIDINE 20 MG PO TABS
20.0000 mg | ORAL_TABLET | Freq: Every day | ORAL | Status: DC
  Administered 2019-11-23 – 2019-12-02 (×10): 20 mg via ORAL
  Filled 2019-11-23 (×12): qty 1

## 2019-11-23 MED ORDER — LITHIUM CARBONATE ER 450 MG PO TBCR
450.0000 mg | EXTENDED_RELEASE_TABLET | Freq: Two times a day (BID) | ORAL | Status: DC
  Administered 2019-11-23 – 2019-12-02 (×18): 450 mg via ORAL
  Filled 2019-11-23 (×22): qty 1

## 2019-11-23 MED ORDER — BENZTROPINE MESYLATE 0.5 MG PO TABS
0.5000 mg | ORAL_TABLET | Freq: Two times a day (BID) | ORAL | Status: DC
  Administered 2019-11-23 – 2019-12-02 (×18): 0.5 mg via ORAL
  Filled 2019-11-23 (×21): qty 1

## 2019-11-23 MED ORDER — ACETAMINOPHEN 325 MG PO TABS: 650.0000 mg | ORAL_TABLET | ORAL | Status: DC | PRN

## 2019-11-23 MED ORDER — ALUM & MAG HYDROXIDE-SIMETH 200-200-20 MG/5ML PO SUSP: 30.0000 mL | Freq: Four times a day (QID) | ORAL | Status: DC | PRN

## 2019-11-23 MED ORDER — NICOTINE 14 MG/24HR TD PT24
14.0000 mg | MEDICATED_PATCH | Freq: Every day | TRANSDERMAL | Status: DC
  Filled 2019-11-23 (×11): qty 1

## 2019-11-23 MED ORDER — METFORMIN HCL 500 MG PO TABS
500.0000 mg | ORAL_TABLET | Freq: Two times a day (BID) | ORAL | Status: DC
  Administered 2019-11-23 – 2019-11-28 (×10): 500 mg via ORAL
  Filled 2019-11-23 (×15): qty 1

## 2019-11-23 NOTE — Progress Notes (Addendum)
   11/23/19 1935  Psych Admission Type (Psych Patients Only)  Admission Status Involuntary  Psychosocial Assessment  Patient Complaints None  Eye Contact Brief  Facial Expression Anxious  Affect Anxious  Speech Pressured;Rapid  Interaction Guarded  Motor Activity Restless;Pacing  Appearance/Hygiene Disheveled  Behavior Characteristics Cooperative;Guarded  Mood Preoccupied;Suspicious  Thought Process  Coherency Concrete thinking;Disorganized  Content WDL  Delusions None reported or observed  Perception UTA  Hallucination None reported or observed  Judgment Impaired  Confusion UTA  Danger to Self  Current suicidal ideation? Denies  Danger to Others  Danger to Others None reported or observed   Pt seen pacing the halls. Pt has been pacing most of the day according to shift-shift report. Pt denies SI, HI, AVH, pain. Pt still pacing but not a behavior problem.
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
## 2019-11-23 NOTE — BHH Group Notes (Signed)
Patient did not attend orientation group.  
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
## 2019-11-23 NOTE — Tx Team (Signed)
Initial Treatment Plan 11/23/2019 1:26 AM [name] NOT:[id number]    PATIENT STRESSORS: Marital or family conflict Medication change or noncompliance   PATIENT STRENGTHS: Average or above average intelligence Supportive family/friends   PATIENT IDENTIFIED PROBLEMS: anxiety  psychosis  Medication noncompliant  "I need to get back on my medication"               DISCHARGE CRITERIA:  Improved stabilization in mood, thinking, and/or behavior Need for constant or close observation no longer present Verbal commitment to aftercare and medication compliance  PRELIMINARY DISCHARGE PLAN: Attend aftercare/continuing care group Participate in family therapy Return to previous living arrangement  PATIENT/FAMILY INVOLVEMENT: This treatment plan has been presented to and reviewed with the patient, James [name],  The patient and family have been given the opportunity to ask questions and make suggestions.  JEHU-APPIAH, Salley Scarlet, RN 11/23/2019, 1:26 AM
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
## 2019-11-23 NOTE — Progress Notes (Signed)
DAR NOTE: Patient presents with anxious affect and mood.  Denies suicidal thoughts, auditory and visual hallucinations.  Patient  observed pacing the hallway throughout this shift, responding to internal stimuli and mumbling to himself.  Described energy level as normal and concentration as good.  Rates depression at 2, hopelessness at 3, and anxiety at 0.  Maintained on routine safety checks.  Medications given as prescribed.  Support and encouragement offered as needed.  States goal for today is "my peaceful attitude."  Patient is safe on and off the unit.
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
## 2019-11-24 LAB — PROLACTIN: Prolactin: 3.3 ng/mL — ABNORMAL LOW (ref 4.0–15.2)

## 2019-11-24 LAB — HEMOGLOBIN A1C
Hgb A1c MFr Bld: 5.8 % — ABNORMAL HIGH (ref 4.8–5.6)
Mean Plasma Glucose: 120 mg/dL

## 2019-11-24 NOTE — Progress Notes (Signed)
Pt now openly pacing in the hallways and mumbling to himself. States that he is okay this morning.

## 2019-11-24 NOTE — Progress Notes (Signed)
Adult Psychoeducational Group Note  Date:  11/24/2019 Time:  12:30 AM  Group Topic/Focus:  Wrap-Up Group:   The focus of this group is to help patients review their daily goal of treatment and discuss progress on daily workbooks.  Participation Level:  Minimal  Participation Quality:  Appropriate  Affect:  Appropriate  Cognitive:  Disorganized and Confused  Insight: Limited  Engagement in Group:  Limited  Modes of Intervention:  Discussion  Additional Comments:  Pt stated his goal for today was to focus on his treatment plan. Pt stated he accomplished his goal today. Pt stated he was able to talk with his doctor and social worker about his care today. Pt rated his overall day an 8 out of 10. Pt stated he felt better about himself today. Pt stated his relationship with his family and support system needs to be improved Pt stated he was able to attend all meals. Pt stated he took all medications provided today.  Pt stated his appetite was pretty good today. Pt rated sleep last night was pretty good. Pt stated he was no physical pain today.  Pt deny auditory or visual hallucinations. Pt denies thoughts of harming himself or others. Pt stated he would alert staff if anything changes.  Felipa Furnace 11/24/2019, 12:30 AM

## 2019-11-24 NOTE — [facility] Group Notes (Signed)
[facility] LCSW Group Therapy  11/24/2019 2:51 PM  Type of Therapy:  Coping Skills  Participation Level:  Did Not Attend   Summary of Progress/Problems: This patient was invited to attend group, however this patient chose not to attend.    Charmine Bockrath A Janyce Ellinger 11/24/2019, 2:51 PM  

## 2019-11-24 NOTE — Progress Notes (Signed)
[facility] MD Progress Note  11/24/2019 11:07 AM James [name]  MRN:  [id number]     Daily note: Patient seen, chart reviewed, case discussed with treatment team.  Patient continues to pace the halls and attend minimal groups.  He is seen talking to himself, but denies any auditory hallucinations when asked.  Patient also denies any suicidal ideation or homicidal ideation.  He is compliant with medications, and eating his meals without issue.  Sleep remains poor at approximately 4 hours last night.  No side effects of the medication reported or observed.      Principal Problem: <principal problem not specified> Diagnosis: Active Problems:   Schizophrenia (HCC)  Total Time spent with patient: 20 minutes  Past Psychiatric History: See HPI  Past Medical History:  Past Medical History:  Diagnosis Date  . Gastroesophageal reflux disease 11/25/2017   History reviewed. No pertinent surgical history. Family History:  Family History  Problem Relation Age of Onset  . Diabetes Mother   . Diabetes Father   . Prostate cancer Father    Family Psychiatric  History: See HPI Social History:  Social History   Substance and Sexual Activity  Alcohol Use No  . Alcohol/week: 0.0 standard drinks   Comment: rare alcohol     Social History   Substance and Sexual Activity  Drug Use No    Social History   Socioeconomic History  . Marital status: Single    Spouse name: Not on file  . Number of children: Not on file  . Years of education: Not on file  . Highest education level: Not on file  Occupational History  . Not on file  Tobacco Use  . Smoking status: Current Every Day Smoker    Packs/day: 0.50    Types: Cigarettes  . Smokeless tobacco: Never Used  Substance and Sexual Activity  . Alcohol use: No    Alcohol/week: 0.0 standard drinks    Comment: rare alcohol  . Drug use: No  . Sexual activity: Yes    Comment: male  Other Topics Concern  . Not on file  Social History  Narrative  . Not on file   Social Determinants of Health   Financial Resource Strain:   . Difficulty of Paying Living Expenses: Not on file  Food Insecurity:   . Worried About Programme researcher, broadcasting/film/video in the Last Year: Not on file  . Ran Out of Food in the Last Year: Not on file  Transportation Needs:   . Lack of Transportation (Medical): Not on file  . Lack of Transportation (Non-Medical): Not on file  Physical Activity:   . Days of Exercise per Week: Not on file  . Minutes of Exercise per Session: Not on file  Stress:   . Feeling of Stress : Not on file  Social Connections:   . Frequency of Communication with Friends and Family: Not on file  . Frequency of Social Gatherings with Friends and Family: Not on file  . Attends Religious Services: Not on file  . Active Member of Clubs or Organizations: Not on file  . Attends Banker Meetings: Not on file  . Marital Status: Not on file   Additional Social History:                         Sleep: Poor  Appetite:  Good  Current Medications: Current Facility-Administered Medications  Medication Dose Route Frequency Provider Last Rate Last Admin  . acetaminophen (  TYLENOL) tablet 650 mg  650 mg Oral Q4H PRN Denzil Magnuson, NP      . alum & mag hydroxide-simeth (MAALOX/MYLANTA) 200-200-20 MG/5ML suspension 30 mL  30 mL Oral Q6H PRN Denzil Magnuson, NP      . ARIPiprazole (ABILIFY) tablet 20 mg  20 mg Oral Daily Denzil Magnuson, NP   20 mg at 11/24/19 0814  . benztropine (COGENTIN) tablet 0.5 mg  0.5 mg Oral BID Denzil Magnuson, NP   0.5 mg at 11/24/19 0814  . buPROPion [facility] SR) 12 hr tablet 150 mg  150 mg Oral BID Denzil Magnuson, NP   150 mg at 11/24/19 0814  . famotidine (PEPCID) tablet 20 mg  20 mg Oral Daily Denzil Magnuson, NP   20 mg at 11/24/19 0814  . lithium carbonate (ESKALITH) CR tablet 450 mg  450 mg Oral BID Denzil Magnuson, NP   450 mg at 11/24/19 0814  . metFORMIN (GLUCOPHAGE) tablet 500  mg  500 mg Oral BID WC Denzil Magnuson, NP   500 mg at 11/24/19 1031  . nicotine (NICODERM CQ - dosed in mg/24 hours) patch 14 mg  14 mg Transdermal Daily Denzil Magnuson, NP      . ondansetron [facility]) tablet 4 mg  4 mg Oral Q8H PRN Denzil Magnuson, NP      . zolpidem (AMBIEN) tablet 5 mg  5 mg Oral QHS PRN Denzil Magnuson, NP   5 mg at 11/23/19 2034    Lab Results:  Results for orders placed or performed during the hospital encounter of 11/22/19 (from the past 48 hour(s))  Hemoglobin A1c     Status: Abnormal   Collection Time: 11/23/19  6:47 AM  Result Value Ref Range   Hgb A1c MFr Bld 5.8 (H) 4.8 - 5.6 %    Comment: (NOTE)         Prediabetes: 5.7 - 6.4         Diabetes: >6.4         Glycemic control for adults with diabetes: <7.0    Mean Plasma Glucose 120 mg/dL    Comment: (NOTE) Performed At: [facility] [address] [place], [place] [id number] Jolene Schimke MD [id number]   Lipid panel     Status: None   Collection Time: 11/23/19  6:47 AM  Result Value Ref Range   Cholesterol 153 0 - 200 mg/dL   Triglycerides 46 <116 mg/dL   HDL 58 >57 mg/dL   Total CHOL/HDL Ratio 2.6 RATIO   VLDL 9 0 - 40 mg/dL   LDL Cholesterol 86 0 - 99 mg/dL    Comment:        Total Cholesterol/HDL:CHD Risk Coronary Heart Disease Risk Table                     Men   Women  1/2 Average Risk   3.4   3.3  Average Risk       5.0   4.4  2 X Average Risk   9.6   7.1  3 X Average Risk  23.4   11.0        Use the calculated Patient Ratio above and the CHD Risk Table to determine the patient's CHD Risk.        ATP III CLASSIFICATION (LDL):  <100     mg/dL   Optimal  903-833  mg/dL   Near or Above  Optimal  130-159  mg/dL   Borderline  696-295  mg/dL   High  >284     mg/dL   Very High Performed at [facility], [address], [place], [place] [address]   Lithium level     Status: Abnormal   Collection Time: 11/23/19  6:47 AM  Result Value  Ref Range   Lithium Lvl 0.29 (L) 0.60 - 1.20 mmol/L    Comment: Performed at [facility], [address], [place], [place] [address]  Prolactin     Status: Abnormal   Collection Time: 11/23/19  6:47 AM  Result Value Ref Range   Prolactin 3.3 (L) 4.0 - 15.2 ng/mL    Comment: (NOTE) Performed At: [facility] [address] [place], [place] [id number] Jolene Schimke MD [id number]   TSH     Status: None   Collection Time: 11/23/19  6:47 AM  Result Value Ref Range   TSH 0.562 0.350 - 4.500 uIU/mL    Comment: Performed by a 3rd Generation assay with a functional sensitivity of <=0.01 uIU/mL. Performed at [facility], [address] [place], [place] [address]     Blood Alcohol level:  Lab Results  Component Value Date   ETH <10 11/20/2019   ETH <10 06/27/2018    Metabolic Disorder Labs: Lab Results  Component Value Date   HGBA1C 5.8 (H) 11/23/2019   MPG 120 11/23/2019   MPG 111.15 06/27/2018   Lab Results  Component Value Date   PROLACTIN 3.3 (L) 11/23/2019   Lab Results  Component Value Date   CHOL 153 11/23/2019   TRIG 46 11/23/2019   HDL 58 11/23/2019   CHOLHDL 2.6 11/23/2019   VLDL 9 11/23/2019   LDLCALC 86 11/23/2019   LDLCALC 72 06/27/2018    Physical Findings: AIMS: Facial and Oral Movements Muscles of Facial Expression: None, normal Lips and Perioral Area: None, normal Jaw: None, normal Tongue: None, normal,Extremity Movements Upper (arms, wrists, hands, fingers): None, normal Lower (legs, knees, ankles, toes): None, normal, Trunk Movements Neck, shoulders, hips: None, normal, Overall Severity Severity of abnormal movements (highest score from questions above): None, normal Incapacitation due to abnormal movements: None, normal Patient's awareness of abnormal movements (rate only patient's report): No Awareness, Dental Status Current problems with teeth and/or dentures?: No Does patient usually wear  dentures?: No  CIWA:    COWS:     Musculoskeletal: Strength & Muscle Tone: within normal limits Gait & Station: normal Patient leans: N/A  Psychiatric Specialty Exam: Physical Exam  Review of Systems  All other systems reviewed and are negative.   Blood pressure 106/83, pulse 76, temperature (!) 97.4 F (36.3 C), temperature source Oral, resp. rate 18, height 5\' 11"  (1.803 m), weight 72.1 kg, SpO2 99 %.Body mass index is 22.18 kg/m.  General Appearance: Disheveled  Eye Contact:  Good  Speech:  Slow  Volume:  Normal  Mood:  "OK"  Affect:  Constricted and Flat  Thought Process:  Goal Directed  Orientation:  Full (Time, Place, and Person)  Thought Content:  Logical  Suicidal Thoughts:  No  Homicidal Thoughts:  No  Memory:  Recent;   Fair  Judgement:  Impaired  Insight:  Present  Psychomotor Activity:  Increased and Restlessness  Concentration:  Concentration: Fair  Recall:  of Knowledge:  Fair  Language:  Fair  Akathisia:  No  Handed:  Right  AIMS (if indicated):     Assets:  Desire  for Improvement Leisure Time Physical Health Social Support  ADL's:  Impaired  Cognition:  WNL  Sleep:  Number of Hours: 3.75     Treatment Plan Summary: Daily contact with patient to assess and evaluate symptoms and progress in treatment and Medication management   Assessment: Patient continues to display bizarre and psychotic behavior like internal preoccupation and his constant pacing.  He will benefit from continued inpatient stay for safety, stabilization, medication management.  Patient has been in good behavioral control despite this and is compliant with the medication.  There is some concern that patient may be experiencing akathisia.  Patient has preferred to stay on his home regimen as opposed to switching agents.  We will continue to monitor patient on this regimen to ensure symptom resolution.  Ideally, patient will be able to be stabilized with just reinstatement  of his home regimen, but if need be, we will consider other second-generation antipsychotics to alleviate symptoms.  Plan: Continue current medication regimen Abilify 20 mg p.o. every morning Bupropion 150 twice daily Lithium 450 twice daily Ambien 5 mg p.o. nightly  Clement SayresPaul A Myeesha Shane, MD 11/24/2019, 11:07 AM

## 2019-11-24 NOTE — Progress Notes (Signed)
   11/24/19 1945  Psych Admission Type (Psych Patients Only)  Admission Status Involuntary  Psychosocial Assessment  Patient Complaints None  Eye Contact Brief  Facial Expression Anxious  Affect Anxious  Speech Pressured;Rapid  Interaction Guarded  Motor Activity Restless;Pacing  Appearance/Hygiene Disheveled  Behavior Characteristics Cooperative;Fidgety;Guarded  Mood Preoccupied  Thought Administrator, sports thinking;Disorganized  Content WDL  Delusions None reported or observed  Perception Hallucinations  Hallucination None reported or observed  Judgment Impaired  Confusion UTA  Danger to Self  Current suicidal ideation? Denies  Danger to Others  Danger to Others None reported or observed   Pt still pacing up and down the hallway. Pt does stop to speak with this Clinical research associate for a few minutes. Pt denies SI, HI, AVH and pain. Pt restless, guarded and anxious.

## 2019-11-24 NOTE — ED Provider Notes (Signed)
[facility] EMERGENCY DEPARTMENT Provider Note   CSN: [id number] Arrival date & time: 11/20/19  8341     Late chart completion, computer error   History Chief Complaint  Patient presents with  . IVC    James [name] is a 39 y.o. male.  HPI    Patient with schizophrenia, level 5 caveat for Patient arrives via [employer], under involuntary commitment.  Patient self cannot provide any details of his presentation. Reportedly, the patient has been talking himself, reacting to nonpresent individuals, has been violent with his family and delusional. The patient himself cannot provide any useful details. Past Medical History:  Diagnosis Date  . Gastroesophageal reflux disease 11/25/2017    Patient Active Problem List   Diagnosis Date Noted  . Gastroesophageal reflux disease 11/25/2017  . Schizophrenia (HCC) 11/25/2017    History reviewed. No pertinent surgical history.     Family History  Problem Relation Age of Onset  . Diabetes Mother   . Diabetes Father   . Prostate cancer Father     Social History   Tobacco Use  . Smoking status: Current Every Day Smoker    Packs/day: 0.50    Types: Cigarettes  . Smokeless tobacco: Never Used  Substance Use Topics  . Alcohol use: No    Alcohol/week: 0.0 standard drinks    Comment: rare alcohol  . Drug use: No    Home Medications Prior to Admission medications   Medication Sig Start Date End Date Taking? Authorizing Provider  ARIPiprazole (ABILIFY) 20 MG tablet Take 20 mg by mouth daily.   Yes [provider]  benztropine (COGENTIN) 0.5 MG tablet Take 0.5 mg by mouth 2 (two) times daily. 08/04/18  Yes [provider]  buPROPion (ZYBAN) 150 MG 12 hr tablet Take 150 mg by mouth 2 (two) times daily. 08/04/18  Yes [provider]  lithium carbonate (ESKALITH) 450 MG CR tablet Take 450 mg by mouth 2 (two) times daily. 08/04/18  Yes [provider]  metFORMIN (GLUCOPHAGE) 500 MG  tablet TAKE 1 TABLET BY MOUTH TWICE DAILY WITH MEALS Patient taking differently: Take 500 mg by mouth 2 (two) times daily with a meal.  09/20/19  Yes Saguier, [name], PA-C  famotidine (PEPCID) 20 MG tablet Take 1 tablet (20 mg total) by mouth daily. Patient not taking: Reported on 11/21/2019 12/21/18   Saguier, [name], PA-C    Allergies    Clozapine and Mango flavor  Review of Systems   Review of Systems  Unable to perform ROS: Psychiatric disorder    Physical Exam Updated Vital Signs BP (!) 146/118 (BP Location: Left Arm)   Pulse (!) 101   Temp 97.8 F (36.6 C) (Oral)   Resp 16   SpO2 96%   Physical Exam Vitals and nursing note reviewed.  Constitutional:      General: He is not in acute distress.    Appearance: He is well-developed.  HENT:     Head: Normocephalic and atraumatic.  Eyes:     Conjunctiva/sclera: Conjunctivae normal.  Pulmonary:     Effort: Pulmonary effort is normal. No respiratory distress.     Breath sounds: No stridor.  Abdominal:     General: There is no distension.  Skin:    General: Skin is warm and dry.  Neurological:     Mental Status: He is alert and oriented to person, place, and time.  Psychiatric:        Attention and Perception: He is inattentive.  Speech: He is noncommunicative.        Cognition and Memory: Cognition is impaired. Memory is impaired.     ED Results / Procedures / Treatments   Labs (all labs ordered are listed, but only abnormal results are displayed) Labs Reviewed  SALICYLATE LEVEL - Abnormal; Notable for the following components:      Result Value   Salicylate Lvl <7.0 (*)    All other components within normal limits  ACETAMINOPHEN LEVEL - Abnormal; Notable for the following components:   Acetaminophen (Tylenol), Serum <10 (*)    All other components within normal limits  RESPIRATORY PANEL BY RT PCR (FLU A&B, COVID)  COMPREHENSIVE METABOLIC PANEL  ETHANOL  CBC  RAPID URINE DRUG SCREEN, HOSP PERFORMED      Procedures Procedures (including critical care time)  Medications Ordered in ED Medications - No data to display  ED Course  I have reviewed the triage vital signs and the nursing notes.  Pertinent labs & imaging results that were available during my care of the patient were reviewed by me and considered in my medical decision making (see chart for details).  Thin adult male with history of schizophrenia, reportedly not taking his medication presents under involuntary commitment due to labile behavior. The patient himself is not an active participant in the exam, has gross evidence for psychosis.  He does however, have no gross evidence for other physiologic issues, is hemodynamically unremarkable.  Patient appropriate for behavioral health evaluation.  Final Clinical Impression(s) / ED Diagnoses Final diagnoses:  Acute psychosis (HCC)      Gerhard Munch, MD 11/24/19 2338

## 2019-11-24 NOTE — Tx Team (Cosign Needed)
Interdisciplinary Treatment and Diagnostic Plan Update  11/24/2019 Time of Session: 1012AM RACHID PARHAM MRN: [id number]  Principal Diagnosis: <principal problem not specified>  Secondary Diagnoses: Active Problems:   Schizophrenia ([place])   Current Medications:  Current Facility-Administered Medications  Medication Dose Route Frequency Provider Last Rate Last Admin  . acetaminophen (TYLENOL) tablet 650 mg  650 mg Oral Q4H PRN Mordecai Maes, NP      . alum & mag hydroxide-simeth (MAALOX/MYLANTA) 200-200-20 MG/5ML suspension 30 mL  30 mL Oral Q6H PRN Mordecai Maes, NP      . ARIPiprazole (ABILIFY) tablet 20 mg  20 mg Oral Daily Mordecai Maes, NP   20 mg at 11/24/19 0814  . benztropine (COGENTIN) tablet 0.5 mg  0.5 mg Oral BID Mordecai Maes, NP   0.5 mg at 11/24/19 0814  . buPROPion [facility] SR) 12 hr tablet 150 mg  150 mg Oral BID Mordecai Maes, NP   150 mg at 11/24/19 0814  . famotidine (PEPCID) tablet 20 mg  20 mg Oral Daily Mordecai Maes, NP   20 mg at 11/24/19 0814  . lithium carbonate (ESKALITH) CR tablet 450 mg  450 mg Oral BID Mordecai Maes, NP   450 mg at 11/24/19 0814  . metFORMIN (GLUCOPHAGE) tablet 500 mg  500 mg Oral BID WC Mordecai Maes, NP   500 mg at 11/24/19 0814  . nicotine (NICODERM CQ - dosed in mg/24 hours) patch 14 mg  14 mg Transdermal Daily Mordecai Maes, NP      . ondansetron [facility]) tablet 4 mg  4 mg Oral Q8H PRN Mordecai Maes, NP      . zolpidem (AMBIEN) tablet 5 mg  5 mg Oral QHS PRN Mordecai Maes, NP   5 mg at 11/23/19 2034   PTA Medications: Medications Prior to Admission  Medication Sig Dispense Refill Last Dose  . ARIPiprazole (ABILIFY) 20 MG tablet Take 20 mg by mouth daily.     . benztropine (COGENTIN) 0.5 MG tablet Take 0.5 mg by mouth 2 (two) times daily.     Marland Kitchen buPROPion (ZYBAN) 150 MG 12 hr tablet Take 150 mg by mouth 2 (two) times daily.     . famotidine (PEPCID) 20 MG tablet Take 1 tablet (20 mg total) by  mouth daily. (Patient not taking: Reported on 11/21/2019) 30 tablet 3   . lithium carbonate (ESKALITH) 450 MG CR tablet Take 450 mg by mouth 2 (two) times daily.     . metFORMIN (GLUCOPHAGE) 500 MG tablet TAKE 1 TABLET BY MOUTH TWICE DAILY WITH MEALS (Patient taking differently: Take 500 mg by mouth 2 (two) times daily with a meal. ) 180 tablet 0     Patient Stressors: Marital or family conflict Medication change or noncompliance  Patient Strengths: Average or above average intelligence Supportive family/friends  Treatment Modalities: Medication Management, Group therapy, Case management,  1 to 1 session with clinician, Psychoeducation, Recreational therapy.   Physician Treatment Plan for Primary Diagnosis: <principal problem not specified> Long Term Goal(s): Improvement in symptoms so as ready for discharge   Short Term Goals: Ability to identify changes in lifestyle to reduce recurrence of condition will improve Ability to verbalize feelings will improve Ability to demonstrate self-control will improve Ability to identify and develop effective coping behaviors will improve Ability to maintain clinical measurements within normal limits will improve Compliance with prescribed medications will improve  Medication Management: Evaluate patient's response, side effects, and tolerance of medication regimen.  Therapeutic Interventions: 1 to 1 sessions, Unit Group  sessions and Medication administration.  Evaluation of Outcomes: Not Met  Physician Treatment Plan for Secondary Diagnosis: Active Problems:   Schizophrenia ([place])  Long Term Goal(s): Improvement in symptoms so as ready for discharge   Short Term Goals: Ability to identify changes in lifestyle to reduce recurrence of condition will improve Ability to verbalize feelings will improve Ability to demonstrate self-control will improve Ability to identify and develop effective coping behaviors will improve Ability to maintain  clinical measurements within normal limits will improve Compliance with prescribed medications will improve     Medication Management: Evaluate patient's response, side effects, and tolerance of medication regimen.  Therapeutic Interventions: 1 to 1 sessions, Unit Group sessions and Medication administration.  Evaluation of Outcomes: Not Met   RN Treatment Plan for Primary Diagnosis: <principal problem not specified> Long Term Goal(s): Knowledge of disease and therapeutic regimen to maintain health will improve  Short Term Goals: Ability to participate in decision making will improve, Ability to verbalize feelings will improve and Compliance with prescribed medications will improve  Medication Management: RN will administer medications as ordered by provider, will assess and evaluate patient's response and provide education to patient for prescribed medication. RN will report any adverse and/or side effects to prescribing provider.  Therapeutic Interventions: 1 on 1 counseling sessions, Psychoeducation, Medication administration, Evaluate responses to treatment, Monitor vital signs and CBGs as ordered, Perform/monitor CIWA, COWS, AIMS and Fall Risk screenings as ordered, Perform wound care treatments as ordered.  Evaluation of Outcomes: Not Met   LCSW Treatment Plan for Primary Diagnosis: <principal problem not specified> Long Term Goal(s): Safe transition to appropriate next level of care at discharge, Engage patient in therapeutic group addressing interpersonal concerns.  Short Term Goals: Engage patient in aftercare planning with referrals and resources, Increase emotional regulation, Facilitate acceptance of mental health diagnosis and concerns and Increase skills for wellness and recovery  Therapeutic Interventions: Assess for all discharge needs, 1 to 1 time with Social worker, Explore available resources and support systems, Assess for adequacy in community support network, Educate  family and significant other(s) on suicide prevention, Complete Psychosocial Assessment, Interpersonal group therapy.  Evaluation of Outcomes: Not Met   Progress in Treatment: Attending groups: No. Participating in groups: No. Taking medication as prescribed: Yes. Toleration medication: Yes. Family/Significant other contact made: No, will contact:  mother, who is also Pt's guardian. Patient understands diagnosis: No. Discussing patient identified problems/goals with staff: Yes. Medical problems stabilized or resolved: No. Denies suicidal/homicidal ideation: Yes. Issues/concerns per patient self-inventory: No. Other: None  New problem(s) identified: Yes, Describe:  Pt admitted for mood instability. Pt does lack some insight regarding reason for admission.  New Short Term/Long Term Goal(s): SW will contact Pt's mother / guardian for continuity of care. Pt will be encouraged to attend groups. SW will ensure Pt has appropriate out-patient services arranged prior to discharge.  Patient Goals:  "I don't have a goal"  Discharge Plan or Barriers: SW will continue to assess.  Reason for Continuation of Hospitalization: Anxiety Medication stabilization  Estimated Length of Stay: 3-5 Days  Attendees: Patient: James [name] 11/24/2019 2:23 PM  Physician: Theron Arista, NP 11/24/2019 2:23 PM  Nursing:  11/24/2019 2:23 PM  RN Care Manager: 11/24/2019 2:23 PM  Social Worker: Freddi Che, LCSW 11/24/2019 2:23 PM  Recreational Therapist:  11/24/2019 2:23 PM  Other:  11/24/2019 2:23 PM  Other:  11/24/2019 2:23 PM  Other: 11/24/2019 2:23 PM    Scribe for Treatment Team: Freddi Che, LCSW 11/24/2019  2:23 PM

## 2019-11-24 NOTE — Progress Notes (Signed)
   11/24/19 1600  Psych Admission Type (Psych Patients Only)  Admission Status Involuntary  Psychosocial Assessment  Patient Complaints None  Eye Contact Brief  Facial Expression Anxious  Affect Anxious  Speech Pressured;Rapid  Interaction Guarded  Motor Activity Restless;Pacing  Appearance/Hygiene Disheveled  Behavior Characteristics Cooperative  Mood Preoccupied  Thought Process  Coherency Concrete thinking;Disorganized  Content WDL  Delusions None reported or observed  Perception Hallucinations  Hallucination None reported or observed  Judgment Impaired  Confusion UTA  Danger to Self  Current suicidal ideation? Denies  Danger to Others  Danger to Others None reported or observed

## 2019-11-24 NOTE — Progress Notes (Signed)
Pt continues to pace. Pt has come out of his room at least 3 times over the course of the night. Pt redirectable. Asked to pace in his room. Will continue to monitor.

## 2019-11-25 MED ORDER — TRAZODONE HCL 100 MG PO TABS
100.0000 mg | ORAL_TABLET | Freq: Every evening | ORAL | Status: DC | PRN
  Administered 2019-11-27 – 2019-11-29 (×2): 100 mg via ORAL
  Filled 2019-11-25 (×5): qty 1

## 2019-11-25 NOTE — Progress Notes (Signed)
Psychoeducational Group Note  Date:  11/25/2019 Time:  2021  Group Topic/Focus:  Wrap-Up Group:   The focus of this group is to help patients review their daily goal of treatment and discuss progress on daily workbooks.  Participation Level: Did Not Attend  Participation Quality:  Not Applicable  Affect:  Not Applicable  Cognitive:  Not Applicable  Insight:  Not Applicable  Engagement in Group: Not Applicable  Additional Comments:  The patient did not attend group this evening.   Hazle Coca S 11/25/2019, 9:21 PM

## 2019-11-25 NOTE — Progress Notes (Signed)
Contacted provider regarding Ambien 5mg . Ambien discontinued. Trazodone 100mg  PRN for sleep started.

## 2019-11-25 NOTE — Progress Notes (Signed)
[facility] MD Progress Note  11/25/2019 11:59 AM James [name]  MRN:  [id number]     Daily note: Patient seen, chart reviewed, case discussed with treatment team.  Patient continues to pace the halls and attend minimal groups.  He is cooperative upon approach although remains guarded about his mood and feelings.  He continues to deny auditory hallucinations when asked.  Patient also denies any suicidal ideation or homicidal ideation.  He is compliant with medications, and eating his meals without issue.  Sleep again is poor had approximately 4 hours. no side effects of the medication reported or observed.    Principal Problem: <principal problem not specified> Diagnosis: Active Problems:   Schizophrenia (HCC)  Total Time spent with patient: 20 minutes  Past Psychiatric History: See HPI  Past Medical History:  Past Medical History:  Diagnosis Date  . Gastroesophageal reflux disease 11/25/2017   History reviewed. No pertinent surgical history. Family History:  Family History  Problem Relation Age of Onset  . Diabetes Mother   . Diabetes Father   . Prostate cancer Father    Family Psychiatric  History: See HPI Social History:  Social History   Substance and Sexual Activity  Alcohol Use No  . Alcohol/week: 0.0 standard drinks   Comment: rare alcohol     Social History   Substance and Sexual Activity  Drug Use No    Social History   Socioeconomic History  . Marital status: Single    Spouse name: Not on file  . Number of children: Not on file  . Years of education: Not on file  . Highest education level: Not on file  Occupational History  . Not on file  Tobacco Use  . Smoking status: Current Every Day Smoker    Packs/day: 0.50    Types: Cigarettes  . Smokeless tobacco: Never Used  Substance and Sexual Activity  . Alcohol use: No    Alcohol/week: 0.0 standard drinks    Comment: rare alcohol  . Drug use: No  . Sexual activity: Yes    Comment: male  Other  Topics Concern  . Not on file  Social History Narrative  . Not on file   Social Determinants of Health   Financial Resource Strain:   . Difficulty of Paying Living Expenses: Not on file  Food Insecurity:   . Worried About Programme researcher, broadcasting/film/video in the Last Year: Not on file  . Ran Out of Food in the Last Year: Not on file  Transportation Needs:   . Lack of Transportation (Medical): Not on file  . Lack of Transportation (Non-Medical): Not on file  Physical Activity:   . Days of Exercise per Week: Not on file  . Minutes of Exercise per Session: Not on file  Stress:   . Feeling of Stress : Not on file  Social Connections:   . Frequency of Communication with Friends and Family: Not on file  . Frequency of Social Gatherings with Friends and Family: Not on file  . Attends Religious Services: Not on file  . Active Member of Clubs or Organizations: Not on file  . Attends Banker Meetings: Not on file  . Marital Status: Not on file   Additional Social History:                         Sleep: Poor  Appetite:  Good  Current Medications: Current Facility-Administered Medications  Medication Dose Route Frequency Provider Last Rate  Last Admin  . acetaminophen (TYLENOL) tablet 650 mg  650 mg Oral Q4H PRN Denzil Magnuson, NP      . alum & mag hydroxide-simeth (MAALOX/MYLANTA) 200-200-20 MG/5ML suspension 30 mL  30 mL Oral Q6H PRN Denzil Magnuson, NP      . ARIPiprazole (ABILIFY) tablet 20 mg  20 mg Oral Daily Denzil Magnuson, NP   20 mg at 11/25/19 1000  . benztropine (COGENTIN) tablet 0.5 mg  0.5 mg Oral BID Denzil Magnuson, NP   0.5 mg at 11/25/19 1000  . buPROPion (WELLBUTRIN SR) 12 hr tablet 150 mg  150 mg Oral BID Denzil Magnuson, NP   150 mg at 11/25/19 1000  . famotidine (PEPCID) tablet 20 mg  20 mg Oral Daily Denzil Magnuson, NP   20 mg at 11/25/19 1000  . lithium carbonate (ESKALITH) CR tablet 450 mg  450 mg Oral BID Denzil Magnuson, NP   450 mg at  11/25/19 1000  . metFORMIN (GLUCOPHAGE) tablet 500 mg  500 mg Oral BID WC Denzil Magnuson, NP   500 mg at 11/25/19 1000  . nicotine (NICODERM CQ - dosed in mg/24 hours) patch 14 mg  14 mg Transdermal Daily Denzil Magnuson, NP      . ondansetron [facility]) tablet 4 mg  4 mg Oral Q8H PRN Denzil Magnuson, NP      . zolpidem (AMBIEN) tablet 5 mg  5 mg Oral QHS PRN Denzil Magnuson, NP   5 mg at 11/23/19 2034    Lab Results:  No results found for this or any previous visit (from the past 48 hour(s)).  Blood Alcohol level:  Lab Results  Component Value Date   ETH <10 11/20/2019   ETH <10 06/27/2018    Metabolic Disorder Labs: Lab Results  Component Value Date   HGBA1C 5.8 (H) 11/23/2019   MPG 120 11/23/2019   MPG 111.15 06/27/2018   Lab Results  Component Value Date   PROLACTIN 3.3 (L) 11/23/2019   Lab Results  Component Value Date   CHOL 153 11/23/2019   TRIG 46 11/23/2019   HDL 58 11/23/2019   CHOLHDL 2.6 11/23/2019   VLDL 9 11/23/2019   LDLCALC 86 11/23/2019   LDLCALC 72 06/27/2018    Physical Findings: AIMS: Facial and Oral Movements Muscles of Facial Expression: None, normal Lips and Perioral Area: None, normal Jaw: None, normal Tongue: None, normal,Extremity Movements Upper (arms, wrists, hands, fingers): None, normal Lower (legs, knees, ankles, toes): None, normal, Trunk Movements Neck, shoulders, hips: None, normal, Overall Severity Severity of abnormal movements (highest score from questions above): None, normal Incapacitation due to abnormal movements: None, normal Patient's awareness of abnormal movements (rate only patient's report): No Awareness, Dental Status Current problems with teeth and/or dentures?: No Does patient usually wear dentures?: No  CIWA:    COWS:     Musculoskeletal: Strength & Muscle Tone: within normal limits Gait & Station: normal Patient leans: N/A  Psychiatric Specialty Exam: Physical Exam   Review of Systems  All other  systems reviewed and are negative.   Blood pressure (!) 124/107, pulse 73, temperature 97.7 F (36.5 C), temperature source Oral, resp. rate 18, height 5\' 11"  (1.803 m), weight 72.1 kg, SpO2 100 %.Body mass index is 22.18 kg/m.  General Appearance: Disheveled  Eye Contact:  Good  Speech:  Slow  Volume:  Normal  Mood:  "OK"  Affect:  Constricted and Flat  Thought Process:  Goal Directed  Orientation:  Full (Time, Place, and Person)  Thought Content:  Logical  Suicidal Thoughts:  No  Homicidal Thoughts:  No  Memory:  Recent;   Fair  Judgement:  Impaired  Insight:  Present  Psychomotor Activity:  Increased and Restlessness  Concentration:  Concentration: Fair  Recall:  [employer] of Knowledge:  Fair  Language:  Fair  Akathisia:  No  Handed:  Right  AIMS (if indicated):     Assets:  Desire for Improvement Leisure Time Physical Health Social Support  ADL's:  Impaired  Cognition:  WNL  Sleep:  Number of Hours: 4.25     Treatment Plan Summary: Daily contact with patient to assess and evaluate symptoms and progress in treatment and Medication management   Assessment: Patient continues to display bizarre and psychotic behavior like internal preoccupation and his constant pacing.  He will benefit from continued inpatient stay for safety, stabilization, medication management.  Patient has been in good behavioral control despite this and is compliant with the medication.  There is some concern that patient may be experiencing akathisia.  Patient has preferred to stay on his home regimen as opposed to switching agents.  We will continue to monitor patient on this regimen to ensure symptom resolution.  Ideally, patient will be able to be stabilized with just reinstatement of his home regimen, but if need be, we will consider other second-generation antipsychotics to alleviate symptoms.  Today, patient seems to be making some progress although slow.  Plan: Continue current medication  regimen Abilify 20 mg p.o. every morning Bupropion 150 twice daily Lithium 450 twice daily Ambien 5 mg p.o. nightly  Clement Sayres, MD 11/25/2019, 11:59 AM

## 2019-11-25 NOTE — [facility] Suicide Risk Assessment (Signed)
[facility] INPATIENT:  Family/Significant Other Suicide Prevention Education  Suicide Prevention Education:  Contact Attempts: Mother, Esias Mory [phone]), has been identified by the patient as the family member/significant other with whom the patient will be residing, and identified as the person(s) who will aid the patient in the event of a mental health crisis.  With written consent from the patient, two attempts were made to provide suicide prevention education, prior to and/or following the patient's discharge.  We were unsuccessful in providing suicide prevention education.  A suicide education pamphlet was given to the patient to share with family/significant other.   Date and time of first attempt: 11/25/19 at 3:15pm CSW left a HIPAA compliant voicemail for Ms. Shirk.  Date and time of second attempt: Second attempt still needs to be made.    Ruthann Cancer MSW, LCSW Clincal Social Worker  [facility]

## 2019-11-25 NOTE — Progress Notes (Signed)
   11/25/19 1000  Psych Admission Type (Psych Patients Only)  Admission Status Involuntary  Psychosocial Assessment  Patient Complaints Anxiety  Eye Contact Brief  Facial Expression Anxious  Affect Anxious  Speech Pressured;Rapid  Interaction Guarded  Motor Activity Restless;Pacing  Appearance/Hygiene Disheveled  Behavior Characteristics Cooperative;Restless  Mood Preoccupied  Thought Process  Coherency Concrete thinking;Disorganized  Content WDL  Delusions None reported or observed  Perception Hallucinations  Hallucination None reported or observed  Judgment Impaired  Confusion None  Danger to Self  Current suicidal ideation? Denies  Danger to Others  Danger to Others None reported or observed

## 2019-11-25 NOTE — [facility] Group Notes (Signed)
Did not attend any groups today.  

## 2019-11-25 NOTE — Progress Notes (Signed)
   11/25/19 2340  Psych Admission Type (Psych Patients Only)  Admission Status Involuntary  Psychosocial Assessment  Patient Complaints None  Eye Contact Brief  Facial Expression Anxious  Affect Anxious  Speech Pressured;Rapid  Interaction Guarded  Motor Activity Restless;Pacing  Appearance/Hygiene Disheveled  Behavior Characteristics Cooperative;Guarded;Restless  Mood Preoccupied  Thought Process  Coherency Concrete thinking;Disorganized  Content WDL  Delusions None reported or observed  Perception Hallucinations  Hallucination None reported or observed  Judgment Impaired  Confusion None  Danger to Self  Current suicidal ideation? Denies  Danger to Others  Danger to Others None reported or observed   Pt still pacing the halls. Pt also murmuring and talking to himself. Pt asks questions and then walks away before hearing the answers. Pt has not had the newly prescribed Trazodone yet tonight because he is already lying down. Will offer to pt if and when he gets up later tonight.

## 2019-11-26 DIAGNOSIS — F200 Paranoid schizophrenia: Secondary | ICD-10-CM | POA: Diagnosis not present

## 2019-11-26 MED ORDER — BUPROPION HCL ER (SR) 150 MG PO TB12
150.0000 mg | ORAL_TABLET | Freq: Every day | ORAL | Status: DC
  Administered 2019-11-27 – 2019-12-02 (×6): 150 mg via ORAL
  Filled 2019-11-26 (×7): qty 1

## 2019-11-26 MED ORDER — ZOLPIDEM TARTRATE 5 MG PO TABS
5.0000 mg | ORAL_TABLET | Freq: Every day | ORAL | Status: DC
  Administered 2019-11-27 – 2019-11-29 (×3): 5 mg via ORAL
  Filled 2019-11-26 (×5): qty 1

## 2019-11-26 NOTE — [facility] Suicide Risk Assessment (Signed)
[facility] INPATIENT:  Family/Significant Other Suicide Prevention Education  Suicide Prevention Education:  Education Completed; Brynden Thune [phone] (Mother) has been identified by the patient as the family member/significant other with whom the patient will be residing, and identified as the person(s) who will aid the patient in the event of a mental health crisis (suicidal ideations/suicide attempt).  With written consent from the patient, the family member/significant other has been provided the following suicide prevention education, prior to the and/or following the discharge of the patient.  The suicide prevention education provided includes the following:  Suicide risk factors  Suicide prevention and interventions  National Suicide Hotline telephone number  [facility] assessment telephone number  [facility] Emergency Assistance 911  [facility] and/or Residential Mobile Crisis Unit telephone number  Request made of family/significant other to:  Remove weapons (e.g., guns, rifles, knives), all items previously/currently identified as safety concern.    Remove drugs/medications (over-the-counter, prescriptions, illicit drugs), all items previously/currently identified as a safety concern.  The family member/significant other verbalizes understanding of the suicide prevention education information provided.  The family member/significant other agrees to remove the items of safety concern listed above.   Mrs. Charney states that she is the legal guardian of Neale and that she will bring the paperwork to the hospital today (11/26/19) before 5pm.  Mrs. Liew states that Lori has no been this aggressive before and she has taken the steps to put up all the knifes and anything that could be a weapon in the home.  Mrs. Johnson states there are no firearms in the home.  Mrs. Jafri states that she would like Madhav to be on one pill a day or a long acting  injectable to help him be more stable and to remember to take the medication.  Mrs. Washam states that without this she does not want him to come home.  (Mrs. Roark also states that she would like a stop smoking medication that does not taper and can force him to stop smoking.  Mrs. Heo would also like Jacion to have a program to go to during the day and states "he needs something to do because all he does is eat and sleep.  Mrs. Vivona states that Domonick already has an Airline pilot)  CSW completed SPE with Mrs. Wisdom.    Metro Kung Fronia Depass 11/26/2019, 3:27 PM

## 2019-11-26 NOTE — Progress Notes (Signed)
[facility] MD Progress Note  11/26/2019 10:53 AM James [name]  MRN:  [id number]  Principal Problem: <principal problem not specified> Diagnosis: Active Problems:   Schizophrenia (HCC)  Total Time spent with patient: 20 minutes  Daily note: Patient seen, chart reviewed, case discussed with treatment team.  Patient continues to pace the hallways and at times is seen mumbling to himself.  Despite this, he is calm and cooperative upon approach and will continue to deny any acute symptoms including suicidal ideation or auditory hallucinations.  When asked, patient will stay that he is still struggling with his sleep.  Denies trouble with mood.  No side effects of the medication reported or observed.  Past Medical History:  Past Medical History:  Diagnosis Date  . Gastroesophageal reflux disease 11/25/2017   History reviewed. No pertinent surgical history. Family History:  Family History  Problem Relation Age of Onset  . Diabetes Mother   . Diabetes Father   . Prostate cancer Father    Family Psychiatric  History:  Social History:  Social History   Substance and Sexual Activity  Alcohol Use No  . Alcohol/week: 0.0 standard drinks   Comment: rare alcohol     Social History   Substance and Sexual Activity  Drug Use No    Social History   Socioeconomic History  . Marital status: Single    Spouse name: Not on file  . Number of children: Not on file  . Years of education: Not on file  . Highest education level: Not on file  Occupational History  . Not on file  Tobacco Use  . Smoking status: Current Every Day Smoker    Packs/day: 0.50    Types: Cigarettes  . Smokeless tobacco: Never Used  Substance and Sexual Activity  . Alcohol use: No    Alcohol/week: 0.0 standard drinks    Comment: rare alcohol  . Drug use: No  . Sexual activity: Yes    Comment: male  Other Topics Concern  . Not on file  Social History Narrative  . Not on file   Social Determinants of Health    Financial Resource Strain:   . Difficulty of Paying Living Expenses: Not on file  Food Insecurity:   . Worried About Programme researcher, broadcasting/film/video in the Last Year: Not on file  . Ran Out of Food in the Last Year: Not on file  Transportation Needs:   . Lack of Transportation (Medical): Not on file  . Lack of Transportation (Non-Medical): Not on file  Physical Activity:   . Days of Exercise per Week: Not on file  . Minutes of Exercise per Session: Not on file  Stress:   . Feeling of Stress : Not on file  Social Connections:   . Frequency of Communication with Friends and Family: Not on file  . Frequency of Social Gatherings with Friends and Family: Not on file  . Attends Religious Services: Not on file  . Active Member of Clubs or Organizations: Not on file  . Attends Banker Meetings: Not on file  . Marital Status: Not on file   Additional Social History:                         Sleep: Poor  Appetite:  Good  Current Medications: Current Facility-Administered Medications  Medication Dose Route Frequency Provider Last Rate Last Admin  . acetaminophen (TYLENOL) tablet 650 mg  650 mg Oral Q4H PRN Denzil Magnuson, NP      .  alum & mag hydroxide-simeth (MAALOX/MYLANTA) 200-200-20 MG/5ML suspension 30 mL  30 mL Oral Q6H PRN Denzil Magnuson, NP      . ARIPiprazole (ABILIFY) tablet 20 mg  20 mg Oral Daily Denzil Magnuson, NP   20 mg at 11/26/19 0109  . benztropine (COGENTIN) tablet 0.5 mg  0.5 mg Oral BID Denzil Magnuson, NP   0.5 mg at 11/26/19 3235  . [START ON 11/27/2019] buPROPion (WELLBUTRIN SR) 12 hr tablet 150 mg  150 mg Oral Daily Jamair Cato A, MD      . famotidine (PEPCID) tablet 20 mg  20 mg Oral Daily Denzil Magnuson, NP   20 mg at 11/26/19 5732  . lithium carbonate (ESKALITH) CR tablet 450 mg  450 mg Oral BID Denzil Magnuson, NP   450 mg at 11/26/19 2025  . metFORMIN (GLUCOPHAGE) tablet 500 mg  500 mg Oral BID WC Denzil Magnuson, NP   500 mg at  11/26/19 4270  . nicotine (NICODERM CQ - dosed in mg/24 hours) patch 14 mg  14 mg Transdermal Daily Denzil Magnuson, NP      . ondansetron [facility]) tablet 4 mg  4 mg Oral Q8H PRN Denzil Magnuson, NP      . traZODone (DESYREL) tablet 100 mg  100 mg Oral QHS PRN Jackelyn Poling, NP        Lab Results: No results found for this or any previous visit (from the past 48 hour(s)).  Blood Alcohol level:  Lab Results  Component Value Date   ETH <10 11/20/2019   ETH <10 06/27/2018    Metabolic Disorder Labs: Lab Results  Component Value Date   HGBA1C 5.8 (H) 11/23/2019   MPG 120 11/23/2019   MPG 111.15 06/27/2018   Lab Results  Component Value Date   PROLACTIN 3.3 (L) 11/23/2019   Lab Results  Component Value Date   CHOL 153 11/23/2019   TRIG 46 11/23/2019   HDL 58 11/23/2019   CHOLHDL 2.6 11/23/2019   VLDL 9 11/23/2019   LDLCALC 86 11/23/2019   LDLCALC 72 06/27/2018    Physical Findings: AIMS: Facial and Oral Movements Muscles of Facial Expression: None, normal Lips and Perioral Area: None, normal Jaw: None, normal Tongue: None, normal,Extremity Movements Upper (arms, wrists, hands, fingers): None, normal Lower (legs, knees, ankles, toes): None, normal, Trunk Movements Neck, shoulders, hips: None, normal, Overall Severity Severity of abnormal movements (highest score from questions above): None, normal Incapacitation due to abnormal movements: None, normal Patient's awareness of abnormal movements (rate only patient's report): No Awareness, Dental Status Current problems with teeth and/or dentures?: No Does patient usually wear dentures?: No  CIWA:    COWS:      Musculoskeletal: Strength & Muscle Tone: within normal limits Gait & Station: normal Patient leans: N/A  Psychiatric Specialty Exam: Physical Exam   Review of Systems  All other systems reviewed and are negative.   Blood pressure (!) 124/107, pulse 73, temperature 97.7 F (36.5 C), temperature source  Oral, resp. rate 18, height 5\' 11"  (1.803 m), weight 72.1 kg, SpO2 100 %.Body mass index is 22.18 kg/m.  General Appearance: Disheveled  Eye Contact:  Good  Speech:  Slow  Volume:  Normal  Mood:  "OK"  Affect:  Constricted and Flat  Thought Process:  Goal Directed  Orientation:  Full (Time, Place, and Person)  Thought Content:  Logical  Suicidal Thoughts:  No  Homicidal Thoughts:  No  Memory:  Recent;   Fair  Judgement:  Impaired  Insight:  Present  Psychomotor Activity:  Increased and Restlessness  Concentration:  Concentration: Fair  Recall:  [employer] of Knowledge:  Fair  Language:  Fair  Akathisia:  No  Handed:  Right  AIMS (if indicated):     Assets:  Desire for Improvement Leisure Time Physical Health Social Support  ADL's:  Impaired  Cognition:  WNL  Sleep:  poor    Treatment Plan Summary: Daily contact with patient to assess and evaluate symptoms and progress in treatment and Medication management   Assessment: Patient continues to display bizarre and psychotic behavior like internal preoccupation and his constant pacing.  He will benefit from continued inpatient stay for safety, stabilization, medication management.  Patient has been in good behavioral control despite this and is compliant with the medication.  There is some concern that patient may be experiencing akathisia.  Patient has preferred to stay on his home regimen as opposed to switching agents.  We will continue to monitor patient on this regimen to ensure symptom resolution.  Ideally, patient will be able to be stabilized with just reinstatement of his home regimen, but if need be, we will consider other second-generation antipsychotics to alleviate symptoms.  Today, patient seems to be making some progress although slow.  We will discontinue his evening dose of bupropion and plan to draw a lithium level tomorrow.  Plan: Continue current medication regimen Abilify 20 mg p.o. every morning Bupropion 150  twice daily Lithium 450 twice daily Ambien 5 mg p.o. nightly  Lithium level tomorrow   Clement Sayres, MD 11/26/2019, 10:53 AM

## 2019-11-26 NOTE — Progress Notes (Signed)
Pt awake and out of his room. Pt redirected to pace in his room. Pt refused sleep medication. Will continue to monitor.

## 2019-11-26 NOTE — [facility] Group Notes (Addendum)
[facility] LCSW Group Therapy  11/26/2019 1:30 PM  Type of Therapy:  Self-Care  Participation Level:  Did Not Attend   Summary of Progress/Problems: This patient was invited to attend group, however this patient chose not to attend.    Jacinta Shoe, LCSW 11/26/2019, 1:48PM

## 2019-11-26 NOTE — Progress Notes (Signed)
   11/26/19 1000  Psych Admission Type (Psych Patients Only)  Admission Status Involuntary  Psychosocial Assessment  Patient Complaints Restlessness  Eye Contact Brief  Facial Expression Anxious  Affect Anxious  Speech Pressured;Rapid  Interaction Guarded  Motor Activity Restless;Pacing  Appearance/Hygiene Disheveled  Behavior Characteristics Cooperative  Mood Preoccupied  Thought Process  Coherency Concrete thinking;Disorganized  Content WDL  Delusions None reported or observed  Perception Hallucinations  Hallucination None reported or observed  Judgment Impaired  Confusion None  Danger to Self  Current suicidal ideation? Denies  Danger to Others  Danger to Others None reported or observed

## 2019-11-26 NOTE — Progress Notes (Signed)
Pt continues to pace the unit with disorganized thinking. Pt responds to internal stimuli and keeps to himself on the unit, pt intrusive at times     11/26/19 2100  Psych Admission Type (Psych Patients Only)  Admission Status Involuntary  Psychosocial Assessment  Patient Complaints Suspiciousness;Hyperactivity;Restlessness  Eye Contact Brief  Facial Expression Anxious  Affect Anxious  Speech Pressured;Rapid  Interaction Guarded  Motor Activity Restless;Pacing  Appearance/Hygiene Disheveled  Behavior Characteristics Cooperative  Mood Preoccupied  Thought Process  Coherency Concrete thinking;Disorganized  Content WDL  Delusions None reported or observed  Perception Hallucinations  Hallucination None reported or observed  Judgment Impaired  Confusion None  Danger to Self  Current suicidal ideation? Denies  Danger to Others  Danger to Others None reported or observed

## 2019-11-27 DIAGNOSIS — F200 Paranoid schizophrenia: Secondary | ICD-10-CM | POA: Diagnosis not present

## 2019-11-27 LAB — LITHIUM LEVEL: Lithium Lvl: 0.56 mmol/L — ABNORMAL LOW (ref 0.60–1.20)

## 2019-11-27 MED ORDER — OLANZAPINE 10 MG PO TABS
10.0000 mg | ORAL_TABLET | Freq: Every day | ORAL | Status: DC
  Administered 2019-11-27: 10 mg via ORAL
  Filled 2019-11-27 (×2): qty 1

## 2019-11-27 MED ORDER — OLANZAPINE 2.5 MG PO TABS
2.5000 mg | ORAL_TABLET | ORAL | Status: AC
  Administered 2019-11-27: 2.5 mg via ORAL
  Filled 2019-11-27 (×2): qty 1

## 2019-11-27 NOTE — Progress Notes (Signed)
Patient rates depression 9/10 (10 being worst), anxiety 7/10, and hopelessness 8/10. He denies SI/HI/AVH. Although he denies AVH, he appears to be responding to internal stimuli AEB pacing the hallway and talking to himself. He appears restless and paces the hallway nonstop. He reports good sleep last night.   Orders reviewed. Vital signs reviewed. Verbal support provided. 15 minute checks performed for safety.   Patient compliant with taking medications and denies any medication side effects.

## 2019-11-27 NOTE — Progress Notes (Signed)
[facility] MD Progress Note  11/27/2019 11:12 AM EDI GORNIAK  MRN:  [id number] Subjective: Patient is a 39 year old male with a past psychiatric history significant for schizophrenia who presented on 11/23/2019 secondary to noncompliance of medications.  He apparently had been getting into fights with his father, talking to himself and laughing to himself and appearing to respond to internal stimuli.  Objective: Patient is seen and examined.  Patient is a 39 year old male with the above-stated past psychiatric history who is seen in follow-up.  Review of the nursing notes revealed that the patient rated his depression a 9 out of 10 with 10 being the worst, anxiety as 7 out of 10, and hopelessness and 8 out of 10.  His current medications include Abilify 20 mg p.o. daily, Cogentin 0.5 mg p.o. twice daily, Wellbutrin sustained release 150 mg p.o. daily, lithium carbonate CR 450 mg p.o. twice daily, Metformin 500 mg p.o. twice daily, trazodone 100 mg p.o. nightly as needed.  He also has Ambien 5 mg p.o. nightly.  His vital signs are stable, he is afebrile.  He slept 7.5 hours last night.  Review of the electronic medical record also revealed that he had been previously treated with olanzapine 15 mg p.o. nightly.  Patient denied the auditory hallucinations, but was quite clearly responding to internal stimuli.  He stated the reason why he was noncompliant with his medications was because "my father did not give him to me".  His hygiene is poor, and has terrible body odor today.  Review of his admission laboratories revealed essentially normal electrolytes, normal lipid panel, normal CBC, his lithium level was only 0.56.  Hemoglobin A1c was 5.8.  TSH was normal at 0.562.  Drug screen was negative.  EKG is not been obtained.  Principal Problem: <principal problem not specified> Diagnosis: Active Problems:   Schizophrenia (HCC)  Total Time spent with patient: 20 minutes  Past Psychiatric History: See admission  H&P  Past Medical History:  Past Medical History:  Diagnosis Date  . Gastroesophageal reflux disease 11/25/2017   History reviewed. No pertinent surgical history. Family History:  Family History  Problem Relation Age of Onset  . Diabetes Mother   . Diabetes Father   . Prostate cancer Father    Family Psychiatric  History: See admission H&P Social History:  Social History   Substance and Sexual Activity  Alcohol Use No  . Alcohol/week: 0.0 standard drinks   Comment: rare alcohol     Social History   Substance and Sexual Activity  Drug Use No    Social History   Socioeconomic History  . Marital status: Single    Spouse name: Not on file  . Number of children: Not on file  . Years of education: Not on file  . Highest education level: Not on file  Occupational History  . Not on file  Tobacco Use  . Smoking status: Current Every Day Smoker    Packs/day: 0.50    Types: Cigarettes  . Smokeless tobacco: Never Used  Substance and Sexual Activity  . Alcohol use: No    Alcohol/week: 0.0 standard drinks    Comment: rare alcohol  . Drug use: No  . Sexual activity: Yes    Comment: male  Other Topics Concern  . Not on file  Social History Narrative  . Not on file   Social Determinants of Health   Financial Resource Strain:   . Difficulty of Paying Living Expenses: Not on file  Food Insecurity:   .  Worried About Programme researcher, broadcasting/film/video in the Last Year: Not on file  . Ran Out of Food in the Last Year: Not on file  Transportation Needs:   . Lack of Transportation (Medical): Not on file  . Lack of Transportation (Non-Medical): Not on file  Physical Activity:   . Days of Exercise per Week: Not on file  . Minutes of Exercise per Session: Not on file  Stress:   . Feeling of Stress : Not on file  Social Connections:   . Frequency of Communication with Friends and Family: Not on file  . Frequency of Social Gatherings with Friends and Family: Not on file  . Attends  Religious Services: Not on file  . Active Member of Clubs or Organizations: Not on file  . Attends Banker Meetings: Not on file  . Marital Status: Not on file   Additional Social History:                         Sleep: Good  Appetite:  Fair  Current Medications: Current Facility-Administered Medications  Medication Dose Route Frequency Provider Last Rate Last Admin  . acetaminophen (TYLENOL) tablet 650 mg  650 mg Oral Q4H PRN Denzil Magnuson, NP      . alum & mag hydroxide-simeth (MAALOX/MYLANTA) 200-200-20 MG/5ML suspension 30 mL  30 mL Oral Q6H PRN Denzil Magnuson, NP      . ARIPiprazole (ABILIFY) tablet 20 mg  20 mg Oral Daily Denzil Magnuson, NP   20 mg at 11/27/19 0754  . benztropine (COGENTIN) tablet 0.5 mg  0.5 mg Oral BID Denzil Magnuson, NP   0.5 mg at 11/27/19 0754  . buPROPion (WELLBUTRIN SR) 12 hr tablet 150 mg  150 mg Oral Daily Cristofano, Worthy Rancher, MD   150 mg at 11/27/19 0754  . famotidine (PEPCID) tablet 20 mg  20 mg Oral Daily Denzil Magnuson, NP   20 mg at 11/27/19 0754  . lithium carbonate (ESKALITH) CR tablet 450 mg  450 mg Oral BID Denzil Magnuson, NP   450 mg at 11/27/19 0754  . metFORMIN (GLUCOPHAGE) tablet 500 mg  500 mg Oral BID WC Denzil Magnuson, NP   500 mg at 11/27/19 0754  . nicotine (NICODERM CQ - dosed in mg/24 hours) patch 14 mg  14 mg Transdermal Daily Denzil Magnuson, NP      . ondansetron [facility]) tablet 4 mg  4 mg Oral Q8H PRN Denzil Magnuson, NP      . traZODone (DESYREL) tablet 100 mg  100 mg Oral QHS PRN Nira Conn A, NP   100 mg at 11/27/19 0036  . zolpidem (AMBIEN) tablet 5 mg  5 mg Oral QHS Cristofano, Worthy Rancher, MD   5 mg at 11/27/19 0036    Lab Results:  Results for orders placed or performed during the hospital encounter of 11/22/19 (from the past 48 hour(s))  Lithium level     Status: Abnormal   Collection Time: 11/27/19  6:37 AM  Result Value Ref Range   Lithium Lvl 0.56 (L) 0.60 - 1.20 mmol/L     Comment: Performed at [facility], [address], [place], [place] [address]    Blood Alcohol level:  Lab Results  Component Value Date   [facility] <10 11/20/2019   ETH <10 06/27/2018    Metabolic Disorder Labs: Lab Results  Component Value Date   HGBA1C 5.8 (H) 11/23/2019   MPG 120 11/23/2019   MPG 111.15  06/27/2018   Lab Results  Component Value Date   PROLACTIN 3.3 (L) 11/23/2019   Lab Results  Component Value Date   CHOL 153 11/23/2019   TRIG 46 11/23/2019   HDL 58 11/23/2019   CHOLHDL 2.6 11/23/2019   VLDL 9 11/23/2019   LDLCALC 86 11/23/2019   LDLCALC 72 06/27/2018    Physical Findings: AIMS: Facial and Oral Movements Muscles of Facial Expression: None, normal Lips and Perioral Area: None, normal Jaw: None, normal Tongue: None, normal,Extremity Movements Upper (arms, wrists, hands, fingers): None, normal Lower (legs, knees, ankles, toes): None, normal, Trunk Movements Neck, shoulders, hips: None, normal, Overall Severity Severity of abnormal movements (highest score from questions above): None, normal Incapacitation due to abnormal movements: None, normal Patient's awareness of abnormal movements (rate only patient's report): No Awareness, Dental Status Current problems with teeth and/or dentures?: No Does patient usually wear dentures?: No  CIWA:    COWS:     Musculoskeletal: Strength & Muscle Tone: within normal limits Gait & Station: normal Patient leans: N/A  Psychiatric Specialty Exam: Physical Exam Vitals and nursing note reviewed.  HENT:     Head: Normocephalic and atraumatic.  Pulmonary:     Effort: Pulmonary effort is normal.  Neurological:     General: No focal deficit present.     Mental Status: He is alert.     Review of Systems  Blood pressure 117/78, pulse 82, temperature (!) 97.5 F (36.4 C), temperature source Oral, resp. rate 18, height 5\' 11"  (1.803 m), weight 72.1 kg, SpO2 95 %.Body mass index is 22.18 kg/m.   General Appearance: Disheveled  Eye Contact:  Minimal  Speech:  Pressured  Volume:  Decreased  Mood:  Anxious and Dysphoric  Affect:  Congruent  Thought Process:  Disorganized and Descriptions of Associations: Loose  Orientation:  Full (Time, Place, and Person)  Thought Content:  Delusions and Hallucinations: Auditory  Suicidal Thoughts:  No  Homicidal Thoughts:  No  Memory:  Immediate;   Poor Recent;   Poor Remote;   Poor  Judgement:  Impaired  Insight:  Lacking  Psychomotor Activity:  Increased  Concentration:  Concentration: Fair and Attention Span: Fair  Recall:  [employer]  Fund of Knowledge:  Fair  Language:  Good  Akathisia:  Negative  Handed:  Right  AIMS (if indicated):     Assets:  Desire for Improvement Housing Resilience  ADL's:  Intact  Cognition:  WNL  Sleep:  Number of Hours: 7.5     Treatment Plan Summary: Daily contact with patient to assess and evaluate symptoms and progress in treatment, Medication management and Plan : Patient is seen and examined.  Patient is a 39 year old male with the above-stated past psychiatric history who is seen in follow-up.   Diagnosis: 1.  Schizophrenia 2.  Diabetes mellitus type 2  Pertinent findings on examination today: 1.  Admitted to auditory hallucinations. 2.  Clearly paranoid. 3.  Poor hygiene.  Plan: 1.  Stop Abilify. 2.  Start olanzapine 10 mg p.o. nightly for psychosis and mood stability.  Titrate this further if necessary. 3.  Continue Cogentin 0.5 mg p.o. twice daily for akathisia. 4.  Continue Wellbutrin SR 150 mg p.o. daily for depression. 5.  Continue Pepcid 20 mg p.o. daily for GERD. 6.  Continue lithium carbonate CR 450 mg p.o. twice daily for mood stability. 7.  Continue Glucophage 500 mg p.o. twice daily with food for diabetes mellitus type 2 8.  Order daily blood sugars. 9.  Order  EKG. 10.  Continue trazodone 100 mg p.o. nightly as needed insomnia. 11.  Continue Ambien 5 mg p.o. nightly for  insomnia. 12.  Disposition planning-in progress.  Antonieta Pert, MD 11/27/2019, 11:12 AM

## 2019-11-27 NOTE — [facility] Group Notes (Signed)
LCSW Group Therapy Note  11/27/2019   10:00-11:00am   Type of Therapy and Topic:  Group Therapy: Anger Cues and Responses  Participation Level:  Did Not Attend   Description of Group:   In this group, patients learned how to recognize the physical, cognitive, emotional, and behavioral responses they have to anger-provoking situations.  They identified a recent time they became angry and how they reacted.  They analyzed how their reaction was possibly beneficial and how it was possibly unhelpful.  The group discussed a variety of healthier coping skills that could help with such a situation in the future.  Focus was placed on how helpful it is to recognize the underlying emotions to our anger, because working on those can lead to a more permanent solution as well as our ability to focus on the important rather than the urgent.  Therapeutic Goals: 1. Patients will remember their last incident of anger and how they felt emotionally and physically, what their thoughts were at the time, and how they behaved. 2. Patients will identify how their behavior at that time worked for them, as well as how it worked against them. 3. Patients will explore possible new behaviors to use in future anger situations. 4. Patients will learn that anger itself is normal and cannot be eliminated, and that healthier reactions can assist with resolving conflict rather than worsening situations.  Summary of Patient Progress:  The patient did not attend.  Therapeutic Modalities:   Cognitive Behavioral Therapy  Wells Gerdeman D Sanel Stemmer    

## 2019-11-27 NOTE — Progress Notes (Signed)
Adult Psychoeducational Group Note  Date:  11/27/2019 Time:  8:47 PM  Group Topic/Focus:  Wrap-Up Group:   The focus of this group is to help patients review their daily goal of treatment and discuss progress on daily workbooks.  Participation Level:  Did Not Attend  Participation Quality:  Did Not Attend  Affect:  Did Not Attend  Cognitive:  Did Not Attend  Insight: None  Engagement in Group:  Did Not Attend  Modes of Intervention:  Did Not Attend  Additional Comments:  Pt did not attend evening wrap up group tonight.  Felipa Furnace 11/27/2019, 8:47 PM

## 2019-11-28 DIAGNOSIS — F200 Paranoid schizophrenia: Secondary | ICD-10-CM | POA: Diagnosis not present

## 2019-11-28 LAB — GLUCOSE, CAPILLARY: Glucose-Capillary: 164 mg/dL — ABNORMAL HIGH (ref 70–99)

## 2019-11-28 MED ORDER — OLANZAPINE 5 MG PO TBDP
5.0000 mg | ORAL_TABLET | Freq: Every day | ORAL | Status: DC
  Administered 2019-11-28 – 2019-11-29 (×2): 5 mg via ORAL
  Filled 2019-11-28 (×4): qty 1

## 2019-11-28 MED ORDER — METFORMIN HCL 850 MG PO TABS
850.0000 mg | ORAL_TABLET | Freq: Two times a day (BID) | ORAL | Status: DC
  Administered 2019-11-29 – 2019-11-30 (×4): 850 mg via ORAL
  Filled 2019-11-28 (×7): qty 1

## 2019-11-28 MED ORDER — OLANZAPINE 7.5 MG PO TABS
15.0000 mg | ORAL_TABLET | Freq: Every day | ORAL | Status: DC
  Filled 2019-11-28 (×2): qty 2

## 2019-11-28 MED ORDER — METFORMIN HCL 850 MG PO TABS
850.0000 mg | ORAL_TABLET | Freq: Two times a day (BID) | ORAL | Status: DC
  Filled 2019-11-28 (×2): qty 1

## 2019-11-28 MED ORDER — OLANZAPINE 5 MG PO TBDP
15.0000 mg | ORAL_TABLET | Freq: Every day | ORAL | Status: DC
  Administered 2019-11-28: 15 mg via ORAL
  Filled 2019-11-28 (×3): qty 3

## 2019-11-28 MED ORDER — METFORMIN HCL 500 MG PO TABS
750.0000 mg | ORAL_TABLET | Freq: Once | ORAL | Status: AC
  Administered 2019-11-28: 750 mg via ORAL
  Filled 2019-11-28 (×2): qty 2

## 2019-11-28 NOTE — Progress Notes (Signed)
[facility] MD Progress Note  11/28/2019 1:38 PM James [name]  MRN:  [id number] Subjective:  Patient is a 39 year old male with a past psychiatric history significant for schizophrenia who presented on 11/23/2019 secondary to noncompliance of medications.  He apparently had been getting into fights with his father, talking to himself and laughing to himself and appearing to respond to internal stimuli.  Objective: Patient is seen and examined.  Patient is a 39 year old male with the above-stated past psychiatric history seen in follow-up.  He is essentially unchanged from yesterday.  His room is a wreck today.  His hygiene continues to be poor.  He has not showered.  He continues to deny auditory hallucinations, but I have seen him personally having responding to internal stimuli.  Staff have also seen this.  He was switched to Zyprexa yesterday secondary to ineffectiveness of Abilify.  His vital signs are stable, he is afebrile.  His sleep has improved with the Zyprexa.  He slept 6.75 hours last night.  His lithium level from 10/23 was 0.56.  Principal Problem: <principal problem not specified> Diagnosis: Active Problems:   Schizophrenia (HCC)  Total Time spent with patient: 20 minutes  Past Psychiatric History: See admission H&P  Past Medical History:  Past Medical History:  Diagnosis Date  . Gastroesophageal reflux disease 11/25/2017   History reviewed. No pertinent surgical history. Family History:  Family History  Problem Relation Age of Onset  . Diabetes Mother   . Diabetes Father   . Prostate cancer Father    Family Psychiatric  History: See admission H&P Social History:  Social History   Substance and Sexual Activity  Alcohol Use No  . Alcohol/week: 0.0 standard drinks   Comment: rare alcohol     Social History   Substance and Sexual Activity  Drug Use No    Social History   Socioeconomic History  . Marital status: Single    Spouse name: Not on file  . Number of  children: Not on file  . Years of education: Not on file  . Highest education level: Not on file  Occupational History  . Not on file  Tobacco Use  . Smoking status: Current Every Day Smoker    Packs/day: 0.50    Types: Cigarettes  . Smokeless tobacco: Never Used  Substance and Sexual Activity  . Alcohol use: No    Alcohol/week: 0.0 standard drinks    Comment: rare alcohol  . Drug use: No  . Sexual activity: Yes    Comment: male  Other Topics Concern  . Not on file  Social History Narrative  . Not on file   Social Determinants of Health   Financial Resource Strain:   . Difficulty of Paying Living Expenses: Not on file  Food Insecurity:   . Worried About Programme researcher, broadcasting/film/video in the Last Year: Not on file  . Ran Out of Food in the Last Year: Not on file  Transportation Needs:   . Lack of Transportation (Medical): Not on file  . Lack of Transportation (Non-Medical): Not on file  Physical Activity:   . Days of Exercise per Week: Not on file  . Minutes of Exercise per Session: Not on file  Stress:   . Feeling of Stress : Not on file  Social Connections:   . Frequency of Communication with Friends and Family: Not on file  . Frequency of Social Gatherings with Friends and Family: Not on file  . Attends Religious Services: Not on file  .  Active Member of Clubs or Organizations: Not on file  . Attends Banker Meetings: Not on file  . Marital Status: Not on file   Additional Social History:                         Sleep: Fair  Appetite:  Fair  Current Medications: Current Facility-Administered Medications  Medication Dose Route Frequency Provider Last Rate Last Admin  . acetaminophen (TYLENOL) tablet 650 mg  650 mg Oral Q4H PRN Denzil Magnuson, NP      . alum & mag hydroxide-simeth (MAALOX/MYLANTA) 200-200-20 MG/5ML suspension 30 mL  30 mL Oral Q6H PRN Denzil Magnuson, NP      . benztropine (COGENTIN) tablet 0.5 mg  0.5 mg Oral BID Denzil Magnuson, NP   0.5 mg at 11/28/19 0747  . buPROPion (WELLBUTRIN SR) 12 hr tablet 150 mg  150 mg Oral Daily Cristofano, Worthy Rancher, MD   150 mg at 11/28/19 0747  . famotidine (PEPCID) tablet 20 mg  20 mg Oral Daily Denzil Magnuson, NP   20 mg at 11/28/19 0747  . lithium carbonate (ESKALITH) CR tablet 450 mg  450 mg Oral BID Denzil Magnuson, NP   450 mg at 11/28/19 0747  . metFORMIN (GLUCOPHAGE) tablet 500 mg  500 mg Oral BID WC Denzil Magnuson, NP   500 mg at 11/28/19 0747  . nicotine (NICODERM CQ - dosed in mg/24 hours) patch 14 mg  14 mg Transdermal Daily Denzil Magnuson, NP      . OLANZapine (ZYPREXA) tablet 15 mg  15 mg Oral QHS Antonieta Pert, MD      . OLANZapine zydis (ZYPREXA) disintegrating tablet 5 mg  5 mg Oral Daily Antonieta Pert, MD   5 mg at 11/28/19 0748  . ondansetron (ZOFRAN) tablet 4 mg  4 mg Oral Q8H PRN Denzil Magnuson, NP      . traZODone (DESYREL) tablet 100 mg  100 mg Oral QHS PRN Nira Conn A, NP   100 mg at 11/27/19 0036  . zolpidem (AMBIEN) tablet 5 mg  5 mg Oral QHS Cristofano, Worthy Rancher, MD   5 mg at 11/27/19 0036    Lab Results:  Results for orders placed or performed during the hospital encounter of 11/22/19 (from the past 48 hour(s))  Lithium level     Status: Abnormal   Collection Time: 11/27/19  6:37 AM  Result Value Ref Range   Lithium Lvl 0.56 (L) 0.60 - 1.20 mmol/L    Comment: Performed at [facility], [address] [place], [place] [address]  Glucose, capillary     Status: Abnormal   Collection Time: 11/28/19  6:17 AM  Result Value Ref Range   Glucose-Capillary 164 (H) 70 - 99 mg/dL    Comment: Glucose reference range applies only to samples taken after fasting for at least 8 hours.    Blood Alcohol level:  Lab Results  Component Value Date   ETH <10 11/20/2019   ETH <10 06/27/2018    Metabolic Disorder Labs: Lab Results  Component Value Date   HGBA1C 5.8 (H) 11/23/2019   MPG 120 11/23/2019   MPG 111.15  06/27/2018   Lab Results  Component Value Date   PROLACTIN 3.3 (L) 11/23/2019   Lab Results  Component Value Date   CHOL 153 11/23/2019   TRIG 46 11/23/2019   HDL 58 11/23/2019   CHOLHDL 2.6 11/23/2019   VLDL 9 11/23/2019  LDLCALC 86 11/23/2019   LDLCALC 72 06/27/2018    Physical Findings: AIMS: Facial and Oral Movements Muscles of Facial Expression: None, normal Lips and Perioral Area: None, normal Jaw: None, normal Tongue: None, normal,Extremity Movements Upper (arms, wrists, hands, fingers): None, normal Lower (legs, knees, ankles, toes): None, normal, Trunk Movements Neck, shoulders, hips: None, normal, Overall Severity Severity of abnormal movements (highest score from questions above): None, normal Incapacitation due to abnormal movements: None, normal Patient's awareness of abnormal movements (rate only patient's report): No Awareness, Dental Status Current problems with teeth and/or dentures?: No Does patient usually wear dentures?: No  CIWA:    COWS:     Musculoskeletal: Strength & Muscle Tone: within normal limits Gait & Station: normal Patient leans: N/A  Psychiatric Specialty Exam: Physical Exam Vitals and nursing note reviewed.  Constitutional:      Appearance: Normal appearance.  HENT:     Head: Normocephalic and atraumatic.  Cardiovascular:     Pulses: Normal pulses.  Neurological:     General: No focal deficit present.     Mental Status: He is alert.     Review of Systems  Blood pressure 96/75, pulse 95, temperature 97.9 F (36.6 C), temperature source Oral, resp. rate 18, height 5\' 11"  (1.803 m), weight 72.1 kg, SpO2 95 %.Body mass index is 22.18 kg/m.  General Appearance: Disheveled  Eye Contact:  Fair  Speech:  Normal Rate  Volume:  Decreased  Mood:  Dysphoric  Affect:  Flat  Thought Process:  Disorganized and Descriptions of Associations: Loose  Orientation:  Negative  Thought Content:  Delusions, Hallucinations: Auditory and  Paranoid Ideation  Suicidal Thoughts:  No  Homicidal Thoughts:  No  Memory:  Immediate;   Poor Recent;   Poor Remote;   Poor  Judgement:  Impaired  Insight:  Lacking  Psychomotor Activity:  Increased  Concentration:  Concentration: Fair and Attention Span: Fair  Recall:  of Knowledge:  Fair  Language:  Fair  Akathisia:  Negative  Handed:  Right  AIMS (if indicated):     Assets:  Desire for Improvement Resilience  ADL's:  Impaired  Cognition:  WNL  Sleep:  Number of Hours: 6.75     Treatment Plan Summary: Daily contact with patient to assess and evaluate symptoms and progress in treatment, Medication management and Plan : Patient is seen and examined.  Patient is a 39 year old male with the above-stated past psychiatric history who is seen in follow-up.  Diagnosis: 1.  Schizophrenia 2.  Diabetes mellitus type 2  Pertinent findings on examination today: 1.  Admitted to auditory hallucinations. 2.  Clearly paranoid. 3.  Poor hygiene. 4.  Lithium level 0.56 5.  Blood sugar today 164  Plan: 1.  Continue Cogentin 0.5 mg p.o. twice daily for side effects of medication. 2.  Continue Wellbutrin SR 150 mg p.o. daily for depression and anxiety. 3.  Continue Pepcid 20 mg p.o. daily for GERD. 4.  Continue lithium carbonate CR 450 mg p.o. twice daily for mood stability. 5.  Increase Glucophage 2000 mg p.o. twice daily with food for diabetes mellitus 6.  Increase Zyprexa Zydis to 15 mg p.o. nightly and 5 mg p.o. daily for psychosis. 7.  Continue Zofran 4 mg p.o. every 8 hours as needed nausea and vomiting. 8.  Continue daily blood sugar checks. 9.  Continue trazodone 100 mg p.o. nightly as needed insomnia. 10.  Disposition planning-in progress. 40, MD 11/28/2019, 1:38 PM

## 2019-11-28 NOTE — Progress Notes (Signed)
Patient denies SI/HI. He denies AVH. He appears to be responding to internal stimuli and is seen talking to himself in the hallway as he paces. He denies depression and anxiety. He reports fair sleep last night.   Orders reviewed. Vital signs reviewed. Verbal support provided. 15 minute checks performed for safety.   Patient compliant with taking medications and denies any medication side effects.

## 2019-11-28 NOTE — [facility] Group Notes (Signed)
[facility] LCSW Group Therapy Note ° °Date/Time:  11/28/2019  11:00AM-12:00PM ° °Type of Therapy and Topic:  Group Therapy:  Music and Mood ° °Participation Level:  Did Not Attend  ° °Description of Group: °In this process group, members listened to a variety of genres of music and identified that different types of music evoke different responses.  Patients were encouraged to identify music that was soothing for them and music that was energizing for them.  Patients discussed how this knowledge can help with wellness and recovery in various ways including managing depression and anxiety as well as encouraging healthy sleep habits.   ° °Therapeutic Goals: °1. Patients will explore the impact of different varieties of music on mood °2. Patients will verbalize the thoughts they have when listening to different types of music °3. Patients will identify music that is soothing to them as well as music that is energizing to them °4. Patients will discuss how to use this knowledge to assist in maintaining wellness and recovery °5. Patients will explore the use of music as a coping skill ° °Summary of Patient Progress:  Did not attend. °Therapeutic Modalities: °Solution Focused Brief Therapy °Activity ° ° °Annlouise Gerety D. Danaysia Rader, LCSW ° ° ° °

## 2019-11-28 NOTE — Progress Notes (Signed)
   11/28/19 1941  COVID-19 Daily Checkoff  Have you had a fever (temp > 37.80C/100F)  in the past 24 hours?  No  If you have had runny nose, nasal congestion, sneezing in the past 24 hours, has it worsened? No  COVID-19 EXPOSURE  Have you traveled outside the state in the past 14 days? No  Have you been in contact with someone with a confirmed diagnosis of COVID-19 or PUI in the past 14 days without wearing appropriate PPE? No  Have you been living in the same home as a person with confirmed diagnosis of COVID-19 or a PUI (household contact)? No  Have you been diagnosed with COVID-19? No

## 2019-11-28 NOTE — Progress Notes (Signed)
Adult Psychoeducational Group Note  Date:  11/28/2019 Time:  9:56 PM  Group Topic/Focus:  Wrap-Up Group:   The focus of this group is to help patients review their daily goal of treatment and discuss progress on daily workbooks.  Participation Level:  Minimal  Participation Quality:  Appropriate  Affect:  Anxious and Flat  Cognitive:  Disorganized and Confused  Insight: Lacking and Limited  Engagement in Group:  Lacking and Limited  Modes of Intervention:  Discussion  Additional Comments:  Pt stated his goal for today was to focus on his treatment plan. Pt stated he accomplished his goal today. Pt stated he was able to talk with his doctor and social worker about his care today. Pt rated his overall day a 10.  Pt stated he was able to contact his mother today, which improved his day. Pt stated he was able to attend all meals. Pt stated he took all medications provided today. Pt stated he felt better about himself today.  Pt stated his appetite was pretty good today. Pt rated sleep last night was pretty good. Pt stated he was no physical pain today.  Pt deny auditory or visual hallucinations. Pt denies thoughts of harming himself or others. Pt stated he would alert staff if anything changes. James [name] 11/28/2019, 9:56 PM

## 2019-11-28 NOTE — Progress Notes (Signed)
   11/27/19 2300  Psych Admission Type (Psych Patients Only)  Admission Status Involuntary  Psychosocial Assessment  Patient Complaints None  Eye Contact Brief  Facial Expression Flat  Affect Preoccupied  Speech Logical/coherent  Interaction Minimal  Motor Activity Pacing;Restless;Fidgety  Appearance/Hygiene Disheveled  Behavior Characteristics Restless  Mood Pleasant;Preoccupied  Thought Process  Coherency Concrete thinking;Disorganized  Content Preoccupation  Delusions None reported or observed  Perception Hallucinations  Hallucination None reported or observed  Judgment Impaired  Confusion None  Danger to Self  Current suicidal ideation? Denies  Danger to Others  Danger to Others None reported or observed

## 2019-11-29 DIAGNOSIS — F200 Paranoid schizophrenia: Secondary | ICD-10-CM | POA: Diagnosis not present

## 2019-11-29 LAB — GLUCOSE, CAPILLARY: Glucose-Capillary: 97 mg/dL (ref 70–99)

## 2019-11-29 MED ORDER — OLANZAPINE 10 MG PO TBDP
10.0000 mg | ORAL_TABLET | Freq: Every day | ORAL | Status: DC
  Administered 2019-11-30 – 2019-12-02 (×3): 10 mg via ORAL
  Filled 2019-11-29 (×4): qty 1

## 2019-11-29 MED ORDER — OLANZAPINE 10 MG PO TBDP
20.0000 mg | ORAL_TABLET | Freq: Every day | ORAL | Status: DC
  Administered 2019-11-29: 20 mg via ORAL
  Filled 2019-11-29 (×4): qty 2

## 2019-11-29 NOTE — Tx Team (Signed)
Interdisciplinary Treatment and Diagnostic Plan Update  11/29/2019 Time of Session: 1012AM James [name] MRN: [id number]  Principal Diagnosis: <principal problem not specified>  Secondary Diagnoses: Active Problems:   Schizophrenia (HCC)   Current Medications:  Current Facility-Administered Medications  Medication Dose Route Frequency Provider Last Rate Last Admin  . acetaminophen (TYLENOL) tablet 650 mg  650 mg Oral Q4H PRN Denzil Magnuson, NP      . alum & mag hydroxide-simeth (MAALOX/MYLANTA) 200-200-20 MG/5ML suspension 30 mL  30 mL Oral Q6H PRN Denzil Magnuson, NP      . benztropine (COGENTIN) tablet 0.5 mg  0.5 mg Oral BID Denzil Magnuson, NP   0.5 mg at 11/29/19 0857  . buPROPion (WELLBUTRIN SR) 12 hr tablet 150 mg  150 mg Oral Daily Cristofano, Worthy Rancher, MD   150 mg at 11/29/19 0857  . famotidine (PEPCID) tablet 20 mg  20 mg Oral Daily Denzil Magnuson, NP   20 mg at 11/29/19 0857  . lithium carbonate (ESKALITH) CR tablet 450 mg  450 mg Oral BID Denzil Magnuson, NP   450 mg at 11/29/19 0858  . metFORMIN (GLUCOPHAGE) tablet 850 mg  850 mg Oral BID WC Antonieta Pert, MD   850 mg at 11/29/19 0857  . nicotine (NICODERM CQ - dosed in mg/24 hours) patch 14 mg  14 mg Transdermal Daily Denzil Magnuson, NP      . OLANZapine zydis (ZYPREXA) disintegrating tablet 15 mg  15 mg Oral QHS Antonieta Pert, MD   15 mg at 11/28/19 2052  . OLANZapine zydis (ZYPREXA) disintegrating tablet 5 mg  5 mg Oral Daily Antonieta Pert, MD   5 mg at 11/29/19 0857  . ondansetron (ZOFRAN) tablet 4 mg  4 mg Oral Q8H PRN Denzil Magnuson, NP      . traZODone (DESYREL) tablet 100 mg  100 mg Oral QHS PRN Nira Conn A, NP   100 mg at 11/27/19 0036  . zolpidem (AMBIEN) tablet 5 mg  5 mg Oral QHS Cristofano, Worthy Rancher, MD   5 mg at 11/28/19 2051   PTA Medications: Medications Prior to Admission  Medication Sig Dispense Refill Last Dose  . ARIPiprazole (ABILIFY) 20 MG tablet Take 20 mg by mouth  daily.     . benztropine (COGENTIN) 0.5 MG tablet Take 0.5 mg by mouth 2 (two) times daily.     Marland Kitchen buPROPion (ZYBAN) 150 MG 12 hr tablet Take 150 mg by mouth 2 (two) times daily.     . famotidine (PEPCID) 20 MG tablet Take 1 tablet (20 mg total) by mouth daily. (Patient not taking: Reported on 11/21/2019) 30 tablet 3   . lithium carbonate (ESKALITH) 450 MG CR tablet Take 450 mg by mouth 2 (two) times daily.     . metFORMIN (GLUCOPHAGE) 500 MG tablet TAKE 1 TABLET BY MOUTH TWICE DAILY WITH MEALS (Patient taking differently: Take 500 mg by mouth 2 (two) times daily with a meal. ) 180 tablet 0     Patient Stressors: Marital or family conflict Medication change or noncompliance  Patient Strengths: Average or above average intelligence Supportive family/friends  Treatment Modalities: Medication Management, Group therapy, Case management,  1 to 1 session with clinician, Psychoeducation, Recreational therapy.   Physician Treatment Plan for Primary Diagnosis: <principal problem not specified> Long Term Goal(s): Improvement in symptoms so as ready for discharge   Short Term Goals: Ability to identify changes in lifestyle to reduce recurrence of condition will improve Ability to verbalize feelings will improve  Ability to demonstrate self-control will improve Ability to identify and develop effective coping behaviors will improve Ability to maintain clinical measurements within normal limits will improve Compliance with prescribed medications will improve  Medication Management: Evaluate patient's response, side effects, and tolerance of medication regimen.  Therapeutic Interventions: 1 to 1 sessions, Unit Group sessions and Medication administration.  Evaluation of Outcomes: Progressing  Physician Treatment Plan for Secondary Diagnosis: Active Problems:   Schizophrenia (HCC)  Long Term Goal(s): Improvement in symptoms so as ready for discharge   Short Term Goals: Ability to identify  changes in lifestyle to reduce recurrence of condition will improve Ability to verbalize feelings will improve Ability to demonstrate self-control will improve Ability to identify and develop effective coping behaviors will improve Ability to maintain clinical measurements within normal limits will improve Compliance with prescribed medications will improve     Medication Management: Evaluate patient's response, side effects, and tolerance of medication regimen.  Therapeutic Interventions: 1 to 1 sessions, Unit Group sessions and Medication administration.  Evaluation of Outcomes: Progressing   RN Treatment Plan for Primary Diagnosis: <principal problem not specified> Long Term Goal(s): Knowledge of disease and therapeutic regimen to maintain health will improve  Short Term Goals: Ability to participate in decision making will improve, Ability to verbalize feelings will improve and Compliance with prescribed medications will improve  Medication Management: RN will administer medications as ordered by provider, will assess and evaluate patient's response and provide education to patient for prescribed medication. RN will report any adverse and/or side effects to prescribing provider.  Therapeutic Interventions: 1 on 1 counseling sessions, Psychoeducation, Medication administration, Evaluate responses to treatment, Monitor vital signs and CBGs as ordered, Perform/monitor CIWA, COWS, AIMS and Fall Risk screenings as ordered, Perform wound care treatments as ordered.  Evaluation of Outcomes: Progressing   LCSW Treatment Plan for Primary Diagnosis: <principal problem not specified> Long Term Goal(s): Safe transition to appropriate next level of care at discharge, Engage patient in therapeutic group addressing interpersonal concerns.  Short Term Goals: Engage patient in aftercare planning with referrals and resources, Increase emotional regulation, Facilitate acceptance of mental health  diagnosis and concerns and Increase skills for wellness and recovery  Therapeutic Interventions: Assess for all discharge needs, 1 to 1 time with Social worker, Explore available resources and support systems, Assess for adequacy in community support network, Educate family and significant other(s) on suicide prevention, Complete Psychosocial Assessment, Interpersonal group therapy.  Evaluation of Outcomes: Progressing   Progress in Treatment: Attending groups: No. Participating in groups: No. Taking medication as prescribed: Yes. Toleration medication: Yes. Family/Significant other contact made: Yes, individual(s) contacted:  mother Patient understands diagnosis: No. Discussing patient identified problems/goals with staff: Yes. Medical problems stabilized or resolved: No. Denies suicidal/homicidal ideation: Yes. Issues/concerns per patient self-inventory: No. Other: None  New problem(s) identified: Yes, Describe:  Pt admitted for mood instability. Pt does lack some insight regarding reason for admission.  New Short Term/Long Term Goal(s): SW will contact Pt's mother / guardian for continuity of care. Pt will be encouraged to attend groups. SW will ensure Pt has appropriate out-patient services arranged prior to discharge.  Patient Goals:  "I don't have a goal"  Discharge Plan or Barriers: SW will continue to assess.  Reason for Continuation of Hospitalization: Anxiety Medication stabilization  Estimated Length of Stay: 3-5 Days  Attendees: Patient:  11/29/2019 10:37 AM  Physician:  11/29/2019 10:37 AM  Nursing:  11/29/2019 10:37 AM  RN Care Manager: 11/29/2019 10:37 AM  Social Worker: Sharyl Nimrod  Winferd Humphrey 11/29/2019 10:37 AM  Recreational Therapist:  11/29/2019 10:37 AM  Other:  11/29/2019 10:37 AM  Other:  11/29/2019 10:37 AM  Other: 11/29/2019 10:37 AM    Scribe for Treatment Team: Otelia Santee, LCSW 11/29/2019 10:37 AM

## 2019-11-29 NOTE — Progress Notes (Signed)
[facility] MD Progress Note  11/29/2019 11:45 AM James [name]  MRN:  [id number] Subjective:  Patient is a 39 year old male with a past psychiatric history significant for schizophrenia who presented on 11/23/2019 secondary to noncompliance of medications. He apparently had been getting into fights with his father, talking to himself and laughing to himself and appearing to respond to internal stimuli.  Objective: Patient is seen and examined.  Patient is a 39 year old male with the above-stated past psychiatric history who is seen in follow-up.  He is perhaps slightly better today.  His sleep did improve to a bit.  His speech is very difficult to understand because he speaks so softly, but he does appear a little bit less disorganized.  We discussed his previous medications.  Review of the electronic medical record revealed that he had been previously treated with Clozaril, Latuda, Abilify, lithium and Zyprexa.  He was unable to remember any other medications.  He apparently does have some form of an allergy to Clozaril.  He stated he had not been treated with Haldol in the past.  He continues to have psychomotor agitation and appears to be responding to internal stimuli.  Again he continues to deny that he is hearing any voices and that "I dislike to talk to myself".  We discussed his living circumstances, and he stated that he lives at home with his parents.  I asked if his parents made sure that he took his medications regularly.  He stated that recently they had not.  Again he denied any auditory or visual hallucinations.  He denied any suicidal or homicidal ideation.  He did sleep well last night.  He slept 6.75 hours.  He denied any side effects to his current medications.  His vital signs are stable, he is afebrile.  His lithium level from 10/23 was 0.56.  Renal function was normal from previous laboratories.  TSH was low normal on 11/23/2019.  His blood sugar this morning was 97.  Principal Problem:  <principal problem not specified> Diagnosis: Active Problems:   Schizophrenia (HCC)  Total Time spent with patient: 20 minutes  Past Psychiatric History: See admission H&P  Past Medical History:  Past Medical History:  Diagnosis Date  . Gastroesophageal reflux disease 11/25/2017   History reviewed. No pertinent surgical history. Family History:  Family History  Problem Relation Age of Onset  . Diabetes Mother   . Diabetes Father   . Prostate cancer Father    Family Psychiatric  History: See admission H&P Social History:  Social History   Substance and Sexual Activity  Alcohol Use No  . Alcohol/week: 0.0 standard drinks   Comment: rare alcohol     Social History   Substance and Sexual Activity  Drug Use No    Social History   Socioeconomic History  . Marital status: Single    Spouse name: Not on file  . Number of children: Not on file  . Years of education: Not on file  . Highest education level: Not on file  Occupational History  . Not on file  Tobacco Use  . Smoking status: Current Every Day Smoker    Packs/day: 0.50    Types: Cigarettes  . Smokeless tobacco: Never Used  Substance and Sexual Activity  . Alcohol use: No    Alcohol/week: 0.0 standard drinks    Comment: rare alcohol  . Drug use: No  . Sexual activity: Yes    Comment: male  Other Topics Concern  . Not on file  Social History Narrative  . Not on file   Social Determinants of Health   Financial Resource Strain:   . Difficulty of Paying Living Expenses: Not on file  Food Insecurity:   . Worried About Programme researcher, broadcasting/film/video in the Last Year: Not on file  . Ran Out of Food in the Last Year: Not on file  Transportation Needs:   . Lack of Transportation (Medical): Not on file  . Lack of Transportation (Non-Medical): Not on file  Physical Activity:   . Days of Exercise per Week: Not on file  . Minutes of Exercise per Session: Not on file  Stress:   . Feeling of Stress : Not on file   Social Connections:   . Frequency of Communication with Friends and Family: Not on file  . Frequency of Social Gatherings with Friends and Family: Not on file  . Attends Religious Services: Not on file  . Active Member of Clubs or Organizations: Not on file  . Attends Banker Meetings: Not on file  . Marital Status: Not on file   Additional Social History:                         Sleep: Good  Appetite:  Fair  Current Medications: Current Facility-Administered Medications  Medication Dose Route Frequency Provider Last Rate Last Admin  . acetaminophen (TYLENOL) tablet 650 mg  650 mg Oral Q4H PRN Denzil Magnuson, NP      . alum & mag hydroxide-simeth (MAALOX/MYLANTA) 200-200-20 MG/5ML suspension 30 mL  30 mL Oral Q6H PRN Denzil Magnuson, NP      . benztropine (COGENTIN) tablet 0.5 mg  0.5 mg Oral BID Denzil Magnuson, NP   0.5 mg at 11/29/19 0857  . buPROPion (WELLBUTRIN SR) 12 hr tablet 150 mg  150 mg Oral Daily Cristofano, Worthy Rancher, MD   150 mg at 11/29/19 0857  . famotidine (PEPCID) tablet 20 mg  20 mg Oral Daily Denzil Magnuson, NP   20 mg at 11/29/19 0857  . lithium carbonate (ESKALITH) CR tablet 450 mg  450 mg Oral BID Denzil Magnuson, NP   450 mg at 11/29/19 0858  . metFORMIN (GLUCOPHAGE) tablet 850 mg  850 mg Oral BID WC Antonieta Pert, MD   850 mg at 11/29/19 0857  . nicotine (NICODERM CQ - dosed in mg/24 hours) patch 14 mg  14 mg Transdermal Daily Denzil Magnuson, NP      . Melene Muller ON 11/30/2019] OLANZapine zydis (ZYPREXA) disintegrating tablet 10 mg  10 mg Oral Daily Antonieta Pert, MD      . OLANZapine zydis (ZYPREXA) disintegrating tablet 20 mg  20 mg Oral QHS Antonieta Pert, MD      . ondansetron [facility]) tablet 4 mg  4 mg Oral Q8H PRN Denzil Magnuson, NP      . traZODone (DESYREL) tablet 100 mg  100 mg Oral QHS PRN Nira Conn A, NP   100 mg at 11/27/19 0036  . zolpidem (AMBIEN) tablet 5 mg  5 mg Oral QHS Cristofano, Worthy Rancher, MD   5 mg  at 11/28/19 2051    Lab Results:  Results for orders placed or performed during the hospital encounter of 11/22/19 (from the past 48 hour(s))  Glucose, capillary     Status: Abnormal   Collection Time: 11/28/19  6:17 AM  Result Value Ref Range   Glucose-Capillary 164 (H) 70 - 99 mg/dL    Comment: Glucose reference  range applies only to samples taken after fasting for at least 8 hours.  Glucose, capillary     Status: None   Collection Time: 11/29/19  6:26 AM  Result Value Ref Range   Glucose-Capillary 97 70 - 99 mg/dL    Comment: Glucose reference range applies only to samples taken after fasting for at least 8 hours.    Blood Alcohol level:  Lab Results  Component Value Date   ETH <10 11/20/2019   ETH <10 06/27/2018    Metabolic Disorder Labs: Lab Results  Component Value Date   HGBA1C 5.8 (H) 11/23/2019   MPG 120 11/23/2019   MPG 111.15 06/27/2018   Lab Results  Component Value Date   PROLACTIN 3.3 (L) 11/23/2019   Lab Results  Component Value Date   CHOL 153 11/23/2019   TRIG 46 11/23/2019   HDL 58 11/23/2019   CHOLHDL 2.6 11/23/2019   VLDL 9 11/23/2019   LDLCALC 86 11/23/2019   LDLCALC 72 06/27/2018    Physical Findings: AIMS: Facial and Oral Movements Muscles of Facial Expression: None, normal Lips and Perioral Area: None, normal Jaw: None, normal Tongue: None, normal,Extremity Movements Upper (arms, wrists, hands, fingers): None, normal Lower (legs, knees, ankles, toes): None, normal, Trunk Movements Neck, shoulders, hips: None, normal, Overall Severity Severity of abnormal movements (highest score from questions above): None, normal Incapacitation due to abnormal movements: None, normal Patient's awareness of abnormal movements (rate only patient's report): No Awareness, Dental Status Current problems with teeth and/or dentures?: No Does patient usually wear dentures?: No  CIWA:    COWS:     Musculoskeletal: Strength & Muscle Tone: within normal  limits Gait & Station: normal Patient leans: N/A  Psychiatric Specialty Exam: Physical Exam Vitals and nursing note reviewed.  HENT:     Head: Normocephalic and atraumatic.  Pulmonary:     Effort: Pulmonary effort is normal.  Neurological:     General: No focal deficit present.     Mental Status: He is alert and oriented to person, place, and time.     Review of Systems  Blood pressure (!) 116/94, pulse (!) 101, temperature 97.8 F (36.6 C), temperature source Oral, resp. rate 18, height 5\' 11"  (1.803 m), weight 72.1 kg, SpO2 95 %.Body mass index is 22.18 kg/m.  General Appearance: Disheveled  Eye Contact:  Minimal  Speech:  Normal Rate  Volume:  Decreased  Mood:  Dysphoric  Affect:  Congruent  Thought Process:  Goal Directed and Descriptions of Associations: Circumstantial  Orientation:  Full (Time, Place, and Person)  Thought Content:  Delusions, Hallucinations: Auditory and Paranoid Ideation  Suicidal Thoughts:  No  Homicidal Thoughts:  No  Memory:  Immediate;   Fair Recent;   Fair Remote;   Fair  Judgement:  Intact  Insight:  Fair  Psychomotor Activity:  Increased  Concentration:  Concentration: Fair and Attention Span: Fair  Recall:  of Knowledge:  Fair  Language:  Fair  Akathisia:  Negative  Handed:  Right  AIMS (if indicated):     Assets:  Desire for Improvement Housing Resilience Social Support  ADL's:  Impaired  Cognition:  WNL  Sleep:  Number of Hours: 6.75     Treatment Plan Summary: Daily contact with patient to assess and evaluate symptoms and progress in treatment, Medication management and Plan : Patient is seen and examined.  Patient is a 39 year old male with the above-stated past psychiatric history who is seen in follow-up.   Diagnosis: 1.  Schizophrenia 2. Diabetes mellitus type 2  Pertinent findings on examination today: 1.  He stated that the auditory hallucinations have essentially stopped today.  He still appears to be  having intermittent responses to internal stimuli. 2.  Paranoia is still present but decreased. 3.  His hygiene is still poor, but staff did get him in the shower today. 4.  Blood sugar has come down with increased dosage of Metformin.  Blood sugar this morning is 97. 5.  Sleep is improved to 6.75 hours.  Plan: 1.  Continue Cogentin 0.5 mg p.o. twice daily for side effects of medication. 2.  Continue Wellbutrin SR 150 mg p.o. daily for depression and anxiety. 3.  Continue Pepcid 20 mg p.o. daily for GERD. 4.  Continue lithium carbonate CR 450 mg p.o. twice daily for mood stability. 5.  Continue Glucophage 850 mg p.o. twice daily with food for diabetes mellitus 6.  Increase Zyprexa Zydis to 20 mg p.o. nightly and 10 mg p.o. daily for psychosis. 7.  Continue Zofran 4 mg p.o. every 8 hours as needed nausea and vomiting. 8.  Continue daily blood sugar checks. 9.  Continue trazodone 100 mg p.o. nightly as needed insomnia. 10.  Disposition planning-in progress.  Antonieta PertGreg Lawson Jisel Fleet, MD 11/29/2019, 11:45 AM

## 2019-11-29 NOTE — Progress Notes (Signed)
Patient has been pacing the hallway again tonight. He is pleasant but still responding to internal stimuli. He was complaint with scheduled medications.

## 2019-11-29 NOTE — [facility] Group Notes (Signed)
Occupational Therapy Group Note Date: 11/29/2019 Group Topic/Focus: Self-Esteem and Coping Skills  Group Description: Group encouraged increased engagement and participation through discussion and activity focused on building self-esteem through positive affirmations. Patients were given multiple positive affirmation coloring sheets to choose from and were instructed to choose one that was important to them. Patients were then offered colored pencils to actively color and engage in activity with other group members and group leader.   Participation Level: Patient did not attend OT group session. Pt was asleep in bedroom and did not respond to personal invitation.   Modes of Intervention: Activity and Socialization  Patient Response to Interventions:  Resistant   Plan: Continue to engage patient in OT groups 2 - 3x/week.  11/29/2019  Lawton Dollinger, MOT, OTR/L   

## 2019-11-29 NOTE — Progress Notes (Signed)
   11/29/19 1000  Psych Admission Type (Psych Patients Only)  Admission Status Involuntary  Psychosocial Assessment  Patient Complaints Suspiciousness  Eye Contact Fair  Facial Expression Flat  Affect Preoccupied  Speech Logical/coherent  Interaction Forwards little;Minimal  Motor Activity Pacing;Fidgety  Appearance/Hygiene Unremarkable  Behavior Characteristics Pacing  Mood Preoccupied  Thought Process  Coherency Concrete thinking;Disorganized  Content Preoccupation  Delusions None reported or observed  Perception Hallucinations  Hallucination None reported or observed  Judgment Impaired  Confusion None  Danger to Self  Current suicidal ideation? Denies  Danger to Others  Danger to Others None reported or observed

## 2019-11-29 NOTE — Progress Notes (Signed)
Pt continues to pace the unit and keep to himself much of the evening.     11/29/19 2100  Psych Admission Type (Psych Patients Only)  Admission Status Involuntary  Psychosocial Assessment  Patient Complaints Suspiciousness  Eye Contact Fair  Facial Expression Flat  Affect Preoccupied  Speech Logical/coherent  Interaction Forwards little;Minimal  Motor Activity Pacing;Fidgety  Appearance/Hygiene Unremarkable  Behavior Characteristics Pacing  Mood Suspicious;Preoccupied  Thought Process  Coherency Concrete thinking;Disorganized  Content Preoccupation  Delusions None reported or observed  Perception Hallucinations  Hallucination None reported or observed  Judgment Impaired  Confusion None  Danger to Self  Current suicidal ideation? Denies  Danger to Others  Danger to Others None reported or observed

## 2019-11-29 NOTE — Progress Notes (Signed)
Adult Psychoeducational Group Note  Date:  11/29/2019 Time:  10:50 PM  Group Topic/Focus:  Wrap-Up Group:   The focus of this group is to help patients review their daily goal of treatment and discuss progress on daily workbooks.  Participation Level:  Did Not Attend  Participation Quality:  Did Not Attend  Affect:  Did Not Attend  Cognitive:  Did Not Attend  Insight: None  Engagement in Group:  Did Not Attend  Modes of Intervention:  Did Not Attend  Additional Comments:  Did not attend evening wrap up group tonight.  Felipa Furnace 11/29/2019, 10:50 PM

## 2019-11-29 NOTE — Plan of Care (Signed)
  Problem: Education: Goal: Ability to state activities that reduce stress will improve Outcome: Progressing   Problem: Self-Concept: Goal: Level of anxiety will decrease Outcome: Progressing   Problem: Coping: Goal: Ability to demonstrate self-control will improve Outcome: Progressing

## 2019-11-29 NOTE — Progress Notes (Signed)
Adult Psychoeducational Group Note  Date:  11/29/2019 Time:  6:13 PM  Group Topic/Focus:  Developing a Wellness Toolbox:   The focus of this group is to help patients develop a "wellness toolbox" with skills and strategies to promote recovery upon discharge.  Participation Level:  None  Participation Quality:  Redirectable  Affect:  Flat  Cognitive:  Confused  Insight: None  Engagement in Group:  Lacking  Modes of Intervention:  Discussion and Education  Additional Comments:  Pt attended the group session but did not participate in the discussion.  Joeanna Howdyshell E 11/29/2019, 6:13 PM

## 2019-11-30 DIAGNOSIS — F200 Paranoid schizophrenia: Secondary | ICD-10-CM | POA: Diagnosis not present

## 2019-11-30 LAB — GLUCOSE, CAPILLARY: Glucose-Capillary: 84 mg/dL (ref 70–99)

## 2019-11-30 NOTE — Progress Notes (Signed)
[facility] MD Progress Note  11/30/2019 10:17 AM James [name]  MRN:  [id number] Subjective:  Patient is a 39 year old male with a past psychiatric history significant for schizophrenia who presented on 11/23/2019 secondary to noncompliance of medications. He apparently had been getting into fights with his father, talking to himself and laughing to himself and appearing to respond to internal stimuli.  Objective: Patient is seen and examined.  Patient is a 39 year old male with the above-stated past psychiatric history who is seen in follow-up.  He appears to be still slowly progressing.  He did sleep better last night.  He denied auditory or visual hallucinations.  He is still seen on the unit at times with what appears to be responding to internal stimuli.  He denied any suicidal or homicidal ideation.  He actually was able to smile and engage to a degree today.  He is less disorganized.  Blood pressure initially was 96/76, repeat was 143/100.  Pulse was normal.  He is afebrile.  His sleep is improved to 8.25 hours last night.  No new laboratories.  His hemoglobin A1c from 10/19 was mildly elevated at 5.8.  Principal Problem: <principal problem not specified> Diagnosis: Active Problems:   Schizophrenia (HCC)  Total Time spent with patient: 20 minutes  Past Psychiatric History: See admission H&P  Past Medical History:  Past Medical History:  Diagnosis Date  . Gastroesophageal reflux disease 11/25/2017   History reviewed. No pertinent surgical history. Family History:  Family History  Problem Relation Age of Onset  . Diabetes Mother   . Diabetes Father   . Prostate cancer Father    Family Psychiatric  History: See admission H&P Social History:  Social History   Substance and Sexual Activity  Alcohol Use No  . Alcohol/week: 0.0 standard drinks   Comment: rare alcohol     Social History   Substance and Sexual Activity  Drug Use No    Social History   Socioeconomic History  .  Marital status: Single    Spouse name: Not on file  . Number of children: Not on file  . Years of education: Not on file  . Highest education level: Not on file  Occupational History  . Not on file  Tobacco Use  . Smoking status: Current Every Day Smoker    Packs/day: 0.50    Types: Cigarettes  . Smokeless tobacco: Never Used  Substance and Sexual Activity  . Alcohol use: No    Alcohol/week: 0.0 standard drinks    Comment: rare alcohol  . Drug use: No  . Sexual activity: Yes    Comment: male  Other Topics Concern  . Not on file  Social History Narrative  . Not on file   Social Determinants of Health   Financial Resource Strain:   . Difficulty of Paying Living Expenses: Not on file  Food Insecurity:   . Worried About Programme researcher, broadcasting/film/video in the Last Year: Not on file  . Ran Out of Food in the Last Year: Not on file  Transportation Needs:   . Lack of Transportation (Medical): Not on file  . Lack of Transportation (Non-Medical): Not on file  Physical Activity:   . Days of Exercise per Week: Not on file  . Minutes of Exercise per Session: Not on file  Stress:   . Feeling of Stress : Not on file  Social Connections:   . Frequency of Communication with Friends and Family: Not on file  . Frequency of Social Gatherings  with Friends and Family: Not on file  . Attends Religious Services: Not on file  . Active Member of Clubs or Organizations: Not on file  . Attends Banker Meetings: Not on file  . Marital Status: Not on file   Additional Social History:                         Sleep: Good  Appetite:  Fair  Current Medications: Current Facility-Administered Medications  Medication Dose Route Frequency Provider Last Rate Last Admin  . acetaminophen (TYLENOL) tablet 650 mg  650 mg Oral Q4H PRN Denzil Magnuson, NP      . alum & mag hydroxide-simeth (MAALOX/MYLANTA) 200-200-20 MG/5ML suspension 30 mL  30 mL Oral Q6H PRN Denzil Magnuson, NP      .  benztropine (COGENTIN) tablet 0.5 mg  0.5 mg Oral BID Denzil Magnuson, NP   0.5 mg at 11/30/19 0813  . buPROPion (WELLBUTRIN SR) 12 hr tablet 150 mg  150 mg Oral Daily Cristofano, Worthy Rancher, MD   150 mg at 11/30/19 0813  . famotidine (PEPCID) tablet 20 mg  20 mg Oral Daily Denzil Magnuson, NP   20 mg at 11/30/19 0813  . lithium carbonate (ESKALITH) CR tablet 450 mg  450 mg Oral BID Denzil Magnuson, NP   450 mg at 11/30/19 0813  . metFORMIN (GLUCOPHAGE) tablet 850 mg  850 mg Oral BID WC Antonieta Pert, MD   850 mg at 11/30/19 0813  . nicotine (NICODERM CQ - dosed in mg/24 hours) patch 14 mg  14 mg Transdermal Daily Denzil Magnuson, NP      . OLANZapine zydis (ZYPREXA) disintegrating tablet 10 mg  10 mg Oral Daily Antonieta Pert, MD   10 mg at 11/30/19 0813  . OLANZapine zydis (ZYPREXA) disintegrating tablet 20 mg  20 mg Oral QHS Antonieta Pert, MD   20 mg at 11/29/19 2030  . ondansetron (ZOFRAN) tablet 4 mg  4 mg Oral Q8H PRN Denzil Magnuson, NP      . traZODone (DESYREL) tablet 100 mg  100 mg Oral QHS PRN Nira Conn A, NP   100 mg at 11/29/19 2029  . zolpidem (AMBIEN) tablet 5 mg  5 mg Oral QHS Cristofano, Worthy Rancher, MD   5 mg at 11/29/19 2029    Lab Results:  Results for orders placed or performed during the hospital encounter of 11/22/19 (from the past 48 hour(s))  Glucose, capillary     Status: None   Collection Time: 11/29/19  6:26 AM  Result Value Ref Range   Glucose-Capillary 97 70 - 99 mg/dL    Comment: Glucose reference range applies only to samples taken after fasting for at least 8 hours.  Glucose, capillary     Status: None   Collection Time: 11/30/19  5:45 AM  Result Value Ref Range   Glucose-Capillary 84 70 - 99 mg/dL    Comment: Glucose reference range applies only to samples taken after fasting for at least 8 hours.    Blood Alcohol level:  Lab Results  Component Value Date   [facility] <10 11/20/2019   ETH <10 06/27/2018    Metabolic Disorder Labs: Lab Results   Component Value Date   HGBA1C 5.8 (H) 11/23/2019   MPG 120 11/23/2019   MPG 111.15 06/27/2018   Lab Results  Component Value Date   PROLACTIN 3.3 (L) 11/23/2019   Lab Results  Component Value Date   CHOL 153 11/23/2019  TRIG 46 11/23/2019   HDL 58 11/23/2019   CHOLHDL 2.6 11/23/2019   VLDL 9 11/23/2019   LDLCALC 86 11/23/2019   LDLCALC 72 06/27/2018    Physical Findings: AIMS: Facial and Oral Movements Muscles of Facial Expression: None, normal Lips and Perioral Area: None, normal Jaw: None, normal Tongue: None, normal,Extremity Movements Upper (arms, wrists, hands, fingers): None, normal Lower (legs, knees, ankles, toes): None, normal, Trunk Movements Neck, shoulders, hips: None, normal, Overall Severity Severity of abnormal movements (highest score from questions above): None, normal Incapacitation due to abnormal movements: None, normal Patient's awareness of abnormal movements (rate only patient's report): No Awareness, Dental Status Current problems with teeth and/or dentures?: No Does patient usually wear dentures?: No  CIWA:    COWS:     Musculoskeletal: Strength & Muscle Tone: within normal limits Gait & Station: normal Patient leans: N/A  Psychiatric Specialty Exam: Physical Exam Vitals and nursing note reviewed.  Constitutional:      Appearance: Normal appearance.  HENT:     Head: Normocephalic and atraumatic.  Pulmonary:     Effort: Pulmonary effort is normal.  Neurological:     General: No focal deficit present.     Mental Status: He is alert and oriented to person, place, and time.     Review of Systems  Blood pressure (!) 143/100, pulse 92, temperature 97.8 F (36.6 C), temperature source Oral, resp. rate 18, height 5\' 11"  (1.803 m), weight 72.1 kg, SpO2 95 %.Body mass index is 22.18 kg/m.  General Appearance: Casual  Eye Contact:  Fair  Speech:  Normal Rate  Volume:  Decreased  Mood:  Dysphoric  Affect:  Flat  Thought Process:  Goal  Directed and Descriptions of Associations: Circumstantial  Orientation:  Full (Time, Place, and Person)  Thought Content:  Delusions, Hallucinations: Auditory and Paranoid Ideation  Suicidal Thoughts:  No  Homicidal Thoughts:  No  Memory:  Immediate;   Fair Recent;   Fair Remote;   Fair  Judgement:  Intact  Insight:  Fair  Psychomotor Activity:  Normal  Concentration:  Concentration: Fair and Attention Span: Fair  Recall:  of Knowledge:  Fair  Language:  Fair  Akathisia:  Negative  Handed:  Right  AIMS (if indicated):     Assets:  Desire for Improvement Housing Resilience Social Support  ADL's:  Intact  Cognition:  WNL  Sleep:  Number of Hours: 8.25     Treatment Plan Summary: Daily contact with patient to assess and evaluate symptoms and progress in treatment, Medication management and Plan : Patient is seen and examined.  Patient is a 39 year old male with the above-stated past psychiatric history who is seen in follow-up.  Diagnosis: 1. Schizophrenia 2. Diabetes mellitus type 2   Pertinent findings on examination today: 1.  Patient denies auditory hallucinations, and the appearance of responding to internal stimuli does appear to be decreasing. 2.  Paranoia is decreasing. 3.  Hygiene is improved. 4.  Blood sugar this a.m. is 84.  Plan: 1. Continue Cogentin 0.5 mg p.o. twice daily for side effects of medication. 2. Continue Wellbutrin SR 150 mg p.o. daily for depression and anxiety. 3. Continue Pepcid 20 mg p.o. daily for GERD. 4. Continue lithium carbonate CR 450 mg p.o. twice daily for mood stability. 5. Continue Glucophage 850 mg p.o. twice daily with food for diabetes mellitus.  We may need to decrease his Glucophage dosage of his blood sugar remains below 90. 6. Increase Zyprexa Zydis to 20  mg p.o. nightly and 10 mg p.o. daily for psychosis. 7. Continue Zofran 4 mg p.o. every 8 hours as needed nausea and vomiting. 8. Continue daily blood  sugar checks. 9. Continue trazodone 100 mg p.o. nightly as needed insomnia. 10. Disposition planning-in progress. Antonieta PertGreg Lawson Elizabeth Paulsen, MD 11/30/2019, 10:17 AM

## 2019-11-30 NOTE — Progress Notes (Signed)
   11/30/19 1100  Psych Admission Type (Psych Patients Only)  Admission Status Involuntary  Psychosocial Assessment  Patient Complaints Suspiciousness  Eye Contact Fair  Facial Expression Flat  Affect Preoccupied  Speech Logical/coherent  Interaction Forwards little;Minimal  Motor Activity Pacing;Fidgety  Appearance/Hygiene Unremarkable  Behavior Characteristics Pacing  Mood Preoccupied  Thought Process  Coherency Concrete thinking;Disorganized  Content Preoccupation  Delusions None reported or observed  Perception Hallucinations  Hallucination None reported or observed  Judgment Impaired  Confusion None  Danger to Self  Current suicidal ideation? Denies  Danger to Others  Danger to Others None reported or observed

## 2019-12-01 DIAGNOSIS — F200 Paranoid schizophrenia: Secondary | ICD-10-CM | POA: Diagnosis not present

## 2019-12-01 LAB — COMPREHENSIVE METABOLIC PANEL
ALT: 20 U/L (ref 0–44)
AST: 16 U/L (ref 15–41)
Albumin: 4 g/dL (ref 3.5–5.0)
Alkaline Phosphatase: 56 U/L (ref 38–126)
Anion gap: 9 (ref 5–15)
BUN: 11 mg/dL (ref 6–20)
CO2: 23 mmol/L (ref 22–32)
Calcium: 9.9 mg/dL (ref 8.9–10.3)
Chloride: 107 mmol/L (ref 98–111)
Creatinine, Ser: 0.93 mg/dL (ref 0.61–1.24)
GFR, Estimated: >60 mL/min (ref >60)
Glucose, Bld: 140 mg/dL — ABNORMAL HIGH (ref 70–99)
Potassium: 3.7 mmol/L (ref 3.5–5.1)
Sodium: 139 mmol/L (ref 135–145)
Total Bilirubin: 0.4 mg/dL (ref 0.3–1.2)
Total Protein: 6.8 g/dL (ref 6.5–8.1)

## 2019-12-01 LAB — LITHIUM LEVEL: Lithium Lvl: 0.53 mmol/L — ABNORMAL LOW (ref 0.60–1.20)

## 2019-12-01 LAB — TSH: TSH: 0.66 u[IU]/mL (ref 0.350–4.500)

## 2019-12-01 MED ORDER — METFORMIN HCL 500 MG PO TABS
500.0000 mg | ORAL_TABLET | Freq: Two times a day (BID) | ORAL | Status: DC
  Administered 2019-12-01 – 2019-12-02 (×3): 500 mg via ORAL
  Filled 2019-12-01 (×6): qty 1

## 2019-12-01 NOTE — Progress Notes (Signed)
   12/01/19 3845  Psych Admission Type (Psych Patients Only)  Admission Status Involuntary  Psychosocial Assessment  Patient Complaints Suspiciousness  Eye Contact Fair  Facial Expression Flat  Affect Preoccupied  Speech Logical/coherent  Interaction Forwards little;Minimal  Motor Activity Pacing;Fidgety  Appearance/Hygiene Unremarkable  Behavior Characteristics Cooperative;Pacing  Mood Preoccupied  Thought Process  Coherency Concrete thinking;Disorganized  Content Preoccupation  Delusions None reported or observed  Perception Hallucinations  Hallucination None reported or observed  Judgment Impaired  Confusion None  Danger to Self  Current suicidal ideation? Denies  Danger to Others  Danger to Others None reported or observed   Pt slept the entire shift and wasn't seen until VS check this morning. Pt still has flat affect and is preoccupied. Pt has resumed pacing this morning and mumbling to himself. Pt denies SI, HI, AVH and pain.

## 2019-12-01 NOTE — Progress Notes (Signed)
[facility] MD Progress Note  12/01/2019 11:46 AM James [name]  MRN:  [id number] Subjective:  Patient is a 39 year old male with a past psychiatric history significant for schizophrenia who presented on 11/23/2019 secondary to noncompliance of medications. He apparently had been getting into fights with his father, talking to himself and laughing to himself and appearing to respond to internal stimuli.  Objective: Patient is seen and examined.  Patient is a 39 year old male with the above-stated past psychiatric history who is seen in follow-up.  We had hoped to be able to discharge him today, but his mother felt as though he was not back to his baseline.  This morning he is about the same as he was yesterday.  He is able to smile and engage today.  He denied any auditory or visual hallucinations.  He denied any suicidal or homicidal ideation.  He is not pacing the hallway as much as he had.  His sleep remains good, and he denied any side effects to his current medications.  His blood sugar did remain in the 80s this morning.  It was 84.  We will have to reduce his Metformin dosage.  His vital signs are stable, he is afebrile.  His laboratories with regard to his lithium dosage showed his electrolytes to be essentially normal.  His creatinine was 0.93.  Sodium and potassium were both normal.  Liver function enzymes were normal.  His lithium level is still pending.  TSH was 0.660.  Principal Problem: <principal problem not specified> Diagnosis: Active Problems:   Schizophrenia (HCC)  Total Time spent with patient: 15 minutes  Past Psychiatric History: See admission H&P  Past Medical History:  Past Medical History:  Diagnosis Date  . Gastroesophageal reflux disease 11/25/2017   History reviewed. No pertinent surgical history. Family History:  Family History  Problem Relation Age of Onset  . Diabetes Mother   . Diabetes Father   . Prostate cancer Father    Family Psychiatric  History: See  admission H&P Social History:  Social History   Substance and Sexual Activity  Alcohol Use No  . Alcohol/week: 0.0 standard drinks   Comment: rare alcohol     Social History   Substance and Sexual Activity  Drug Use No    Social History   Socioeconomic History  . Marital status: Single    Spouse name: Not on file  . Number of children: Not on file  . Years of education: Not on file  . Highest education level: Not on file  Occupational History  . Not on file  Tobacco Use  . Smoking status: Current Every Day Smoker    Packs/day: 0.50    Types: Cigarettes  . Smokeless tobacco: Never Used  Substance and Sexual Activity  . Alcohol use: No    Alcohol/week: 0.0 standard drinks    Comment: rare alcohol  . Drug use: No  . Sexual activity: Yes    Comment: male  Other Topics Concern  . Not on file  Social History Narrative  . Not on file   Social Determinants of Health   Financial Resource Strain:   . Difficulty of Paying Living Expenses: Not on file  Food Insecurity:   . Worried About Programme researcher, broadcasting/film/video in the Last Year: Not on file  . Ran Out of Food in the Last Year: Not on file  Transportation Needs:   . Lack of Transportation (Medical): Not on file  . Lack of Transportation (Non-Medical): Not on file  Physical Activity:   . Days of Exercise per Week: Not on file  . Minutes of Exercise per Session: Not on file  Stress:   . Feeling of Stress : Not on file  Social Connections:   . Frequency of Communication with Friends and Family: Not on file  . Frequency of Social Gatherings with Friends and Family: Not on file  . Attends Religious Services: Not on file  . Active Member of Clubs or Organizations: Not on file  . Attends Banker Meetings: Not on file  . Marital Status: Not on file   Additional Social History:                         Sleep: Good  Appetite:  Fair  Current Medications: Current Facility-Administered Medications   Medication Dose Route Frequency Provider Last Rate Last Admin  . acetaminophen (TYLENOL) tablet 650 mg  650 mg Oral Q4H PRN Denzil Magnuson, NP      . alum & mag hydroxide-simeth (MAALOX/MYLANTA) 200-200-20 MG/5ML suspension 30 mL  30 mL Oral Q6H PRN Denzil Magnuson, NP      . benztropine (COGENTIN) tablet 0.5 mg  0.5 mg Oral BID Denzil Magnuson, NP   0.5 mg at 12/01/19 0802  . buPROPion (WELLBUTRIN SR) 12 hr tablet 150 mg  150 mg Oral Daily Cristofano, Worthy Rancher, MD   150 mg at 12/01/19 0802  . famotidine (PEPCID) tablet 20 mg  20 mg Oral Daily Denzil Magnuson, NP   20 mg at 12/01/19 0803  . lithium carbonate (ESKALITH) CR tablet 450 mg  450 mg Oral BID Denzil Magnuson, NP   450 mg at 12/01/19 0802  . metFORMIN (GLUCOPHAGE) tablet 500 mg  500 mg Oral BID WC Antonieta Pert, MD   500 mg at 12/01/19 0805  . nicotine (NICODERM CQ - dosed in mg/24 hours) patch 14 mg  14 mg Transdermal Daily Denzil Magnuson, NP      . OLANZapine zydis (ZYPREXA) disintegrating tablet 10 mg  10 mg Oral Daily Antonieta Pert, MD   10 mg at 12/01/19 0802  . OLANZapine zydis (ZYPREXA) disintegrating tablet 20 mg  20 mg Oral QHS Antonieta Pert, MD   20 mg at 11/29/19 2030  . ondansetron (ZOFRAN) tablet 4 mg  4 mg Oral Q8H PRN Denzil Magnuson, NP      . traZODone (DESYREL) tablet 100 mg  100 mg Oral QHS PRN Nira Conn A, NP   100 mg at 11/29/19 2029  . zolpidem (AMBIEN) tablet 5 mg  5 mg Oral QHS Cristofano, Worthy Rancher, MD   5 mg at 11/29/19 2029    Lab Results:  Results for orders placed or performed during the hospital encounter of 11/22/19 (from the past 48 hour(s))  Glucose, capillary     Status: None   Collection Time: 11/30/19  5:45 AM  Result Value Ref Range   Glucose-Capillary 84 70 - 99 mg/dL    Comment: Glucose reference range applies only to samples taken after fasting for at least 8 hours.  Comprehensive metabolic panel     Status: Abnormal   Collection Time: 12/01/19  6:30 AM  Result Value  Ref Range   Sodium 139 135 - 145 mmol/L   Potassium 3.7 3.5 - 5.1 mmol/L   Chloride 107 98 - 111 mmol/L   CO2 23 22 - 32 mmol/L   Glucose, Bld 140 (H) 70 - 99 mg/dL    Comment: Glucose  reference range applies only to samples taken after fasting for at least 8 hours.   BUN 11 6 - 20 mg/dL   Creatinine, Ser 4.090.93 0.61 - 1.24 mg/dL   Calcium 9.9 8.9 - 81.110.3 mg/dL   Total Protein 6.8 6.5 - 8.1 g/dL   Albumin 4.0 3.5 - 5.0 g/dL   AST 16 15 - 41 U/L   ALT 20 0 - 44 U/L   Alkaline Phosphatase 56 38 - 126 U/L   Total Bilirubin 0.4 0.3 - 1.2 mg/dL   GFR, Estimated >91>60 >47>60 mL/min    Comment: (NOTE) Calculated using the CKD-EPI Creatinine Equation (2021)    Anion gap 9 5 - 15    Comment: Performed at [facility], [address] [place], [place] [address]  TSH     Status: None   Collection Time: 12/01/19  6:30 AM  Result Value Ref Range   TSH 0.660 0.350 - 4.500 uIU/mL    Comment: Performed by a 3rd Generation assay with a functional sensitivity of <=0.01 uIU/mL. Performed at [facility], [address] [place], [place] [address]     Blood Alcohol level:  Lab Results  Component Value Date   ETH <10 11/20/2019   ETH <10 06/27/2018    Metabolic Disorder Labs: Lab Results  Component Value Date   HGBA1C 5.8 (H) 11/23/2019   MPG 120 11/23/2019   MPG 111.15 06/27/2018   Lab Results  Component Value Date   PROLACTIN 3.3 (L) 11/23/2019   Lab Results  Component Value Date   CHOL 153 11/23/2019   TRIG 46 11/23/2019   HDL 58 11/23/2019   CHOLHDL 2.6 11/23/2019   VLDL 9 11/23/2019   LDLCALC 86 11/23/2019   LDLCALC 72 06/27/2018    Physical Findings: AIMS: Facial and Oral Movements Muscles of Facial Expression: None, normal Lips and Perioral Area: None, normal Jaw: None, normal Tongue: None, normal,Extremity Movements Upper (arms, wrists, hands, fingers): None, normal Lower (legs, knees, ankles, toes): None, normal, Trunk  Movements Neck, shoulders, hips: None, normal, Overall Severity Severity of abnormal movements (highest score from questions above): None, normal Incapacitation due to abnormal movements: None, normal Patient's awareness of abnormal movements (rate only patient's report): No Awareness, Dental Status Current problems with teeth and/or dentures?: No Does patient usually wear dentures?: No  CIWA:    COWS:     Musculoskeletal: Strength & Muscle Tone: within normal limits Gait & Station: normal Patient leans: N/A  Psychiatric Specialty Exam: Physical Exam Vitals and nursing note reviewed.  Constitutional:      Appearance: Normal appearance.  HENT:     Head: Normocephalic.  Pulmonary:     Effort: Pulmonary effort is normal.  Neurological:     General: No focal deficit present.     Mental Status: He is alert and oriented to person, place, and time.     Review of Systems  Blood pressure 108/87, pulse 87, temperature (!) 97.3 F (36.3 C), temperature source Oral, resp. rate 18, height 5\' 11"  (1.803 m), weight 72.1 kg, SpO2 97 %.Body mass index is 22.18 kg/m.  General Appearance: Casual  Eye Contact:  Fair  Speech:  Normal Rate  Volume:  Decreased  Mood:  Euthymic  Affect:  Flat  Thought Process:  Coherent and Descriptions of Associations: Intact  Orientation:  Full (Time, Place, and Person)  Thought Content:  Negative  Suicidal Thoughts:  No  Homicidal Thoughts:  No  Memory:  Immediate;   Fair Recent;  Fair Remote;   Fair  Judgement:  Intact  Insight:  Fair  Psychomotor Activity:  Normal  Concentration:  Concentration: Fair and Attention Span: Fair  Recall:  [employer] of Knowledge:  Fair  Language:  Good  Akathisia:  Negative  Handed:  Right  AIMS (if indicated):     Assets:  Desire for Improvement Housing Resilience Social Support  ADL's:  Intact  Cognition:  WNL  Sleep:  Number of Hours: 9.5     Treatment Plan Summary: Daily contact with patient to  assess and evaluate symptoms and progress in treatment, Medication management and Plan : Patient is seen and examined.  Patient is a 39 year old male with the above-stated past psychiatric history who is seen in follow-up.  Diagnosis: 1. Schizophrenia 2. Diabetes mellitus type 2   Pertinent findings on examination today: 1.  Denies auditory or visual hallucinations, less evidence of internal stimuli.  Denied suicidal or homicidal ideation. 2.  Blood sugar this morning is still in the 80s. 3.  Electrolytes are normal, TSH is normal, lithium level is pending.  Plan: 1. Continue Cogentin 0.5 mg p.o. twice daily for side effects of medication. 2. Continue Wellbutrin SR 150 mg p.o. daily for depression and anxiety. 3. Continue Pepcid 20 mg p.o. daily for GERD. 4. Continue lithium carbonate CR 450 mg p.o. twice daily for mood stability. 5. DecreaseGlucophage to 500mg  p.o. twice daily with food for diabetes mellitus.  We may need to decrease his Glucophage dosage of his blood sugar remains below 90. 6.  Continue Zyprexa Zydis to20mg  p.o. nightly and 10mg  p.o. daily for psychosis. 7. Continue Zofran 4 mg p.o. every 8 hours as needed nausea and vomiting. 8. Continue daily blood sugar checks. 9. Continue trazodone 100 mg p.o. nightly as needed insomnia. 10. Disposition planning-plan for discharge tomorrow. , MD 12/01/2019, 11:46 AM

## 2019-12-01 NOTE — BHH Counselor (Signed)
CSW spoke with the mother and guardian, Haig Gerardo [phone] who states that she is fine with her son coming home and that she wants him to have outpatient appointments.  CSW explained that Jarrel will have appointments with [facility] when he is discharged for medication management and therapy.

## 2019-12-01 NOTE — Progress Notes (Signed)
Patient denies SI, HI and AVH this shift.  Patient has been noted to be pacing the hall and periodically responding to internal stimuli. Patient has had no incidents of behavioral dyscontrol this shift.   Assess patient for safety, offer medications as prescribed, engage patient in 1:1 staff talks.    Patient able to contract for safety.  Continue to monitor as planned.

## 2019-12-01 NOTE — Progress Notes (Signed)
   12/01/19 2000  Psych Admission Type (Psych Patients Only)  Admission Status Other (Comment)  Psychosocial Assessment  Patient Complaints Restlessness;Suspiciousness  Eye Contact Fair  Facial Expression Flat  Affect Preoccupied  Speech Logical/coherent  Interaction Minimal  Motor Activity Pacing  Appearance/Hygiene Unremarkable  Behavior Characteristics Cooperative;Pacing  Mood Preoccupied;Suspicious  Thought Process  Coherency Concrete thinking  Content Preoccupation  Delusions None reported or observed  Perception Hallucinations  Hallucination None reported or observed  Judgment Impaired  Confusion None  Danger to Self  Current suicidal ideation? Denies  Danger to Others  Danger to Others None reported or observed

## 2019-12-02 DIAGNOSIS — F200 Paranoid schizophrenia: Secondary | ICD-10-CM | POA: Diagnosis not present

## 2019-12-02 LAB — GLUCOSE, CAPILLARY: Glucose-Capillary: 84 mg/dL (ref 70–99)

## 2019-12-02 MED ORDER — OLANZAPINE 10 MG PO TABS
10.0000 mg | ORAL_TABLET | Freq: Every day | ORAL | 0 refills | Status: DC
Start: 2019-12-02 — End: 2020-07-28

## 2019-12-02 MED ORDER — OLANZAPINE 20 MG PO TABS: 20.0000 mg | ORAL_TABLET | Freq: Every day | ORAL | 0 refills | Status: DC

## 2019-12-02 MED ORDER — METFORMIN HCL 500 MG PO TABS: 500.0000 mg | ORAL_TABLET | Freq: Two times a day (BID) | ORAL | 0 refills | Status: DC

## 2019-12-02 MED ORDER — BUPROPION HCL ER (SR) 150 MG PO TB12: 150.0000 mg | ORAL_TABLET | Freq: Every day | ORAL | 0 refills | Status: DC

## 2019-12-02 MED ORDER — OLANZAPINE 10 MG PO TABS
10.0000 mg | ORAL_TABLET | Freq: Every day | ORAL | Status: DC
  Filled 2019-12-02 (×2): qty 1

## 2019-12-02 MED ORDER — OLANZAPINE 10 MG PO TABS
20.0000 mg | ORAL_TABLET | Freq: Every day | ORAL | Status: DC
  Filled 2019-12-02: qty 2

## 2019-12-02 MED ORDER — LITHIUM CARBONATE ER 450 MG PO TBCR: 450.0000 mg | EXTENDED_RELEASE_TABLET | Freq: Two times a day (BID) | ORAL | 0 refills | Status: DC

## 2019-12-02 NOTE — Plan of Care (Signed)
  Problem: Education: Goal: Ability to state activities that reduce stress will improve Outcome: Adequate for Discharge   Problem: Coping: Goal: Ability to identify and develop effective coping behavior will improve Outcome: Adequate for Discharge   Problem: Self-Concept: Goal: Ability to identify factors that promote anxiety will improve Outcome: Adequate for Discharge Goal: Level of anxiety will decrease Outcome: Adequate for Discharge Goal: Ability to modify response to factors that promote anxiety will improve Outcome: Adequate for Discharge   Problem: Education: Goal: Knowledge of [facility] Education information/materials will improve Outcome: Adequate for Discharge Goal: Emotional status will improve Outcome: Adequate for Discharge Goal: Mental status will improve Outcome: Adequate for Discharge Goal: Verbalization of understanding the information provided will improve Outcome: Adequate for Discharge   Problem: Activity: Goal: Interest or engagement in activities will improve Outcome: Adequate for Discharge Goal: Sleeping patterns will improve Outcome: Adequate for Discharge   Problem: Coping: Goal: Ability to verbalize frustrations and anger appropriately will improve Outcome: Adequate for Discharge Goal: Ability to demonstrate self-control will improve Outcome: Adequate for Discharge   Problem: Health Behavior/Discharge Planning: Goal: Identification of resources available to assist in meeting health care needs will improve Outcome: Adequate for Discharge Goal: Compliance with treatment plan for underlying cause of condition will improve Outcome: Adequate for Discharge   Problem: Physical Regulation: Goal: Ability to maintain clinical measurements within normal limits will improve Outcome: Adequate for Discharge   Problem: Safety: Goal: Periods of time without injury will increase Outcome: Adequate for Discharge   Problem: Activity: Goal: Will  verbalize the importance of balancing activity with adequate rest periods Outcome: Adequate for Discharge   Problem: Education: Goal: Will be free of psychotic symptoms Outcome: Adequate for Discharge Goal: Knowledge of the prescribed therapeutic regimen will improve Outcome: Adequate for Discharge   Problem: Coping: Goal: Coping ability will improve Outcome: Adequate for Discharge Goal: Will verbalize feelings Outcome: Adequate for Discharge   Problem: Health Behavior/Discharge Planning: Goal: Compliance with prescribed medication regimen will improve Outcome: Adequate for Discharge   Problem: Nutritional: Goal: Ability to achieve adequate nutritional intake will improve Outcome: Adequate for Discharge   Problem: Role Relationship: Goal: Ability to communicate needs accurately will improve Outcome: Adequate for Discharge Goal: Ability to interact with others will improve Outcome: Adequate for Discharge   Problem: Safety: Goal: Ability to redirect hostility and anger into socially appropriate behaviors will improve Outcome: Adequate for Discharge Goal: Ability to remain free from injury will improve Outcome: Adequate for Discharge   Problem: Self-Care: Goal: Ability to participate in self-care as condition permits will improve Outcome: Adequate for Discharge   Problem: Self-Concept: Goal: Will verbalize positive feelings about self Outcome: Adequate for Discharge  Patient discharged.

## 2019-12-02 NOTE — BHH Suicide Risk Assessment (Signed)
[facility] Discharge Suicide Risk Assessment   Principal Problem: <principal problem not specified> Discharge Diagnoses: Active Problems:   Schizophrenia (HCC)   Total Time spent with patient: 20 minutes  Musculoskeletal: Strength & Muscle Tone: within normal limits Gait & Station: normal Patient leans: N/A  Psychiatric Specialty Exam: Review of Systems  All other systems reviewed and are negative.   Blood pressure 108/87, pulse 87, temperature (!) 97.3 F (36.3 C), temperature source Oral, resp. rate 18, height 5\' 11"  (1.803 m), weight 72.1 kg, SpO2 97 %.Body mass index is 22.18 kg/m.  General Appearance: Casual  Eye Contact::  Fair  Speech:  Normal Rate409  Volume:  Decreased  Mood:  Euthymic  Affect:  Flat  Thought Process:  Coherent and Descriptions of Associations: Intact  Orientation:  Full (Time, Place, and Person)  Thought Content:  Logical  Suicidal Thoughts:  No  Homicidal Thoughts:  No  Memory:  Immediate;   Fair Recent;   Fair Remote;   Fair  Judgement:  Intact  Insight:  Fair  Psychomotor Activity:  Normal  Concentration:  Fair  Recall:  [id number] of Knowledge:Fair  Language: Fair  Akathisia:  Negative  Handed:  Right  AIMS (if indicated):     Assets:  Desire for Improvement Housing Resilience Social Support  Sleep:  Number of Hours: 9.5  Cognition: WNL  ADL's:  Intact   Mental Status Per Nursing Assessment::   On Admission:  NA  Demographic Factors:  Male and Unemployed  Loss Factors: NA  Historical Factors: Impulsivity  Risk Reduction Factors:   Living with another person, especially a relative and Positive social support  Continued Clinical Symptoms:  Schizophrenia:   Less than 55 years old Paranoid or undifferentiated type  Cognitive Features That Contribute To Risk:  None    Suicide Risk:  Minimal: No identifiable suicidal ideation.  Patients presenting with no risk factors but with morbid ruminations; may be classified as  minimal risk based on the severity of the depressive symptoms   Follow-up Information    [facility] Follow up.   Specialty: Behavioral Health Why: You may go to this provider as a walk in patient if you decide that you would like to receive therapy or medication management services.   Contact information: [phone] [place] [phone]              Plan Of Care/Follow-up recommendations:  Activity:  ad lib  [phone], MD 12/02/2019, 7:36 AM

## 2019-12-02 NOTE — Discharge Summary (Signed)
Physician Discharge Summary Note  Patient:  James [name] is an 39 y.o., male MRN:  [id number] DOB:  April 26, 1980 Patient phone:  [phone] (home)  Patient address:   [address] [place] [address],  Total Time spent with patient: 30 minutes  Date of Admission:  11/22/2019 Date of Discharge: 12/01/2019  Reason for Admission: Psychosis  Principal Problem: <principal problem not specified> Discharge Diagnoses: Active Problems:   Schizophren[facility])   Past Psychiatric History: See admission H&P  Past Medical History:  Past Medical History:  Diagnosis Date  . Gastroesophageal reflux disease 11/25/2017   History reviewed. No pertinent surgical history. Family History:  Family History  Problem Relation Age of Onset  . Diabetes Mother   . Diabetes Father   . Prostate cancer Father    Family Psychiatric  History: See admission H&P Social History:  Social History   Substance and Sexual Activity  Alcohol Use No  . Alcohol/week: 0.0 standard drinks   Comment: rare alcohol     Social History   Substance and Sexual Activity  Drug Use No    Social History   Socioeconomic History  . Marital status: Single    Spouse name: Not on file  . Number of children: Not on file  . Years of education: Not on file  . Highest education level: Not on file  Occupational History  . Not on file  Tobacco Use  . Smoking status: Current Every Day Smoker    Packs/day: 0.50    Types: Cigarettes  . Smokeless tobacco: Never Used  Substance and Sexual Activity  . Alcohol use: No    Alcohol/week: 0.0 standard drinks    Comment: rare alcohol  . Drug use: No  . Sexual activity: Yes    Comment: male  Other Topics Concern  . Not on file  Social History Narrative  . Not on file   Social Determinants of Health   Financial Resource Strain:   . Difficulty of Paying Living Expenses: Not on file  Food Insecurity:   . Worried About Programme researcher, broadcasting/film/video in the Last Year: Not  on file  . Ran Out of Food in the Last Year: Not on file  Transportation Needs:   . Lack of Transportation (Medical): Not on file  . Lack of Transportation (Non-Medical): Not on file  Physical Activity:   . Days of Exercise per Week: Not on file  . Minutes of Exercise per Session: Not on file  Stress:   . Feeling of Stress : Not on file  Social Connections:   . Frequency of Communication with Friends and Family: Not on file  . Frequency of Social Gatherings with Friends and Family: Not on file  . Attends Religious Services: Not on file  . Active Member of Clubs or Organizations: Not on file  . Attends Banker Meetings: Not on file  . Marital Status: Not on file    Hospital Course: Patient is a 39 year old male with a known past psychiatric history significant for schizophrenia who was involuntarily committed by his family secondary to not taking his medications, getting into fights with his father, responding to internal stimuli and what appears to be auditory hallucinations.  He was admitted to our facility on 11/23/2019.  On admission he was noted to have a flat affect.  He did laugh and talk to himself throughout the interview.  He had been taking aripiprazole, Cogentin, bupropion, Pepcid, lithium and Metformin prior to admission.  These were  restarted.  I first saw the patient on 11/27/2019.  At that time he rated his depression a 9 out of 10 with 10 being the worst, anxiety 7 out of 10, and hopelessness 8 out of 10.  His Abilify had been increased to 20 mg p.o. daily.  Review of the electronic medical record revealed that he had been successfully treated and done well on olanzapine in the past.  The Abilify was stopped and he was placed on olanzapine 15 mg p.o. nightly.  His hemoglobin A1c was also 5.8.  His lithium level on admission was 0.29.  Also on the Abilify his sleep was not good.  On 10/24 he appeared to be essentially unchanged.  He was still responding to internal  stimuli.  His hygiene was poor.  He had not showered throughout the course of the hospitalization.  He denied auditory or visual hallucinations, but both staff and myself saw the patient in the hallway clearly responding to internal stimuli.  The addition of the Zyprexa did assist in sleep.  He slept 6.75 hours on 11/28/2019.  A repeat lithium level on 10/23 was 0.56.  His blood sugar was also elevated and we increased his Glucophage to 850 mg p.o. twice daily for his diabetes mellitus.  His Zyprexa dosage was increased to 15 mg p.o. nightly and 5 mg p.o. daily.  He began to show some improvement on 11/29/2019.  He was less disorganized, had less episodes of auditory hallucinations and responding to internal stimuli.  Further review of the electronic medical record revealed that he had been previously treated with Clozaril, Latuda, Abilify, lithium as well as Zyprexa.  He apparently has some form of an allergy to Clozaril.  On 10/25 his Zyprexa dosage was increased to 20 mg p.o. nightly and 10 mg p.o. daily.  On 10/26 he began to show some improvement.  There were less episodes of auditory hallucinations and internal stimuli.  His blood sugar was slightly low.  His vital signs remained stable.  He was less disorganized and actually was able to smile and engage to a degree.  His sleep improved significantly and on 10/26 had slept 8.25 hours.  His dosage of Zyprexa was continued at this point.  His blood sugar did go down into the upper 80s, and the decision was made to monitor this in case we had to decrease his Glucophage dosage again.  On 10/26 his blood sugar remained low, and his Glucophage dosage was returned to 500 mg p.o. twice daily.  Throughout the rest the course of hospitalization he did well with the Zyprexa.  Repeat lithium level on the date of discharge was approximately 0.56.  He was not grossly psychotic, suicidal or homicidal.  Social work discussed return to the home of his mother and father at  discharge and they were in agreement with the plan.  The decision was made that he could be discharged on 12/02/2019.  Physical Findings: AIMS: Facial and Oral Movements Muscles of Facial Expression: None, normal Lips and Perioral Area: None, normal Jaw: None, normal Tongue: None, normal,Extremity Movements Upper (arms, wrists, hands, fingers): None, normal Lower (legs, knees, ankles, toes): None, normal, Trunk Movements Neck, shoulders, hips: None, normal, Overall Severity Severity of abnormal movements (highest score from questions above): None, normal Incapacitation due to abnormal movements: None, normal Patient's awareness of abnormal movements (rate only patient's report): No Awareness, Dental Status Current problems with teeth and/or dentures?: No Does patient usually wear dentures?: No  CIWA:  COWS:     Musculoskeletal: Strength & Muscle Tone: within normal limits Gait & Station: normal Patient leans: N/A  Psychiatric Specialty Exam: Physical Exam Vitals and nursing note reviewed.  Constitutional:      Appearance: Normal appearance.  HENT:     Head: Normocephalic and atraumatic.  Pulmonary:     Effort: Pulmonary effort is normal.  Neurological:     General: No focal deficit present.     Mental Status: He is alert and oriented to person, place, and time.     Review of Systems  Blood pressure 108/87, pulse 87, temperature (!) 97.3 F (36.3 C), temperature source Oral, resp. rate 18, height 5\' 11"  (1.803 m), weight 72.1 kg, SpO2 97 %.Body mass index is 22.18 kg/m.  General Appearance: Casual  Eye Contact:  Fair  Speech:  Normal Rate  Volume:  Decreased  Mood:  Euthymic  Affect:  Flat  Thought Process:  Goal Directed and Descriptions of Associations: Intact  Orientation:  Full (Time, Place, and Person)  Thought Content:  Delusions  Suicidal Thoughts:  No  Homicidal Thoughts:  No  Memory:  Immediate;   Fair Recent;   Fair Remote;   Fair  Judgement:  Intact   Insight:  Fair  Psychomotor Activity:  Decreased  Concentration:  Concentration: Fair and Attention Span: Fair  Recall:  [employer]Fair  Fund of Knowledge:  Fair  Language:  Fair  Akathisia:  Negative  Handed:  Right  AIMS (if indicated):     Assets:  Desire for Improvement Housing Resilience Social Support  ADL's:  Intact  Cognition:  WNL  Sleep:  Number of Hours: 9.5     Have you used any form of tobacco in the last 30 days? (Cigarettes, Smokeless Tobacco, Cigars, and/or Pipes): No  Has this patient used any form of tobacco in the last 30 days? (Cigarettes, Smokeless Tobacco, Cigars, and/or Pipes) Yes, No  Blood Alcohol level:  Lab Results  Component Value Date   ETH <10 11/20/2019   ETH <10 06/27/2018    Metabolic Disorder Labs:  Lab Results  Component Value Date   HGBA1C 5.8 (H) 11/23/2019   MPG 120 11/23/2019   MPG 111.15 06/27/2018   Lab Results  Component Value Date   PROLACTIN 3.3 (L) 11/23/2019   Lab Results  Component Value Date   CHOL 153 11/23/2019   TRIG 46 11/23/2019   HDL 58 11/23/2019   CHOLHDL 2.6 11/23/2019   VLDL 9 11/23/2019   LDLCALC 86 11/23/2019   LDLCALC 72 06/27/2018    See Psychiatric Specialty Exam and Suicide Risk Assessment completed by Attending Physician prior to discharge.  Discharge destination:  Home  Is patient on multiple antipsychotic therapies at discharge:  No   Has Patient had three or more failed trials of antipsychotic monotherapy by history:  No  Recommended Plan for Multiple Antipsychotic Therapies: NA  Discharge Instructions    Diet - low sodium heart healthy   Complete by: As directed    Increase activity slowly   Complete by: As directed    Increase activity slowly   Complete by: As directed      Allergies as of 12/02/2019      Reactions   Clozapine Other (See Comments)   Reaction unknown   Mango Flavor    Mango; [place]: Patient has no known reaction to report. Mom states he just don't like.       Medication List    STOP taking these medications   ARIPiprazole  20 MG tablet Commonly known as: ABILIFY   buPROPion 150 MG 12 hr tablet Commonly known as: ZYBAN Replaced by: buPROPion 150 MG 12 hr tablet     TAKE these medications     Indication  benztropine 0.5 MG tablet Commonly known as: COGENTIN Take 0.5 mg by mouth 2 (two) times daily.  Indication: Extrapyramidal Reaction caused by Medications   buPROPion 150 MG 12 hr tablet Commonly known as: WELLBUTRIN SR Take 1 tablet (150 mg total) by mouth daily. Start taking on: December 03, 2019 Replaces: buPROPion 150 MG 12 hr tablet  Indication: Major Depressive Disorder   famotidine 20 MG tablet Commonly known as: PEPCID Take 1 tablet (20 mg total) by mouth daily.  Indication: Gastroesophageal Reflux Disease   lithium carbonate 450 MG CR tablet Commonly known as: ESKALITH Take 1 tablet (450 mg total) by mouth 2 (two) times daily.  Indication: Schizoaffective Disorder   metFORMIN 500 MG tablet Commonly known as: GLUCOPHAGE Take 1 tablet (500 mg total) by mouth 2 (two) times daily with a meal.  Indication: Type 2 Diabetes   OLANZapine 10 MG tablet Commonly known as: ZYPREXA Take 1 tablet (10 mg total) by mouth daily.  Indication: Depressive Phase of Manic-Depression   OLANZapine 20 MG tablet Commonly known as: ZYPREXA Take 1 tablet (20 mg total) by mouth at bedtime.  Indication: Schizophrenia       Follow-up Information    [facility] Follow up.   Specialty: Behavioral Health Why: You may go to this provider as a walk in patient if you decide that you would like to receive therapy or medication management services.   Contact information: [address] [place] [address] [phone]              Follow-up recommendations:  Activity:  ad lib  Comments: Take medications as directed.  Follow-up with psychiatry as arranged by social work.  Monitor blood sugar.  If  psychotic symptoms return contact your psychiatrist or return to the behavioral health urgent care center.  Signed: Antonieta Pert, MD 12/02/2019, 11:07 AM

## 2019-12-02 NOTE — Progress Notes (Signed)
°  [facility] Adult Case Management Discharge Plan :  Will you be returning to the same living situation after discharge:  Yes,  to home At discharge, do you have transportation home?: Yes,  mother to pick this patient up Do you have the ability to pay for your medications: Yes,  has insurance  Release of information consent forms completed and in the chart;  Patient's signature needed at discharge.  Patient to Follow up at:  Follow-up Information    [facility] Follow up.   Specialty: Behavioral Health Why: You may go to this provider as a walk in patient if you decide that you would like to receive therapy or medication management services.   Contact information: [address] [place] [address] [phone]              Next level of care provider has access to [facility] Link:no  Safety Planning and Suicide Prevention discussed: Yes,  with mother  Have you used any form of tobacco in the last 30 days? (Cigarettes, Smokeless Tobacco, Cigars, and/or Pipes): No  Has patient been referred to the Quitline?: Patient refused referral  Patient has been referred for addiction treatment: Pt. refused referral  Otelia Santee, LCSW 12/02/2019, 9:01 AM

## 2019-12-13 IMAGING — US US ABDOMEN COMPLETE
1 series · 14 of 25 positions shown · non-contrast
Comparison: None.

CLINICAL DATA: Upper abdominal region pain

EXAM:
ABDOMEN ULTRASOUND COMPLETE

[Series 1: us abdomen complete · 0.13mm/px · 14 of 104 slices shown]
[im 1/104]
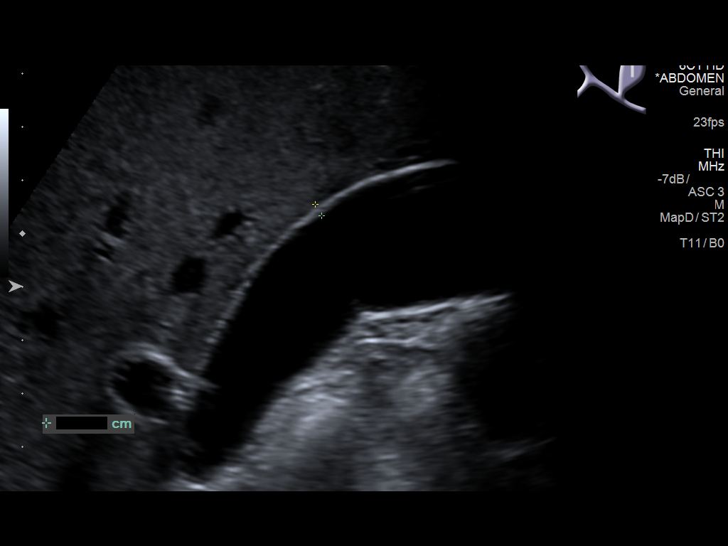
[im 9/104]
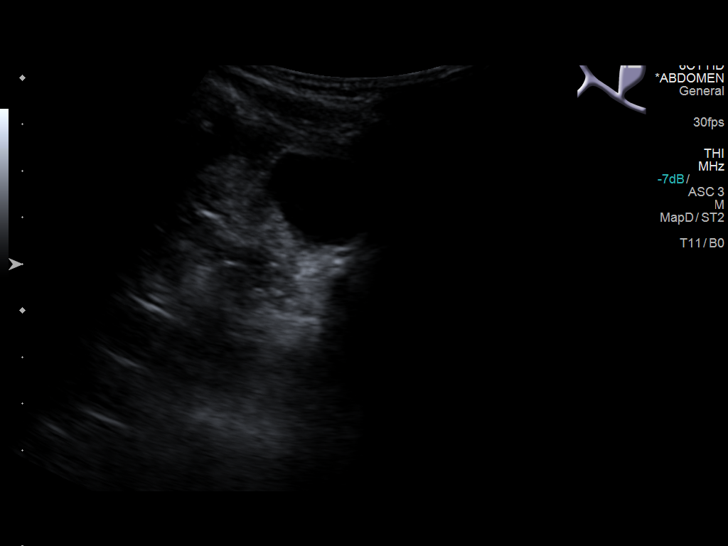
[im 18/104]
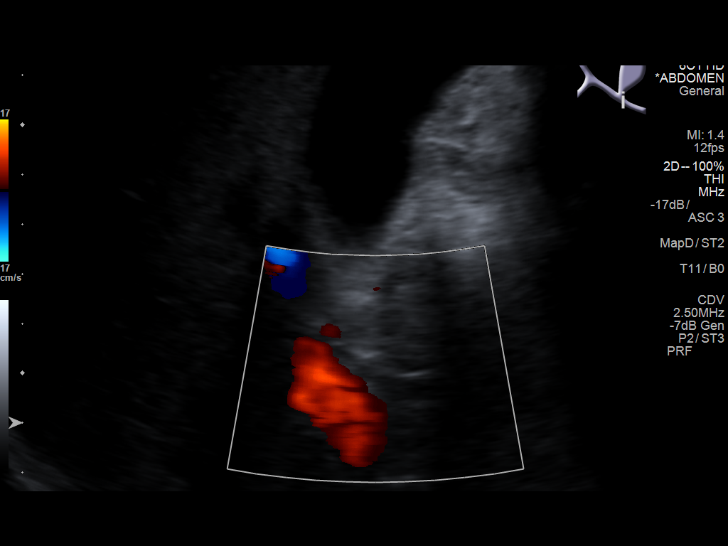
[im 26/104]
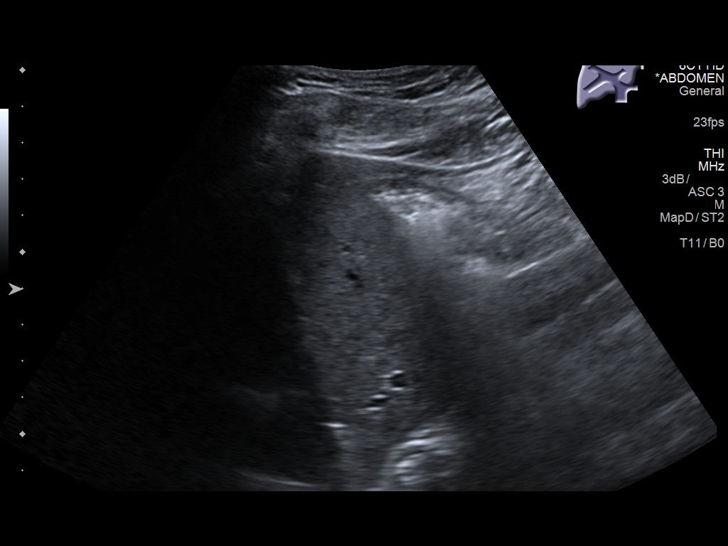
[im 35/104]
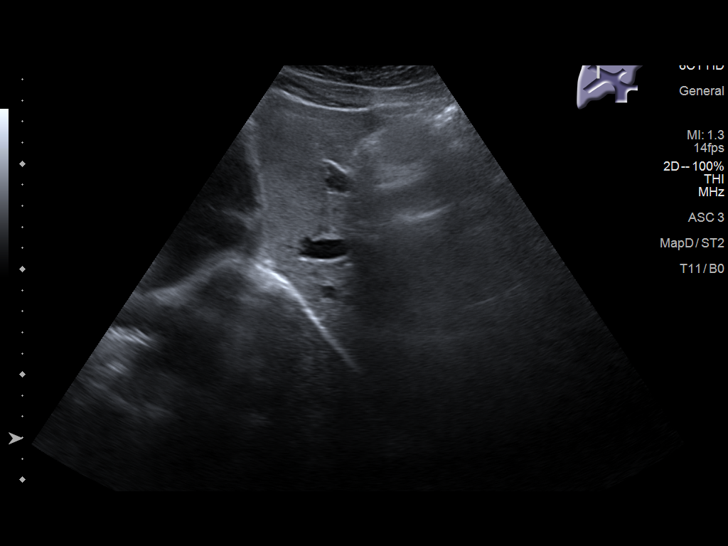
[im 39/104]
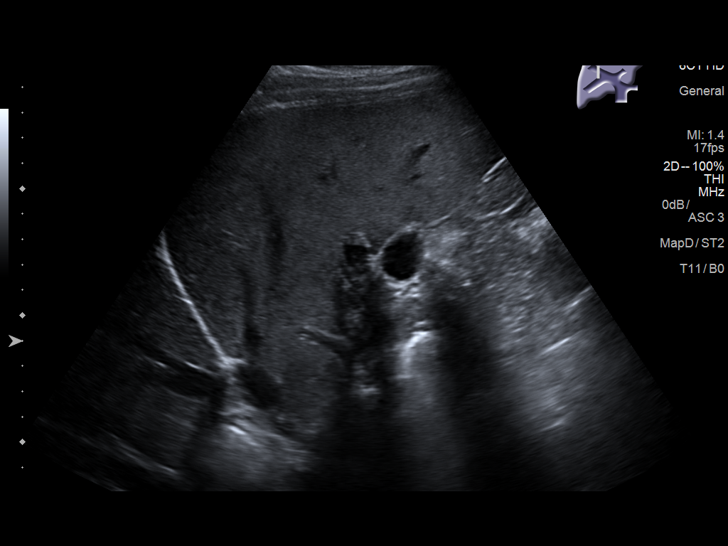
[im 48/104]
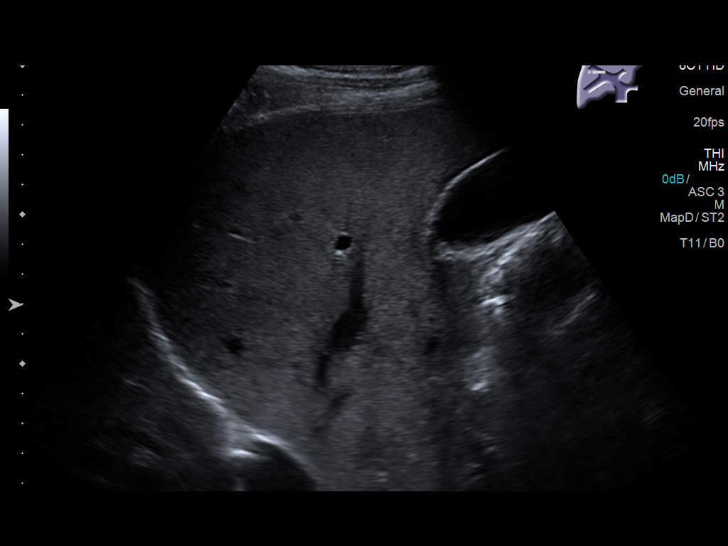
[im 56/104]
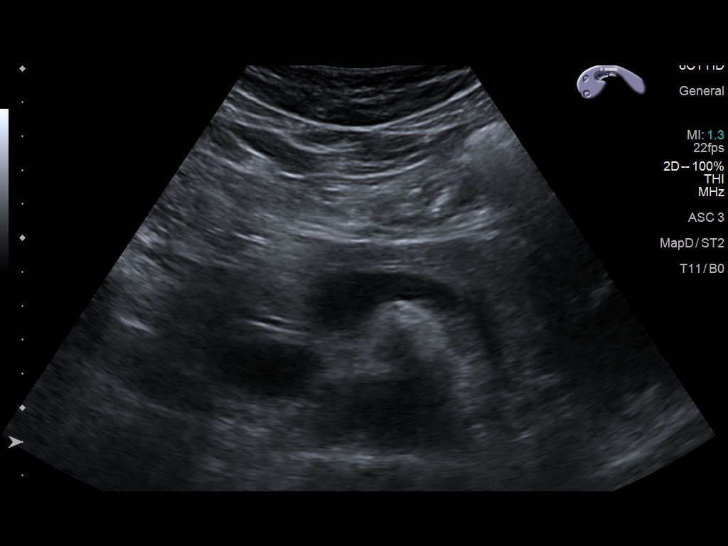
[im 65/104]
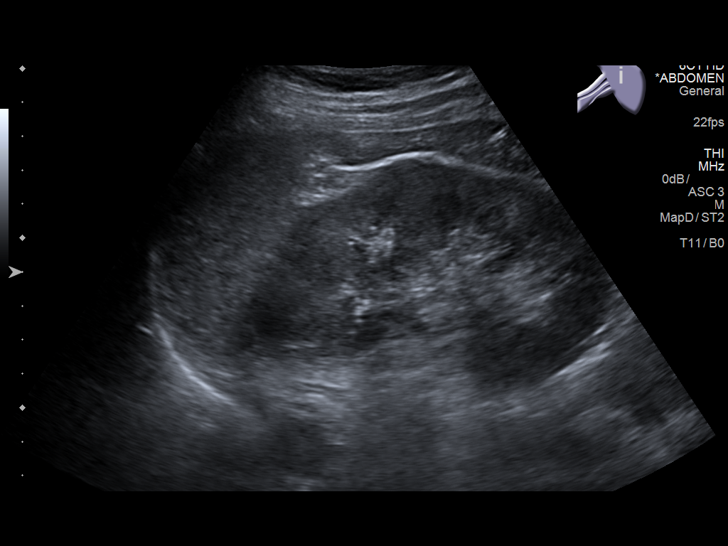
[im 69/104]
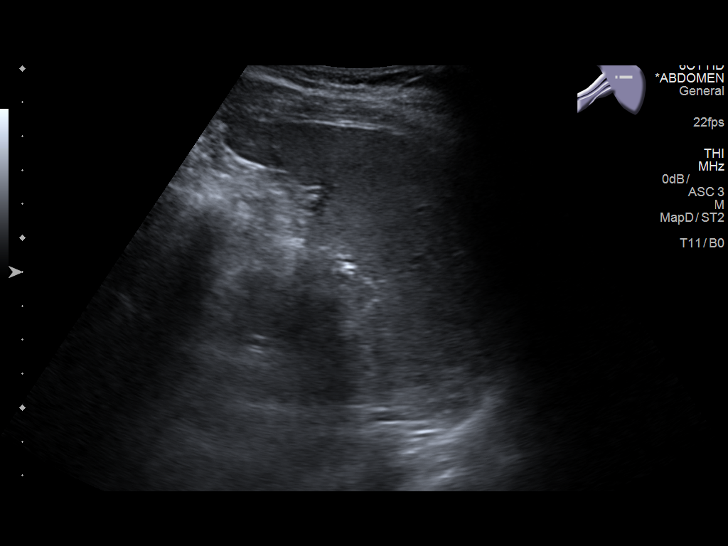
[im 78/104]
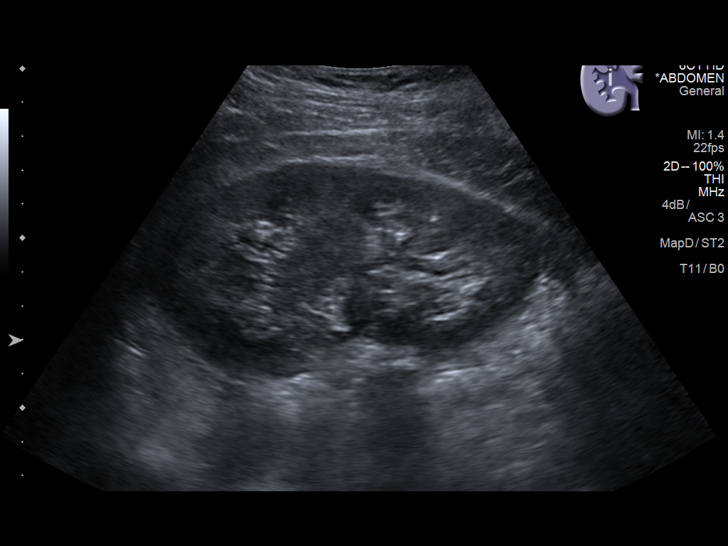
[im 86/104]
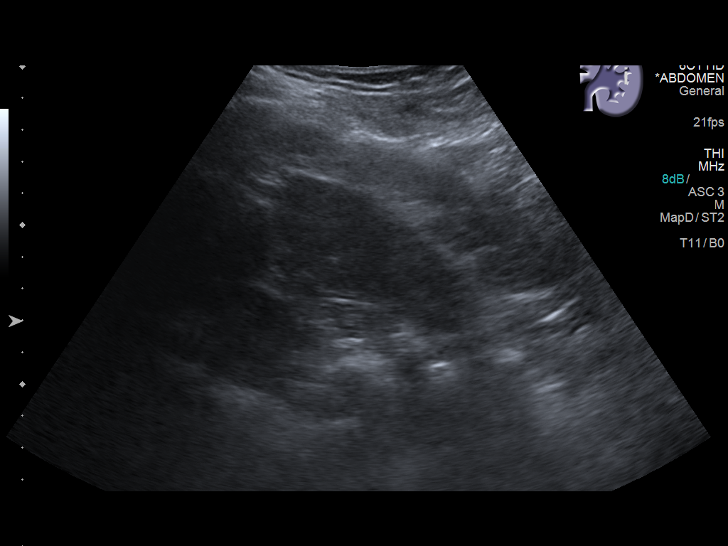
[im 95/104]
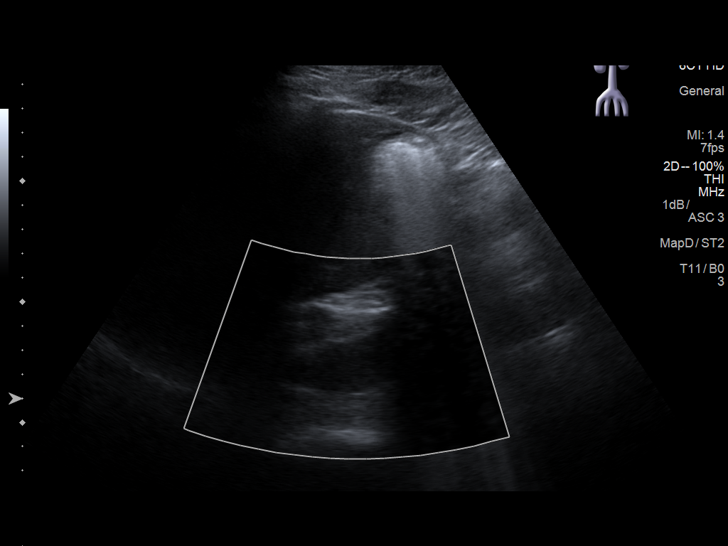
[im 104/104]
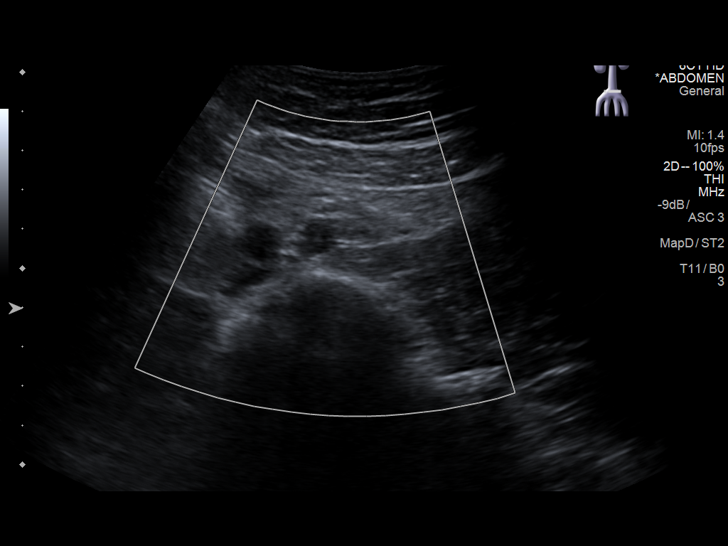

[14 of 25 positions shown; findings below may reference images not displayed]

FINDINGS: Gallbladder: No gallstones or wall thickening visualized. There is
no pericholecystic fluid. No sonographic Murphy sign noted by
sonographer.

Common bile duct: Diameter: 4 mm. No intrahepatic, common hepatic,
or common bile duct dilatation.

Liver: No focal lesion identified. Within normal limits in
parenchymal echogenicity. Portal vein is patent on color Doppler
imaging with normal direction of blood flow towards the liver.

IVC: No abnormality visualized.

Pancreas: Pancreatic mass or inflammatory focus.

Spleen: Size and appearance within normal limits.

Right Kidney: Length: 11.6 cm. Echogenicity within normal limits. No
mass or hydronephrosis visualized.

Left Kidney: Length: 11.2 cm. Echogenicity within normal limits. No
mass or hydronephrosis visualized.

Abdominal aorta: No aneurysm visualized.

Other findings: No demonstrable ascites.
IMPRESSION: Study within normal limits.

## 2019-12-16 DIAGNOSIS — F209 Schizophrenia, unspecified: Secondary | ICD-10-CM | POA: Diagnosis not present

## 2019-12-17 DIAGNOSIS — F209 Schizophrenia, unspecified: Secondary | ICD-10-CM | POA: Diagnosis not present

## 2019-12-17 DIAGNOSIS — F1211 Cannabis abuse, in remission: Secondary | ICD-10-CM | POA: Diagnosis not present

## 2020-01-02 ENCOUNTER — Other Ambulatory Visit (HOSPITAL_COMMUNITY): Payer: Self-pay | Admitting: Psychiatry

## 2020-03-03 DIAGNOSIS — F1211 Cannabis abuse, in remission: Secondary | ICD-10-CM | POA: Diagnosis not present

## 2020-03-03 DIAGNOSIS — F209 Schizophrenia, unspecified: Secondary | ICD-10-CM | POA: Diagnosis not present

## 2020-03-17 ENCOUNTER — Ambulatory Visit: Payer: Medicare Other | Admitting: Medical

## 2020-03-17 DIAGNOSIS — Z0289 Encounter for other administrative examinations: Secondary | ICD-10-CM

## 2020-03-20 DIAGNOSIS — F209 Schizophrenia, unspecified: Secondary | ICD-10-CM | POA: Diagnosis not present

## 2020-03-21 ENCOUNTER — Ambulatory Visit: Payer: Medicare Other | Admitting: Medical

## 2020-03-28 ENCOUNTER — Other Ambulatory Visit: Payer: Self-pay

## 2020-03-28 ENCOUNTER — Ambulatory Visit (INDEPENDENT_AMBULATORY_CARE_PROVIDER_SITE_OTHER): Payer: Medicare Other | Admitting: Medical

## 2020-03-28 VITALS — BP 113/86 | HR 90 | Temp 97.9°F | Resp 16 | Ht 70.0 in | Wt 189.6 lb

## 2020-03-28 DIAGNOSIS — F17200 Nicotine dependence, unspecified, uncomplicated: Secondary | ICD-10-CM | POA: Diagnosis not present

## 2020-03-28 DIAGNOSIS — R739 Hyperglycemia, unspecified: Secondary | ICD-10-CM

## 2020-03-28 DIAGNOSIS — F200 Paranoid schizophrenia: Secondary | ICD-10-CM

## 2020-03-28 DIAGNOSIS — K219 Gastro-esophageal reflux disease without esophagitis: Secondary | ICD-10-CM

## 2020-03-28 LAB — COMPREHENSIVE METABOLIC PANEL
ALT: 54 U/L — ABNORMAL HIGH (ref 0–53)
AST: 24 U/L (ref 0–37)
Albumin: 4.2 g/dL (ref 3.5–5.2)
Alkaline Phosphatase: 64 U/L (ref 39–117)
BUN: 10 mg/dL (ref 6–23)
CO2: 25 mEq/L (ref 19–32)
Calcium: 9.6 mg/dL (ref 8.4–10.5)
Chloride: 107 mEq/L (ref 96–112)
Creatinine, Ser: 0.89 mg/dL (ref 0.40–1.50)
GFR: 106.82 mL/min (ref >60.00)
Glucose, Bld: 83 mg/dL (ref 70–99)
Potassium: 4.1 mEq/L (ref 3.5–5.1)
Sodium: 139 mEq/L (ref 135–145)
Total Bilirubin: 0.3 mg/dL (ref 0.2–1.2)
Total Protein: 6.8 g/dL (ref 6.0–8.3)

## 2020-03-28 LAB — HEMOGLOBIN A1C: Hgb A1c MFr Bld: 5.4 % (ref 4.6–6.5)

## 2020-03-28 MED ORDER — FAMOTIDINE 20 MG PO TABS: 20.0000 mg | ORAL_TABLET | Freq: Every day | ORAL | 3 refills | Status: DC

## 2020-03-28 NOTE — Progress Notes (Signed)
Subjective:    Patient ID: James [name], male    DOB: 08-Jan-1981, 40 y.o.   MRN: [id number]  HPI  Pt in for follow up.  He had hx of mild upset stomach. Pt mother states he complains of feeling bloated sometimes. He states normal bm daily. No constipation. Pt is denying any heart burn. Mom states he has poor diet. He eats a lot of fast foods. Once a day at least.  He states likes pizza. He also like hamburger and french fries.  Hx of elevated sugar.   Pt has schizophrennia. Mood is stable. Pt is on lithium and zyprexa. Sees psychiatrist and therapist.  Pt is a smoker. He smokes about 10 cigarettes a day.    Review of Systems  Constitutional: Negative for chills, fatigue and fever.  HENT: Negative for congestion.   Eyes: Negative for redness and itching.  Respiratory: Negative for cough, chest tightness and wheezing.   Cardiovascular: Negative for chest pain and palpitations.  Gastrointestinal: Negative for abdominal pain.  Genitourinary: Negative for enuresis and flank pain.  Musculoskeletal: Negative for back pain.  Skin: Negative for rash.  Neurological: Negative for facial asymmetry, speech difficulty, weakness, light-headedness and numbness.  Hematological: Negative for adenopathy. Does not bruise/bleed easily.  Psychiatric/Behavioral: Negative for behavioral problems, decreased concentration, dysphoric mood and hallucinations. The patient is not nervous/anxious.        Stable presently.    Past Medical History:  Diagnosis Date  . Gastroesophageal reflux disease 11/25/2017     Social History   Socioeconomic History  . Marital status: Single    Spouse name: Not on file  . Number of children: Not on file  . Years of education: Not on file  . Highest education level: Not on file  Occupational History  . Not on file  Tobacco Use  . Smoking status: Current Every Day Smoker    Packs/day: 0.50    Types: Cigarettes  . Smokeless tobacco: Never Used  Substance  and Sexual Activity  . Alcohol use: No    Alcohol/week: 0.0 standard drinks    Comment: rare alcohol  . Drug use: No  . Sexual activity: Yes    Comment: male  Other Topics Concern  . Not on file  Social History Narrative  . Not on file   Social Determinants of Health   Financial Resource Strain: Not on file  Food Insecurity: Not on file  Transportation Needs: Not on file  Physical Activity: Not on file  Stress: Not on file  Social Connections: Not on file  Intimate Partner Violence: Not on file    No past surgical history on file.  Family History  Problem Relation Age of Onset  . Diabetes Mother   . Diabetes Father   . Prostate cancer Father     Allergies  Allergen Reactions  . Clozapine Other (See Comments)    Reaction unknown  . Mango Flavor     Mango; [place]: Patient has no known reaction to report. Mom states he just don't like.    Current Outpatient Medications on File Prior to Visit  Medication Sig Dispense Refill  . buPROPion (WELLBUTRIN SR) 150 MG 12 hr tablet Take 1 tablet (150 mg total) by mouth daily. 30 tablet 0  . lithium carbonate (ESKALITH) 450 MG CR tablet Take 1 tablet (450 mg total) by mouth 2 (two) times daily. 60 tablet 0  . metFORMIN (GLUCOPHAGE) 500 MG tablet Take 1 tablet (500 mg total) by mouth 2 (two)  times daily with a meal. 60 tablet 0  . OLANZapine (ZYPREXA) 10 MG tablet Take 1 tablet (10 mg total) by mouth daily. 30 tablet 0  . OLANZapine (ZYPREXA) 20 MG tablet Take 1 tablet (20 mg total) by mouth at bedtime. 30 tablet 0  . benztropine (COGENTIN) 0.5 MG tablet Take 0.5 mg by mouth 2 (two) times daily. (Patient not taking: Reported on 03/28/2020)    . famotidine (PEPCID) 20 MG tablet Take 1 tablet (20 mg total) by mouth daily. (Patient not taking: No sig reported) 30 tablet 3   No current facility-administered medications on file prior to visit.    BP 113/86   Pulse 90   Temp 97.9 F (36.6 C) (Oral)   Resp 16   Ht 5\' 10"  (1.778  m)   Wt 189 lb 9.6 oz (86 kg)   SpO2 92%   BMI 27.20 kg/m       Objective:   Physical Exam  General Mental Status- Alert. General Appearance- Not in acute distress.   Skin General: Color- Normal Color. Moisture- Normal Moisture.  Neck Carotid Arteries- Normal color. Moisture- Normal Moisture. No carotid bruits. No JVD.  Chest and Lung Exam Auscultation: Breath Sounds:-Normal.  Cardiovascular Auscultation:Rythm- Regular. Murmurs & Other Heart Sounds:Auscultation of the heart reveals- No Murmurs.  Abdomen Inspection:-Inspeection Normal. Palpation/Percussion:Note:No mass. Palpation and Percussion of the abdomen reveal- Non Tender, Non Distended + BS, no rebound or guarding.   Neurologic Cranial Nerve exam:- CN III-XII intact(No nystagmus), symmetric smile. Strength:- 5/5 equal and symmetric strength both upper and lower extremities.      Assessment & Plan:  History of elevated sugar. Will get a1c and cmp today. Continue metformin and low sugar diet.  For mild upset stomach appears to be probable  gerd. Will rx famotadine. Recommend healthier diet.  For schizophrenia continue on current psychiatric meds and follow up with specialist. Pt trying to get employed. He applies for jobs and then does not follow thru with interview.  Do recommend that you stop smoking completely.  Follow up in 3 months or as needed

## 2020-03-28 NOTE — Patient Instructions (Addendum)
History of elevated sugar. Will get a1c and cmp today. Continue metformin and low sugar diet.  For mild upset stomach(hx of appears) to be probable gerd. Will rx famotadine. Recommend healthier diet.  For schizophrenia continue on current psychiatric meds and follow up with specialist. Pt trying to get employed. He applies for jobs and then does not follow thru with interview. Recommend following thru with therapaist advise.  Do recommend that you stop smoking completely.  Follow up in 3 months or as needed

## 2020-03-31 DIAGNOSIS — F209 Schizophrenia, unspecified: Secondary | ICD-10-CM | POA: Diagnosis not present

## 2020-04-27 DIAGNOSIS — F209 Schizophrenia, unspecified: Secondary | ICD-10-CM | POA: Diagnosis not present

## 2020-05-01 ENCOUNTER — Other Ambulatory Visit: Payer: Self-pay | Admitting: Medical

## 2020-05-10 DIAGNOSIS — F209 Schizophrenia, unspecified: Secondary | ICD-10-CM | POA: Diagnosis not present

## 2020-05-22 DIAGNOSIS — F209 Schizophrenia, unspecified: Secondary | ICD-10-CM | POA: Diagnosis not present

## 2020-06-02 ENCOUNTER — Ambulatory Visit: Payer: Medicare Other | Admitting: Podiatry

## 2020-06-06 ENCOUNTER — Ambulatory Visit (INDEPENDENT_AMBULATORY_CARE_PROVIDER_SITE_OTHER): Payer: Medicare Other | Admitting: Podiatry

## 2020-06-06 ENCOUNTER — Other Ambulatory Visit: Payer: Self-pay

## 2020-06-06 ENCOUNTER — Ambulatory Visit: Payer: Medicare Other | Admitting: Podiatry

## 2020-06-06 ENCOUNTER — Encounter: Payer: Self-pay | Admitting: Podiatry

## 2020-06-06 DIAGNOSIS — M79674 Pain in right toe(s): Secondary | ICD-10-CM | POA: Diagnosis not present

## 2020-06-06 DIAGNOSIS — M79675 Pain in left toe(s): Secondary | ICD-10-CM

## 2020-06-06 DIAGNOSIS — Q828 Other specified congenital malformations of skin: Secondary | ICD-10-CM

## 2020-06-06 DIAGNOSIS — B351 Tinea unguium: Secondary | ICD-10-CM

## 2020-06-06 NOTE — Progress Notes (Signed)
This patient returns to the office for evaluation and treatment of long thick painful nails .  This patient is unable to trim his own nails since the patient cannot reach his feet.  Patient says the nails are painful walking and wearing his shoes. He also has painful callus under both big toes.   He presents to the office with his mother.He returns for preventive foot care services.  General Appearance  Alert, conversant and in no acute stress.  Vascular  Dorsalis pedis and posterior tibial  pulses are palpable  bilaterally.  Capillary return is within normal limits  bilaterally. Temperature is within normal limits  bilaterally.  Neurologic  Senn-Weinstein monofilament wire test within normal limits  bilaterally. Muscle power within normal limits bilaterally.  Nails Thick disfigured discolored nails with subungual debris  from hallux to fifth toes bilaterally. No evidence of bacterial infection or drainage bilaterally.  Orthopedic  No limitations of motion  feet .  No crepitus or effusions noted.  No bony pathology or digital deformities noted.  Skin  normotropic skin with no porokeratosis noted bilaterally.  No signs of infections or ulcers noted.  Callus/porokeratosis hallux  B/L   Onychomycosis  Pain in toes right foot  Pain in toes left foot  Callus hallux  B/L  Debridement  of nails  1-5  B/L with a nail nipper.  Nails were then filed using a dremel tool with no incidents.  Debride callus hallux  B/L.  RTC 3 months   Helane Gunther DPM

## 2020-06-26 DIAGNOSIS — F209 Schizophrenia, unspecified: Secondary | ICD-10-CM | POA: Diagnosis not present

## 2020-06-27 ENCOUNTER — Ambulatory Visit: Payer: Medicare Other | Admitting: Medical

## 2020-07-12 DIAGNOSIS — F209 Schizophrenia, unspecified: Secondary | ICD-10-CM | POA: Diagnosis not present

## 2020-07-16 ENCOUNTER — Emergency Department (EMERGENCY_DEPARTMENT_HOSPITAL)
Admission: EM | Admit: 2020-07-16 | Discharge: 2020-07-21 | Disposition: A | Discharge status: Home / Self Care | Payer: Medicare Other | Source: Home / Self Care | Attending: Emergency Medicine | Admitting: Emergency Medicine

## 2020-07-16 DIAGNOSIS — F200 Paranoid schizophrenia: Secondary | ICD-10-CM | POA: Diagnosis not present

## 2020-07-16 DIAGNOSIS — G4700 Insomnia, unspecified: Secondary | ICD-10-CM | POA: Diagnosis not present

## 2020-07-16 DIAGNOSIS — F23 Brief psychotic disorder: Secondary | ICD-10-CM | POA: Diagnosis not present

## 2020-07-16 DIAGNOSIS — R4689 Other symptoms and signs involving appearance and behavior: Secondary | ICD-10-CM

## 2020-07-16 DIAGNOSIS — R456 Violent behavior: Secondary | ICD-10-CM | POA: Diagnosis not present

## 2020-07-16 DIAGNOSIS — R259 Unspecified abnormal involuntary movements: Secondary | ICD-10-CM | POA: Insufficient documentation

## 2020-07-16 DIAGNOSIS — E119 Type 2 diabetes mellitus without complications: Secondary | ICD-10-CM | POA: Diagnosis not present

## 2020-07-16 DIAGNOSIS — R45850 Homicidal ideations: Secondary | ICD-10-CM | POA: Insufficient documentation

## 2020-07-16 DIAGNOSIS — Z20822 Contact with and (suspected) exposure to covid-19: Secondary | ICD-10-CM | POA: Insufficient documentation

## 2020-07-16 DIAGNOSIS — K219 Gastro-esophageal reflux disease without esophagitis: Secondary | ICD-10-CM | POA: Diagnosis not present

## 2020-07-16 DIAGNOSIS — R9431 Abnormal electrocardiogram [ECG] [EKG]: Secondary | ICD-10-CM | POA: Diagnosis not present

## 2020-07-16 DIAGNOSIS — F17210 Nicotine dependence, cigarettes, uncomplicated: Secondary | ICD-10-CM | POA: Insufficient documentation

## 2020-07-16 DIAGNOSIS — Z046 Encounter for general psychiatric examination, requested by authority: Secondary | ICD-10-CM

## 2020-07-16 DIAGNOSIS — F209 Schizophrenia, unspecified: Secondary | ICD-10-CM | POA: Diagnosis not present

## 2020-07-16 LAB — COMPREHENSIVE METABOLIC PANEL
ALT: 34 U/L (ref 0–44)
AST: 27 U/L (ref 15–41)
Albumin: 4.3 g/dL (ref 3.5–5.0)
Alkaline Phosphatase: 81 U/L (ref 38–126)
Anion gap: 10 (ref 5–15)
BUN: 14 mg/dL (ref 6–20)
CO2: 22 mmol/L (ref 22–32)
Calcium: 9.5 mg/dL (ref 8.9–10.3)
Chloride: 108 mmol/L (ref 98–111)
Creatinine, Ser: 0.93 mg/dL (ref 0.61–1.24)
GFR, Estimated: >60 mL/min (ref >60)
Glucose, Bld: 131 mg/dL — ABNORMAL HIGH (ref 70–99)
Potassium: 3.2 mmol/L — ABNORMAL LOW (ref 3.5–5.1)
Sodium: 140 mmol/L (ref 135–145)
Total Bilirubin: 1 mg/dL (ref 0.3–1.2)
Total Protein: 7.1 g/dL (ref 6.5–8.1)

## 2020-07-16 LAB — RAPID URINE DRUG SCREEN, HOSP PERFORMED
Amphetamines: NOT DETECTED
Barbiturates: NOT DETECTED
Benzodiazepines: NOT DETECTED
Cocaine: NOT DETECTED
Opiates: NOT DETECTED
Tetrahydrocannabinol: NOT DETECTED

## 2020-07-16 LAB — CBC
HCT: 45.8 % (ref 39.0–52.0)
Hemoglobin: 15 g/dL (ref 13.0–17.0)
MCH: 29.2 pg (ref 26.0–34.0)
MCHC: 32.8 g/dL (ref 30.0–36.0)
MCV: 89.1 fL (ref 80.0–100.0)
Platelets: 263 10*3/uL (ref 150–400)
RBC: 5.14 MIL/uL (ref 4.22–5.81)
RDW: 14.1 % (ref 11.5–15.5)
WBC: 5.6 10*3/uL (ref 4.0–10.5)
nRBC: 0 % (ref 0.0–0.2)

## 2020-07-16 LAB — SALICYLATE LEVEL: Salicylate Lvl: <7 mg/dL — ABNORMAL LOW (ref 7.0–30.0)

## 2020-07-16 LAB — RESP PANEL BY RT-PCR (FLU A&B, COVID) ARPGX2
Influenza A by PCR: NEGATIVE
Influenza B by PCR: NEGATIVE
SARS Coronavirus 2 by RT PCR: NEGATIVE

## 2020-07-16 LAB — ETHANOL: Alcohol, Ethyl (B): <10 mg/dL (ref <10)

## 2020-07-16 LAB — LITHIUM LEVEL: Lithium Lvl: <0.06 mmol/L — ABNORMAL LOW (ref 0.60–1.20)

## 2020-07-16 LAB — ACETAMINOPHEN LEVEL: Acetaminophen (Tylenol), Serum: <10 ug/mL — ABNORMAL LOW (ref 10–30)

## 2020-07-16 MED ORDER — METFORMIN HCL 500 MG PO TABS
500.0000 mg | ORAL_TABLET | Freq: Two times a day (BID) | ORAL | Status: DC
  Administered 2020-07-16 – 2020-07-21 (×8): 500 mg via ORAL
  Filled 2020-07-16 (×8): qty 1

## 2020-07-16 MED ORDER — HALOPERIDOL LACTATE 5 MG/ML IJ SOLN
5.0000 mg | Freq: Three times a day (TID) | INTRAMUSCULAR | Status: DC | PRN
  Administered 2020-07-16 – 2020-07-17 (×2): 5 mg via INTRAMUSCULAR
  Filled 2020-07-16 (×2): qty 1

## 2020-07-16 MED ORDER — OLANZAPINE 5 MG PO TABS
20.0000 mg | ORAL_TABLET | Freq: Every day | ORAL | Status: DC
  Administered 2020-07-18 – 2020-07-20 (×3): 20 mg via ORAL
  Filled 2020-07-16 (×3): qty 4

## 2020-07-16 MED ORDER — OLANZAPINE 5 MG PO TABS
10.0000 mg | ORAL_TABLET | Freq: Every day | ORAL | Status: DC
  Administered 2020-07-16 – 2020-07-21 (×5): 10 mg via ORAL
  Filled 2020-07-16 (×5): qty 2
  Filled 2020-07-16: qty 1
  Filled 2020-07-16: qty 2

## 2020-07-16 MED ORDER — LITHIUM CARBONATE ER 450 MG PO TBCR
450.0000 mg | EXTENDED_RELEASE_TABLET | Freq: Two times a day (BID) | ORAL | Status: DC
  Administered 2020-07-16 – 2020-07-21 (×8): 450 mg via ORAL
  Filled 2020-07-16 (×10): qty 1

## 2020-07-16 MED ORDER — DIPHENHYDRAMINE HCL 50 MG/ML IJ SOLN: 50.0000 mg | Freq: Three times a day (TID) | INTRAMUSCULAR | Status: DC | PRN

## 2020-07-16 MED ORDER — LORAZEPAM 2 MG/ML IJ SOLN
2.0000 mg | Freq: Three times a day (TID) | INTRAMUSCULAR | Status: DC | PRN
  Administered 2020-07-17: 07:00:00 2 mg via INTRAMUSCULAR
  Filled 2020-07-16: qty 1

## 2020-07-16 NOTE — ED Notes (Signed)
Patient pacing in room

## 2020-07-16 NOTE — Progress Notes (Signed)
Per Jon Gills, pt has been accepted to [facility] unit, pending neg Covid test. Accepting provider is Dr. Janann August. Patient can arrive 07/17/2020 after 10:00 am, after NEG COVID test is faxed to [phone]. Number for report is [phone]. CSW has notified the patient's nurse of acceptance information.    Crissie Reese, MSW, LCSW-A Phone: [phone] Disposition/TOC
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
## 2020-07-16 NOTE — Progress Notes (Signed)
Per Shnese Mills,NP, patient meets criteria for inpatient treatment. There are no available or appropriate beds at [facility] today. CSW faxed referrals to the following facilities for review:  [facility]  TTS will continue to seek bed placement.  Elyshia Kumagai, MSW, LCSW-A, LCAS-A Phone: [phone] Disposition/TOC  
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
## 2020-07-16 NOTE — ED Notes (Signed)
Pt started walking towards CT stating he "wanted to go home." Security was called. Continued to keep walking out of the ED saying he "wanted a pillow, I want to go home, [place] is [place] free [place]." This tech and another tech followed trying to reason with patient. Security escorted pt back to ED. Pt placed in room 3, this tech provided a pillow. Pt calm and resting in bed.

## 2020-07-16 NOTE — ED Notes (Signed)
Sitter left. No new sitter present at this time. Patient is noted to be anxious. Patient continues to walk out of room and into the hall way. Patient redirectable to go back into room. Patient is currently pacing in room.

## 2020-07-16 NOTE — ED Notes (Signed)
Patient has been wanded by [employer]. Covid Test collected and sent down to lab. Patient is resting and cooperative. Even Resp. No distress noted at this time. Sitter at bedside. Will continue to monitor.

## 2020-07-16 NOTE — ED Notes (Signed)
CSW from [facility] and Personnel from [facility] in [place] called this RN to inform that patient will be accepted to facility tomorrow if Covid Test is Negative and will require test results to be faxed to facility prior to admission.

## 2020-07-17 ENCOUNTER — Telehealth: Payer: Self-pay | Admitting: Medical

## 2020-07-17 DIAGNOSIS — R9431 Abnormal electrocardiogram [ECG] [EKG]: Secondary | ICD-10-CM | POA: Diagnosis not present

## 2020-07-17 DIAGNOSIS — F23 Brief psychotic disorder: Secondary | ICD-10-CM | POA: Diagnosis not present

## 2020-07-17 DIAGNOSIS — R259 Unspecified abnormal involuntary movements: Secondary | ICD-10-CM | POA: Diagnosis not present

## 2020-07-17 DIAGNOSIS — R456 Violent behavior: Secondary | ICD-10-CM | POA: Diagnosis not present
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
## 2020-07-17 NOTE — ED Notes (Signed)
Pt walked out of unit, [employer] followed pt back to his old room in green zone, pt cooperated & returned to room 51 in purple zone.
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
## 2020-07-17 NOTE — ED Provider Notes (Signed)
Emergency Medicine Observation Re-evaluation Note  James [name] is a 40 y.o. male, seen on rounds today.  Pt initially presented to the ED for complaints of IVC and Hallucinations Currently, the patient is sleeping.  Physical Exam  BP 101/65 (BP Location: Right Arm)   Pulse 81   Temp 98.5 F (36.9 C) (Oral)   Resp 17   SpO2 100%  Physical Exam General: Nontoxic Cardiac: Normal heart rate Lungs: Normal respiratory rate Psych: Calm  ED Course / MDM  EKG:EKG Interpretation  Date/Time:  Sunday July 16 2020 12:42:56 EDT Ventricular Rate:  65 PR Interval:  183 QRS Duration: 94 QT Interval:  411 QTC Calculation: 428 R Axis:   28 Text Interpretation: Sinus rhythm ST elev, probable normal early repol pattern Confirmed by Benjiman Core [phone]) on 07/16/2020 4:50:42 PM  I have reviewed the labs performed to date as well as medications administered while in observation.  Recent changes in the last 24 hours include he has been accepted to an inpatient psychiatric facility, with pending transfer there at 10 AM today..  Plan  Current plan is for psychiatric admission. Patient is under full IVC at this time.   Mancel Bale, MD 07/17/20 [phone]

## 2020-07-17 NOTE — ED Notes (Signed)
PT refused to have  Vitals.

## 2020-07-17 NOTE — ED Notes (Signed)
Pt sleeping & remains uninterested to interact with staff, refused midcations, sleeping & not eating dinner tray at this time. Pt shut the door, blinds are open for staff to view pt.

## 2020-07-17 NOTE — Progress Notes (Signed)
CSW contacted [facility] to complete a referral for placement. CSW spoke with an intake specialist and initiated the referral over the phone. CSW faxed the additional information to the hospital for further review. CSW will continue to follow up with the hospital to secure disposition.    Damita Dunnings, MSW, LCSW-A  3:13 PM 07/17/2020

## 2020-07-17 NOTE — ED Notes (Signed)
Pt ate his lunch then came out his room began pacing in the hallway. Pt now back in his room. RN aware

## 2020-07-17 NOTE — ED Notes (Signed)
Pt given dinner tray.

## 2020-07-17 NOTE — Telephone Encounter (Signed)
Gaurdian of patient called to inform James [name] that  [name] was admitted. She states [facility] was not able to find a bed for him so he was sent to [facility]. But she is unsure where in [place]. She wanted James [name] to be aware

## 2020-07-17 NOTE — ED Notes (Signed)
Sitter is back.

## 2020-07-17 NOTE — ED Notes (Addendum)
Pt's belongings inventoried & placed in locker #5 & valuables envelope sent to security.

## 2020-07-17 NOTE — ED Notes (Signed)
Breakfast Ordered 

## 2020-07-17 NOTE — ED Notes (Signed)
PT refused to have vitals taken 

## 2020-07-17 NOTE — Progress Notes (Signed)
CSW followed up with the patient's day shift RN regarding requested negative COVID test. Per the RN @ [facility], the patient has tested negative for COVID and the results have been faxed to [facility]. CSW reminded RN that the patient is able to arrival after 10 AM to the inpatient facility. RN will arrange transportation.    Damita Dunnings, MSW, LCSW-A  8:38 AM 07/17/2020

## 2020-07-17 NOTE — ED Notes (Signed)
Patient walking in and out of room and walking in hallways. Patient noted to be anxious and nervous. Pacing in room, getting in and out of bed. Patient requesting medication to help calm his nerves.

## 2020-07-17 NOTE — ED Notes (Signed)
Sitter went on lunch break at this time.

## 2020-07-18 ENCOUNTER — Encounter (HOSPITAL_COMMUNITY): Payer: Self-pay | Admitting: Registered Nurse

## 2020-07-18 DIAGNOSIS — F23 Brief psychotic disorder: Secondary | ICD-10-CM | POA: Diagnosis not present

## 2020-07-18 DIAGNOSIS — R456 Violent behavior: Secondary | ICD-10-CM | POA: Diagnosis not present

## 2020-07-18 DIAGNOSIS — R259 Unspecified abnormal involuntary movements: Secondary | ICD-10-CM | POA: Diagnosis not present

## 2020-07-18 DIAGNOSIS — F209 Schizophrenia, unspecified: Secondary | ICD-10-CM | POA: Diagnosis present

## 2020-07-18 DIAGNOSIS — R9431 Abnormal electrocardiogram [ECG] [EKG]: Secondary | ICD-10-CM | POA: Diagnosis not present

## 2020-07-18 NOTE — Consult Note (Addendum)
Telepsych Consultation   Reason for Consult:  IVC psychosis Referring Physician:  Jerrell Mylar Location of Patient: [facility] ED Location of Provider: Other: [facility]  Patient Identification: James [name] MRN:  [id number] Principal Diagnosis: Schizophrenia, unspecified (HCC) Diagnosis:  Principal Problem:   Schizophrenia, unspecified (HCC)   Total Time spent with patient: 30 minutes  Subjective:   James [name] is a 40 y.o. male patient admitted to [facility] ED after presenting under IVC by his mother with complaints of psychosis, increased agitation and aggression.  HPI:  [name], 40 y.o., male patient seen via tele health by this provider, consulted with Dr. Nelly Rout; and chart reviewed on ?07/18/20.  On evaluation [name] reports he is feeling good today.  Patient states he was brought to the hospital because "I was told I had been bad and needed to see what the problem was."  Patient states he felt that everybody could read his thoughts.  Patient states that he lives with his mother.  Patient state that he no longer has outpatient psychiatric services with [facility] but unable to tell where he gets services from.   Patient states that his mother helps him with his medications and that he has been compliant.    During evaluation James [name] is laying in bed in no acute distress.  He is alert, oriented x 4, calm and cooperative.  His mood is dysphoric with congruent affect.  He does not appear to be responding to internal/external stimuli or delusional thoughts; and he denies suicidal/self-harm/homicidal ideation, and psychosis.  Patient does admit to having some paranoia that people could read his thoughts. Patient answered question appropriately.   Spoke with patients nurse who reports patient is doing much better today since starting his medications.    Past Psychiatric History: See above  Risk to Self:  Possible risk to sell when off of  medications Risk to Others:  Possible when not taking medication Prior Inpatient Therapy:  Yes Prior Outpatient Therapy:  Yes  Past Medical History:  Past Medical History:  Diagnosis Date   Gastroesophageal reflux disease 11/25/2017   History reviewed. No pertinent surgical history. Family History:  Family History  Problem Relation Age of Onset   Diabetes Mother    Diabetes Father    Prostate cancer Father    Family Psychiatric  History: Unaware Social History:  Social History   Substance and Sexual Activity  Alcohol Use No   Alcohol/week: 0.0 standard drinks   Comment: rare alcohol     Social History   Substance and Sexual Activity  Drug Use No    Social History   Socioeconomic History   Marital status: Single    Spouse name: Not on file   Number of children: Not on file   Years of education: Not on file   Highest education level: Not on file  Occupational History   Not on file  Tobacco Use   Smoking status: Every Day    Packs/day: 0.50    Pack years: 0.00    Types: Cigarettes   Smokeless tobacco: Never  Substance and Sexual Activity   Alcohol use: No    Alcohol/week: 0.0 standard drinks    Comment: rare alcohol   Drug use: No   Sexual activity: Yes    Comment: male  Other Topics Concern   Not on file  Social History Narrative   Not on file   Social Determinants of Health   Financial Resource Strain:  Not on file  Food Insecurity: Not on file  Transportation Needs: Not on file  Physical Activity: Not on file  Stress: Not on file  Social Connections: Not on file   Additional Social History:    Allergies:   Allergies  Allergen Reactions   Clozapine Other (See Comments)    Reaction unknown   Mango Flavor     Mango; [place]: Patient has no known reaction to report. Mom states he just don't like.    Labs: No results found for this or any previous visit (from the past 48 hour(s)).  Medications:  Current Facility-Administered Medications   Medication Dose Route Frequency Provider Last Rate Last Admin   haloperidol lactate (HALDOL) injection 5 mg  5 mg Intramuscular TID PRN Ophelia Shoulder E, NP   5 mg at 07/17/20 3220   And   diphenhydrAMINE (BENADRYL) injection 50 mg  50 mg Intramuscular TID PRN Chales Abrahams, NP       And   LORazepam (ATIVAN) injection 2 mg  2 mg Intramuscular TID PRN Ophelia Shoulder E, NP   2 mg at 07/17/20 0651   lithium carbonate (ESKALITH) CR tablet 450 mg  450 mg Oral BID Ophelia Shoulder E, NP   450 mg at 07/18/20 1016   metFORMIN (GLUCOPHAGE) tablet 500 mg  500 mg Oral BID WC Liberty Handy, PA-C   500 mg at 07/18/20 0752   OLANZapine (ZYPREXA) tablet 10 mg  10 mg Oral Daily Ophelia Shoulder E, NP   10 mg at 07/18/20 1016   OLANZapine (ZYPREXA) tablet 20 mg  20 mg Oral QHS Chales Abrahams, NP       Current Outpatient Medications  Medication Sig Dispense Refill   buPROPion (WELLBUTRIN SR) 150 MG 12 hr tablet Take 1 tablet (150 mg total) by mouth daily. 30 tablet 0   lithium carbonate (ESKALITH) 450 MG CR tablet Take 1 tablet (450 mg total) by mouth 2 (two) times daily. 60 tablet 0   metFORMIN (GLUCOPHAGE) 500 MG tablet TAKE 1 TABLET BY MOUTH TWICE DAILY WITH MEALS (Patient taking differently: Take 500 mg by mouth 2 (two) times daily with a meal.) 180 tablet 0   OLANZapine (ZYPREXA) 10 MG tablet Take 1 tablet (10 mg total) by mouth daily. 30 tablet 0   OLANZapine (ZYPREXA) 20 MG tablet Take 1 tablet (20 mg total) by mouth at bedtime. 30 tablet 0   famotidine (PEPCID) 20 MG tablet Take 1 tablet (20 mg total) by mouth daily. (Patient not taking: Reported on 07/16/2020) 30 tablet 3   famotidine (PEPCID) 20 MG tablet Take 1 tablet (20 mg total) by mouth daily. (Patient not taking: Reported on 07/16/2020) 60 tablet 3    Musculoskeletal: Strength & Muscle Tone: within normal limits Gait & Station: normal Patient leans: N/A   Psychiatric Specialty Exam:  Presentation  General Appearance: Appropriate for  Environment  Eye Contact:Good  Speech:Clear and Coherent; Normal Rate  Speech Volume:Normal  Handedness:Right   Mood and Affect  Mood:Dysphoric  Affect:Congruent   Thought Process  Thought Processes:Goal Directed; Linear  Descriptions of Associations:Intact  Orientation:Full (Time, Place and Person)  Thought Content:Paranoid Ideation  History of Schizophrenia/Schizoaffective disorder:Yes  Duration of Psychotic Symptoms:Greater than six months  Hallucinations:Hallucinations: None  Ideas of Reference:Paranoia  Suicidal Thoughts:Suicidal Thoughts: No  Homicidal Thoughts:Homicidal Thoughts: No   Sensorium  Memory: No data recorded Judgment:Fair  Insight:Fair   Executive Functions  Concentration: No data recorded Attention Span:Fair  Recall:Fair  Fund of Knowledge:Fair  Language:Good   Psychomotor Activity  Psychomotor Activity:Psychomotor Activity: Normal   Assets  Assets:Communication Skills; Desire for Improvement; Financial Resources/Insurance; Housing; Social Support   Sleep  Sleep:Sleep: Good    Physical Exam: Physical Exam Vitals and nursing note reviewed. Exam conducted with a chaperone present.  Constitutional:      General: He is not in acute distress.    Appearance: Normal appearance. He is not ill-appearing.  HENT:     Head: Normocephalic.  Cardiovascular:     Rate and Rhythm: Normal rate.  Pulmonary:     Effort: Pulmonary effort is normal.  Neurological:     Mental Status: He is alert and oriented to person, place, and time.  Psychiatric:        Attention and Perception: Attention and perception normal. He does not perceive auditory or visual hallucinations.        Mood and Affect: Mood normal.        Speech: Speech normal.        Behavior: Behavior normal. Behavior is cooperative.        Thought Content: Thought content is paranoid.        Cognition and Memory: Cognition and memory normal.        Judgment: Judgment  is impulsive.   Review of Systems  Constitutional: Negative.   HENT: Negative.    Eyes: Negative.   Respiratory: Negative.    Cardiovascular: Negative.   Gastrointestinal: Negative.   Genitourinary: Negative.   Musculoskeletal: Negative.   Skin: Negative.   Neurological: Negative.   Endo/Heme/Allergies: Negative.   Psychiatric/Behavioral:  Negative for memory loss. Depression: Stable. Hallucinations: Denies. Substance abuse: Denies. Suicidal ideas: Denies.The patient does not have insomnia. Nervous/anxious: Denies.  Blood pressure (!) 119/97, pulse 94, temperature 97.9 F (36.6 C), resp. rate 16, SpO2 90 %. There is no height or weight on file to calculate BMI.  Treatment Plan Summary: Daily contact with patient to assess and evaluate symptoms and progress in treatment, Medication management, and Plan Will continue inpatient psychiatric treatment   Medication Management  lithium carbonate  450 mg Oral BID   metFORMIN  500 mg Oral BID WC   OLANZapine  10 mg Oral Daily   OLANZapine  20 mg Oral QHS    Lithium level 07/16/2020 < 0.6  Disposition: Recommend psychiatric Inpatient admission when medically cleared.  This service was provided via telemedicine using a 2-way, interactive audio and video technology.  Names of all persons participating in this telemedicine service and their role in this encounter. Name: Assunta Found Role: NP  Name: Dr. Nelly Rout Role: Psychiatrist  Name: James [name] Role: Patient  Name: Celine Ahr, RN Role: Patients nurse sent a secure message informing:  Psychiatric consult complete; continue to recommend inpatient psychiatric treatment.      Aiden Helzer, NP 07/18/2020 5:06 PM

## 2020-07-18 NOTE — ED Provider Notes (Signed)
Emergency Medicine Observation Re-evaluation Note  James [name] is a 40 y.o. male, seen on rounds today.  Pt initially presented to the ED for complaints of IVC and Hallucinations Currently, the patient is awake and alert, walking about the entry to his room.  Physical Exam  BP (!) 119/97 (BP Location: Right Arm)   Pulse 94   Temp 97.9 F (36.6 C)   Resp 16   SpO2 90%  Physical Exam General: No distress Cardiac: Regular rate and rhythm Lungs: No increased work of breathing Psych: No insight into his presentation, rambling thoughts  ED Course / MDM  EKG:EKG Interpretation  Date/Time:  Sunday July 16 2020 12:42:56 EDT Ventricular Rate:  65 PR Interval:  183 QRS Duration: 94 QT Interval:  411 QTC Calculation: 428 R Axis:   28 Text Interpretation: Sinus rhythm ST elev, probable normal early repol pattern Confirmed by Benjiman Core [phone]) on 07/16/2020 4:50:42 PM  I have reviewed the labs performed to date as well as medications administered while in observation.  Recent changes in the last 24 hours include none.  Plan  Current plan is for behavioral health placement. Patient is under full IVC at this time.   Gerhard Munch, MD 07/18/20 1049

## 2020-07-18 NOTE — ED Notes (Signed)
Patient was given a Cup of Ginger Ale. 

## 2020-07-18 NOTE — ED Notes (Signed)
TTS done 

## 2020-07-18 NOTE — BH Assessment (Signed)
Disposition:   Shuvon Rankin, NP, continues to recommend inpatient treatment. Pt remains on the waiting list for [facility] as confirmed with their staff Selena Batten) @2200 . Patient has been faxed out for additional disposition search @ 1413 as noted by morning Disposition CSW.

## 2020-07-18 NOTE — Progress Notes (Signed)
Patient has been faxed out due to no bed availability. Patient meets inpatient criteria per [facility]. Patient still on the waiting list for [facility], but disposition is continuing to be searched. Patient referred to the following facilities:  [facility]  [address] [place] [place] [address] [phone]  [facility]  [address] [place] [address] [phone]  [facility]  [address] [place] [address] [phone]  [facility]  [address] [place] [address] [phone]  [facility]  [address] [place], [place] [address] [phone]  [facility]  [address] [place] [address] [phone]  [facility]  [address] [place] [address] [phone]  [facility]  [address], [place] [address] [phone]  [facility]  [address] [place] [address] [phone]  [facility]  [address] [place] [address] [phone]  [facility]  [address] [place] [address] [phone]  [facility]  [address], [place] [address] [phone]  [facility]  [address], [place] [address] [phone]  [facility]  [address] [place] [address] [phone]  [facility]  [address] [place], [place] [address] [phone]  [facility]  [address], [place] [address] [phone]  [facility]  [address], [place] [address] [phone]  [facility]  [address] [place] [address] [phone]    CSW will continue to monitor disposition.    Damita Dunnings, MSW, LCSW-A  2:12 PM 07/18/2020

## 2020-07-19 DIAGNOSIS — R9431 Abnormal electrocardiogram [ECG] [EKG]: Secondary | ICD-10-CM | POA: Diagnosis not present

## 2020-07-19 DIAGNOSIS — F23 Brief psychotic disorder: Secondary | ICD-10-CM | POA: Diagnosis not present

## 2020-07-19 DIAGNOSIS — R259 Unspecified abnormal involuntary movements: Secondary | ICD-10-CM | POA: Diagnosis not present

## 2020-07-19 DIAGNOSIS — R456 Violent behavior: Secondary | ICD-10-CM | POA: Diagnosis not present

## 2020-07-19 NOTE — ED Notes (Signed)
Patient has been pacing around inside of his room this morning, sometimes speaking to himself in a low voice. He will sometimes close room door, blinds are open and patient is still visible to staff through clear glass portion. No complaints from patient at this time, when he is asked if he has any needs. Per night shift Sitter report this a.m., patient did not sleep much last night and was pacing inside of his room for many hours last night.

## 2020-07-19 NOTE — ED Notes (Signed)
Pt walked in his room mostly during the day. Continues to pace and appears suspicious. No anger outbursts noted. Pt is easy to redirect at this time. Safety precautions maintained. 1:1 with sitter continued.

## 2020-07-19 NOTE — ED Notes (Signed)
Received call from patient's mother requesting an update concerning his status.  Advised patient still recommended for inpatient treatment and remains on the [facility] waitlist.  Mother states she will call back later in the shift to check on him again.

## 2020-07-19 NOTE — ED Notes (Signed)
Refused VS at this time. Pt constantly pacing in the room. 1:1 continued with a sitter.

## 2020-07-19 NOTE — BH Assessment (Addendum)
Disposition:  @1154  CSW followed up with [facility] who confirmed the Pt is still under review. @ 2237 Disposition Counselor followed up with [facility] and "2238" confirmed that patient is still on the wait list. @2240 , patient faxed out to multiple alternative facilities for consideration of bed placement.

## 2020-07-19 NOTE — ED Provider Notes (Signed)
Emergency Medicine Observation Re-evaluation Note  James [name] is a 40 y.o. male, seen on rounds today.  Pt initially presented to the ED for complaints of IVC and Hallucinations Currently, the patient is awake and alert, walking around his room.  Physical Exam  BP (!) 125/92 (BP Location: Right Arm)   Pulse (!) 102   Temp (!) 97.4 F (36.3 C) (Oral)   Resp 18   SpO2 98%  Physical Exam General: Awake and alert Lungs: Respirations even and unlabored Psych: Pacing room  ED Course / MDM  EKG:EKG Interpretation  Date/Time:  Sunday July 16 2020 12:42:56 EDT Ventricular Rate:  65 PR Interval:  183 QRS Duration: 94 QT Interval:  411 QTC Calculation: 428 R Axis:   28 Text Interpretation: Sinus rhythm ST elev, probable normal early repol pattern Confirmed by Benjiman Core [phone]) on 07/16/2020 4:50:42 PM  I have reviewed the labs performed to date as well as medications administered while in observation.  Recent changes in the last 24 hours include pending placement, on [facility] waitlist.  Plan  Current plan is for placement, [facility] waitlist. Patient is under full IVC at this time.   Jeannie Fend, PA-C 07/19/20 1555    Arby Barrette, MD 07/30/20 1239

## 2020-07-19 NOTE — ED Notes (Signed)
Breakfast Ordered 

## 2020-07-20 DIAGNOSIS — F23 Brief psychotic disorder: Secondary | ICD-10-CM | POA: Diagnosis not present

## 2020-07-20 DIAGNOSIS — F209 Schizophrenia, unspecified: Secondary | ICD-10-CM | POA: Diagnosis not present

## 2020-07-20 DIAGNOSIS — R259 Unspecified abnormal involuntary movements: Secondary | ICD-10-CM | POA: Diagnosis not present

## 2020-07-20 DIAGNOSIS — R9431 Abnormal electrocardiogram [ECG] [EKG]: Secondary | ICD-10-CM | POA: Diagnosis not present

## 2020-07-20 DIAGNOSIS — R456 Violent behavior: Secondary | ICD-10-CM | POA: Diagnosis not present

## 2020-07-20 NOTE — ED Provider Notes (Addendum)
Emergency Medicine Observation Re-evaluation Note  James [name] is a 40 y.o. male, seen on rounds today.  Pt initially presented to the ED for complaints of IVC and Hallucinations Currently, the patient is walking around his room. Initially said his stomach didn't feel good. When I asked more about this problem, he told me he just wanted ginger ale.  Physical Exam  BP 109/78 (BP Location: Right Arm)   Pulse 68   Temp 98 F (36.7 C) (Oral)   Resp 15   SpO2 97%  Physical Exam General: no distress Lungs: no distress Psych: makes eye contact, cooperative  ED Course / MDM  EKG:EKG Interpretation  Date/Time:  Sunday July 16 2020 12:42:56 EDT Ventricular Rate:  65 PR Interval:  183 QRS Duration: 94 QT Interval:  411 QTC Calculation: 428 R Axis:   28 Text Interpretation: Sinus rhythm ST elev, probable normal early repol pattern Confirmed by Benjiman Core [phone]) on 07/16/2020 4:50:42 PM  I have reviewed the labs performed to date as well as medications administered while in observation.  Recent changes in the last 24 hours include none.  Plan  Current plan is for inpatient placement. Patient is under full IVC at this time.   Koleen Distance, MD 07/20/20 0930    Koleen Distance, MD 07/20/20 [phone]

## 2020-07-20 NOTE — ED Notes (Signed)
The patient ate 100% of his breakfast. The patient was offered a shower and hygiene products. The patient's bedding was changed while the patient was in the bathroom. The patient did not take a shower

## 2020-07-20 NOTE — ED Notes (Signed)
Breakfast Ordered 

## 2020-07-20 NOTE — Progress Notes (Signed)
CSW received call from [facility] centralized bed management office who covers [facility]. Patient has been declined due to "no appropriate beds".    Signed:  Corky Crafts, MSW, [place], LCASA 07/20/2020 10:02 PM
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
## 2020-07-20 NOTE — Progress Notes (Signed)
Patient continues to be on the wait list for [facility], but has been faxed out for additional review. Patient meets inpatient criteria per [facility],NP. Patient referred to the following facilities: [facility]  [address] [place] [address] [phone]  [facility]  [address] [place] [address] [phone]  [facility]  [address] [place] [address] [phone]  [facility]  [address] [place] [address] [phone]  [facility]  [address] [place], [place] [address] [phone]  [facility]  [address] [place] [address] [phone]  [facility]  [address] [place] [address] [phone]  [facility]  [address], [place] [address] [phone]  [facility]  [address] [place] [address] [phone]  [facility]  [address] [place] [address] [phone]  [facility]  [address] [place] [address] [phone]  [facility]  [address] [place] [address] [phone]  [facility]  [address] [place] [address] [phone]  [facility]  [address] [place] [address] [phone]  [facility]  [address] [place], [place] [address] [phone]  [facility]  [address] [place] [address] [phone]  [facility]  [address], [place] [address] [phone]  [facility]  [address] [place] [address] [phone]   CSW will continue to monitor disposition.     Damita Dunnings, MSW, LCSW-A  9:44 AM 07/20/2020

## 2020-07-21 ENCOUNTER — Inpatient Hospital Stay (HOSPITAL_COMMUNITY)
Admission: RE | Admit: 2020-07-21 | Discharge: 2020-07-28 | DRG: 885 | Disposition: A | Discharge status: Home / Self Care | Payer: Medicare Other | Attending: Psychiatry | Admitting: Psychiatry

## 2020-07-21 ENCOUNTER — Other Ambulatory Visit: Payer: Self-pay

## 2020-07-21 ENCOUNTER — Encounter (HOSPITAL_COMMUNITY): Payer: Self-pay | Admitting: Behavioral Health

## 2020-07-21 DIAGNOSIS — F17210 Nicotine dependence, cigarettes, uncomplicated: Secondary | ICD-10-CM | POA: Diagnosis present

## 2020-07-21 DIAGNOSIS — Z7984 Long term (current) use of oral hypoglycemic drugs: Secondary | ICD-10-CM

## 2020-07-21 DIAGNOSIS — Z888 Allergy status to other drugs, medicaments and biological substances status: Secondary | ICD-10-CM

## 2020-07-21 DIAGNOSIS — G4700 Insomnia, unspecified: Secondary | ICD-10-CM | POA: Diagnosis present

## 2020-07-21 DIAGNOSIS — F200 Paranoid schizophrenia: Principal | ICD-10-CM | POA: Diagnosis present

## 2020-07-21 DIAGNOSIS — Z833 Family history of diabetes mellitus: Secondary | ICD-10-CM

## 2020-07-21 DIAGNOSIS — Z20822 Contact with and (suspected) exposure to covid-19: Secondary | ICD-10-CM | POA: Diagnosis present

## 2020-07-21 DIAGNOSIS — Z79899 Other long term (current) drug therapy: Secondary | ICD-10-CM

## 2020-07-21 DIAGNOSIS — E119 Type 2 diabetes mellitus without complications: Secondary | ICD-10-CM | POA: Diagnosis present

## 2020-07-21 DIAGNOSIS — F209 Schizophrenia, unspecified: Secondary | ICD-10-CM | POA: Diagnosis present

## 2020-07-21 DIAGNOSIS — K219 Gastro-esophageal reflux disease without esophagitis: Secondary | ICD-10-CM | POA: Diagnosis present

## 2020-07-21 LAB — TSH: TSH: 0.818 u[IU]/mL (ref 0.350–4.500)

## 2020-07-21 MED ORDER — LITHIUM CARBONATE ER 450 MG PO TBCR
450.0000 mg | EXTENDED_RELEASE_TABLET | Freq: Two times a day (BID) | ORAL | Status: DC
Start: 2020-07-21 — End: 2020-07-28
  Administered 2020-07-21 – 2020-07-28 (×14): 450 mg via ORAL
  Filled 2020-07-21 (×20): qty 1

## 2020-07-21 MED ORDER — METFORMIN HCL 500 MG PO TABS
500.0000 mg | ORAL_TABLET | Freq: Two times a day (BID) | ORAL | Status: DC
  Administered 2020-07-21 – 2020-07-28 (×14): 500 mg via ORAL
  Filled 2020-07-21 (×21): qty 1

## 2020-07-21 MED ORDER — LORAZEPAM 1 MG PO TABS: 2.0000 mg | ORAL_TABLET | Freq: Four times a day (QID) | ORAL | Status: DC | PRN

## 2020-07-21 MED ORDER — OLANZAPINE 10 MG PO TBDP: 10.0000 mg | ORAL_TABLET | Freq: Three times a day (TID) | ORAL | Status: DC | PRN

## 2020-07-21 MED ORDER — OLANZAPINE 10 MG PO TABS
10.0000 mg | ORAL_TABLET | Freq: Every day | ORAL | Status: DC
  Administered 2020-07-22 – 2020-07-28 (×7): 10 mg via ORAL
  Filled 2020-07-21 (×10): qty 1

## 2020-07-21 MED ORDER — FAMOTIDINE 20 MG PO TABS
20.0000 mg | ORAL_TABLET | Freq: Two times a day (BID) | ORAL | Status: DC
  Administered 2020-07-21 – 2020-07-28 (×14): 20 mg via ORAL
  Filled 2020-07-21 (×19): qty 1

## 2020-07-21 MED ORDER — OLANZAPINE 10 MG PO TABS
20.0000 mg | ORAL_TABLET | Freq: Every day | ORAL | Status: DC
Start: 2020-07-21 — End: 2020-07-28
  Administered 2020-07-21 – 2020-07-27 (×7): 20 mg via ORAL
  Filled 2020-07-21 (×10): qty 2

## 2020-07-21 MED ORDER — LITHIUM CARBONATE ER 300 MG PO TBCR: 300.0000 mg | EXTENDED_RELEASE_TABLET | Freq: Two times a day (BID) | ORAL | Status: DC

## 2020-07-21 MED ORDER — ZIPRASIDONE MESYLATE 20 MG IM SOLR: 20.0000 mg | Freq: Two times a day (BID) | INTRAMUSCULAR | Status: DC | PRN

## 2020-07-21 NOTE — ED Notes (Signed)
Pt pacing around room, cooperative and calm at this time.

## 2020-07-21 NOTE — ED Notes (Signed)
Report received. Patient resting in bed at this time. No acute distress noted. No needs.
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
## 2020-07-21 NOTE — ED Notes (Signed)
Pt released to to [facility] Dept for transport to [facility].

## 2020-07-21 NOTE — Tx Team (Signed)
Initial Treatment Plan 07/21/2020 5:44 PM [name] HFS:[id number]    PATIENT STRESSORS: Marital or family conflict Medication change or noncompliance   PATIENT STRENGTHS: Physical Health Other: Has benefits   PATIENT IDENTIFIED PROBLEMS: Psychosis  High risk for violence    "Get back in good health"  No other goal provided             DISCHARGE CRITERIA:  Improved stabilization in mood, thinking, and/or behavior Need for constant or close observation no longer present Reduction of life-threatening or endangering symptoms to within safe limits Verbal commitment to aftercare and medication compliance  PRELIMINARY DISCHARGE PLAN: Outpatient therapy Medication management  PATIENT/FAMILY INVOLVEMENT: This treatment plan has been presented to and reviewed with the patient, James [name].  The patient and family have been given the opportunity to ask questions and make suggestions.  Levin Bacon, RN 07/21/2020, 5:44 PM

## 2020-07-21 NOTE — Progress Notes (Signed)
Pt visible pacing unit responding to internal stimuli talking to people not seen by staff    07/21/20 2100  Psych Admission Type (Psych Patients Only)  Admission Status Involuntary  Psychosocial Assessment  Patient Complaints Suspiciousness;Worrying  Eye Contact Brief  Facial Expression Flat  Affect Flat  Speech Soft;Slow  Interaction Avoidant;Cautious;Forwards little;Minimal;No initiation  Motor Activity Restless;Pacing  Appearance/Hygiene In scrubs;Disheveled;Body odor  Behavior Characteristics Cooperative  Mood Preoccupied;Suspicious  Thought Process  Coherency Circumstantial  Content Blaming others;Paranoia  Delusions Paranoid  Perception Hallucinations  Hallucination Auditory (However he denies but is clearly responding to internal stimuli)  Judgment Impaired  Confusion WDL  Danger to Self  Current suicidal ideation? Denies  Danger to Others  Danger to Others None reported or observed

## 2020-07-21 NOTE — Progress Notes (Signed)
Pt accepted to [facility]  rm 500-1  Patient meets inpatient criteria per Liborio Nixon, NP   Dr. Jola Babinski is the attending provider.    Call report to [phone]    Dianna Limbo, RN @ [facility] ED notified.     Pt scheduled  to arrive at [facility] at 1300 PM   Signed:  Corky Crafts, MSW, LCSWA, LCASA 07/21/2020 12:08 PM

## 2020-07-21 NOTE — ED Provider Notes (Signed)
Emergency Medicine Observation Re-evaluation Note  James [name] is a 40 y.o. male, seen on rounds today.  Pt initially presented to the ED for complaints of IVC and Hallucinations Currently, the patient is walking around the room.  Physical Exam  BP 104/84 (BP Location: Right Arm)   Pulse 84   Temp 98 F (36.7 C) (Oral)   Resp 16   SpO2 99%  Physical Exam General: Well-appearing Cardiac: Normal rate Lungs: No increased work of breathing Psych: Calm and cooperative  ED Course / MDM  EKG:EKG Interpretation  Date/Time:  Sunday July 16 2020 12:42:56 EDT Ventricular Rate:  65 PR Interval:  183 QRS Duration: 94 QT Interval:  411 QTC Calculation: 428 R Axis:   28 Text Interpretation: Sinus rhythm ST elev, probable normal early repol pattern Confirmed by Benjiman Core [phone]) on 07/16/2020 4:50:42 PM  I have reviewed the labs performed to date as well as medications administered while in observation.  Recent changes in the last 24 hours include none.  Plan  Current plan is for awaiting inpatient placement. Patient is under full IVC at this time.   Rolan Bucco, MD 07/21/20 0930

## 2020-07-21 NOTE — Progress Notes (Signed)
Adult Psychoeducational Group Note  Date:  07/21/2020 Time:  10:28 PM  Group Topic/Focus:  Wrap-Up Group:   The focus of this group is to help patients review their daily goal of treatment and discuss progress on daily workbooks.  Participation Level:  Did Not Attend  Participation Quality:   Did Not Attend  Affect:  Did Not Attend  Cognitive:  Did Not Attend  Insight: None  Engagement in Group:  Did Not Attend  Modes of Intervention:  Did Not Attend  Additional Comments:  Did not attend evening wrap up group tonight.  Felipa Furnace 07/21/2020, 10:28 PM

## 2020-07-21 NOTE — ED Notes (Signed)
Pt given lunch tray.

## 2020-07-22 DIAGNOSIS — F200 Paranoid schizophrenia: Principal | ICD-10-CM

## 2020-07-22 LAB — LIPID PANEL
Cholesterol: 132 mg/dL (ref 0–200)
HDL: 42 mg/dL (ref >40)
LDL Cholesterol: 74 mg/dL (ref 0–99)
Total CHOL/HDL Ratio: 3.1 RATIO
Triglycerides: 80 mg/dL (ref <150)
VLDL: 16 mg/dL (ref 0–40)

## 2020-07-22 LAB — GLUCOSE, CAPILLARY: Glucose-Capillary: 95 mg/dL (ref 70–99)

## 2020-07-22 NOTE — Progress Notes (Signed)
Adult Psychoeducational Group Note  Date:  07/22/2020 Time:  8:20 PM  Group Topic/Focus:  Wrap-Up Group:   The focus of this group is to help patients review their daily goal of treatment and discuss progress on daily workbooks.  Participation Level:  Did Not Attend  Participation Quality:   Did Not Attend  Affect:  Did Not Attend  Cognitive:  Did Not Attend  Insight: None  Engagement in Group:  Did Not Attend  Modes of Intervention:  Did Not Attend  Additional Comments:  Pt did not attend wrap up group tonight.  Felipa Furnace 07/22/2020, 8:20 PM
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
## 2020-07-22 NOTE — B[facility] Group Notes (Signed)
[facility] Group Notes: (Clinical Social Work)   07/22/2020      Type of Therapy:  Group Therapy   Participation Level:  Did Not Attend - was invited both individually by MHT and by overhead announcement, chose not to attend.   Ambrose Mantle, LCSW 07/22/2020, 1:59 PM

## 2020-07-22 NOTE — Progress Notes (Signed)
   07/22/20 1100  Psych Admission Type (Psych Patients Only)  Admission Status Involuntary  Psychosocial Assessment  Patient Complaints Suspiciousness  Eye Contact Brief  Facial Expression Flat  Affect Flat  Speech Soft;Slow  Interaction Avoidant;Cautious;Forwards little;Minimal;No initiation  Motor Activity Restless;Pacing  Appearance/Hygiene In scrubs;Disheveled;Body odor  Behavior Characteristics Cooperative  Mood Suspicious;Preoccupied  Thought Process  Coherency Circumstantial  Content Blaming others;Paranoia  Delusions Paranoid  Perception Hallucinations  Hallucination Auditory (However he denies but is clearly responding to internal stimuli)  Judgment Impaired  Confusion WDL  Danger to Self  Current suicidal ideation? Denies  Danger to Others  Danger to Others None reported or observed
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
## 2020-07-22 NOTE — Progress Notes (Signed)
   07/22/20 0500  Sleep  Number of Hours 5.5
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
## 2020-07-23 LAB — GLUCOSE, CAPILLARY: Glucose-Capillary: 100 mg/dL — ABNORMAL HIGH (ref 70–99)

## 2020-07-23 MED ORDER — ARIPIPRAZOLE 10 MG PO TABS
10.0000 mg | ORAL_TABLET | Freq: Every day | ORAL | Status: DC
  Administered 2020-07-23 – 2020-07-28 (×6): 10 mg via ORAL
  Filled 2020-07-23 (×9): qty 1

## 2020-07-23 NOTE — Progress Notes (Signed)
D:  Patient's self inventory sheet, patient has fair sleep, sleep medication helpful.  Good appetite, low energy level, good concentration.  Rated depression and hopeless 8, anxiety 9.  Denied withdrawals.  SI, no plan, contracts for safety.  Denied HI.  A/V hallucinations all the time. A:  Medications administered per MD orders.  Emotional support and encouragement given patient. R:  Denied HI.  SI, contracts for safety.  A/V hallucinations "all the time".

## 2020-07-23 NOTE — Plan of Care (Signed)
Nurse discussed coping skills with patient.  

## 2020-07-23 NOTE — Progress Notes (Signed)
Adult Psychoeducational Group Note  Date:  07/23/2020 Time:  10:52 PM  Group Topic/Focus:  Wrap-Up Group:   The focus of this group is to help patients review their daily goal of treatment and discuss progress on daily workbooks.  Participation Level:  Minimal  Participation Quality:  Inattentive  Affect:  Flat  Cognitive:  Disorganized  Insight: Improving  Engagement in Group:  Poor  Modes of Intervention:  Discussion  Additional Comments: Review patient's goals, progress, and objectives and examine how group skills were used daily. Review patient self-monitoring logs concerning their goals. Discuss symptoms, supportive counseling, identification, and exploration of emotions. End of Wrap-Up Group progress     Nicoletta Dress 07/23/2020, 10:52 PM

## 2020-07-23 NOTE — Progress Notes (Signed)
   07/22/20 2140  Psych Admission Type (Psych Patients Only)  Admission Status Involuntary  Psychosocial Assessment  Patient Complaints Suspiciousness  Eye Contact Brief  Facial Expression Flat  Affect Flat  Speech Logical/coherent;Soft;Slow  Interaction Cautious;Forwards little;Minimal;No initiation  Motor Activity Pacing  Appearance/Hygiene In scrubs;Improved  Behavior Characteristics Cooperative;Appropriate to situation  Mood Suspicious  Thought Process  Coherency Circumstantial  Content Blaming others;Paranoia  Delusions Paranoid  Perception Hallucinations  Hallucination Auditory (Patient appears to be responding to internal stimuli)  Judgment Impaired  Confusion None  Danger to Self  Current suicidal ideation? Denies  Danger to Others  Danger to Others None reported or observed
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
## 2020-07-23 NOTE — [facility] Group Notes (Signed)
[facility] LCSW Group Therapy Note  Date/Time:  07/23/2020 9:00-10:00 or 10:00-11:00AM  Type of Therapy and Topic:  Group Therapy:  Healthy and Unhealthy Supports  Participation Level:  Did Not Attend   Description of Group:  Patients in this group were introduced to the idea of adding a variety of healthy supports to address the various needs in their lives.Patients discussed what additional healthy supports could be helpful in their recovery and wellness after discharge in order to prevent future hospitalizations.   An emphasis was placed on using counselor, doctor, therapy groups, 12-step groups, and problem-specific support groups to expand supports.  They also worked as a group on developing a specific plan for several patients to deal with unhealthy supports through boundary-setting, psychoeducation with loved ones, and even termination of relationships.   Therapeutic Goals:   1)  discuss importance of adding supports to stay well once out of the hospital  2)  compare healthy versus unhealthy supports and identify some examples of each  3)  generate ideas and descriptions of healthy supports that can be added  4)  offer mutual support about how to address unhealthy supports  5)  encourage active participation in and adherence to discharge plan    Summary of Patient Progress:  The patient  did not attend   Therapeutic Modalities:   Motivational Interviewing Brief Solution-Focused Therapy  [employer]        

## 2020-07-24 LAB — GLUCOSE, CAPILLARY: Glucose-Capillary: 104 mg/dL — ABNORMAL HIGH (ref 70–99)

## 2020-07-24 MED ORDER — ARIPIPRAZOLE ER 400 MG IM SRER
400.0000 mg | INTRAMUSCULAR | Status: DC
  Administered 2020-07-25: 400 mg via INTRAMUSCULAR
  Filled 2020-07-24: qty 2

## 2020-07-24 MED ORDER — DOXEPIN HCL 25 MG PO CAPS: 25.0000 mg | ORAL_CAPSULE | Freq: Every evening | ORAL | Status: DC | PRN
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
## 2020-07-24 NOTE — Progress Notes (Signed)
   07/24/20 0550  Vital Signs  Pulse Rate 97  BP (!) 135/98  BP Location Right Arm  BP Method Automatic  Patient Position (if appropriate) Standing   D: Patient denies SI/HI/AVH. Pt. Denies anxiety and depression. A:  Patient took scheduled medicine.  Support and encouragement provided Routine safety checks conducted every 15 minutes. Patient  Informed to notify staff with any concerns.   R:  Safety maintained.

## 2020-07-24 NOTE — Progress Notes (Signed)
Pt did not attend psycho-ed group. 

## 2020-07-24 NOTE — Progress Notes (Signed)
Pt has been calm this shift, observed  walking on the hallway minding his own business. Pt took a shower and changed his cloths. Medication given as scheduled, denied S/HI and contracted for safety, will continue to monitor.

## 2020-07-24 NOTE — Progress Notes (Signed)
Pt did not attend orientation group.  

## 2020-07-24 NOTE — [facility] Group Notes (Signed)
LCSW Group Therapy Notes   Type of Therapy and Topic: Group Therapy: Healthy Vs. Unhealthy Coping Strategies   Date and Time: 07/24/20 1:00PM   Participation Level: [facility] PARTICIPATION LEVEL: Did Not Attend   Description of Group: In this group, patients will be encouraged to explore their healthy and unhealthy coping strategics. Coping strategies are actions that we take to deal with stress, problems, or uncomfortable emotions in our daily lives. Each patient will be challenged to read some scenarios and discuss the unhealthy and healthy coping strategies within those scenarios. Also, each patient will be challenged to describe current healthy and unhealthy strategies that they use in their own lives and discuss the outcomes and barriers to those strategies. This group will be process-oriented, with patients participating in exploration of their own experiences as well as giving and receiving support and challenge from other group members.   Therapeutic Goals: Patient will identify personal healthy and unhealthy coping strategies. Patient will identify healthy and unhealthy coping strategies, in others, through scenarios. Patient will identify expected outcomes of healthy and unhealthy coping strategies. Patient will identify barriers to using healthy coping strategies.   Summary of Patient Progress: did not attend     Therapeutic Modalities:   Cognitive Behavioral Therapy Solution Focused Therapy Motivational Interviewing   

## 2020-07-24 NOTE — Tx Team (Signed)
Interdisciplinary Treatment and Diagnostic Plan Update  07/24/2020 Time of Session: 10:45am James [name] MRN: [id number]  Principal Diagnosis: <principal problem not specified>  Secondary Diagnoses: Active Problems:   Schizophrenia, unspecified ([place])   Current Medications:  Current Facility-Administered Medications  Medication Dose Route Frequency Provider Last Rate Last Admin   ARIPiprazole (ABILIFY) tablet 10 mg  10 mg Oral Daily Sharma Covert, MD   10 mg at 07/24/20 0733   [START ON 07/25/2020] ARIPiprazole ER (ABILIFY MAINTENA) injection 400 mg  400 mg Intramuscular Q28 days Sharma Covert, MD       famotidine (PEPCID) tablet 20 mg  20 mg Oral BID Sharma Covert, MD   20 mg at 07/24/20 3086   lithium carbonate (ESKALITH) CR tablet 450 mg  450 mg Oral BID Sharma Covert, MD   450 mg at 07/24/20 0733   OLANZapine zydis (ZYPREXA) disintegrating tablet 10 mg  10 mg Oral Q8H PRN Sharma Covert, MD       And   LORazepam (ATIVAN) tablet 2 mg  2 mg Oral Q6H PRN Sharma Covert, MD       And   ziprasidone (GEODON) injection 20 mg  20 mg Intramuscular Q12H PRN Sharma Covert, MD       metFORMIN (GLUCOPHAGE) tablet 500 mg  500 mg Oral BID WC Sharma Covert, MD   500 mg at 07/24/20 0733   OLANZapine (ZYPREXA) tablet 10 mg  10 mg Oral Daily Sharma Covert, MD   10 mg at 07/24/20 0733   OLANZapine (ZYPREXA) tablet 20 mg  20 mg Oral QHS Sharma Covert, MD   20 mg at 07/23/20 2021   PTA Medications: Medications Prior to Admission  Medication Sig Dispense Refill Last Dose   buPROPion (WELLBUTRIN SR) 150 MG 12 hr tablet Take 1 tablet (150 mg total) by mouth daily. 30 tablet 0    famotidine (PEPCID) 20 MG tablet Take 1 tablet (20 mg total) by mouth daily. (Patient not taking: Reported on 07/16/2020) 30 tablet 3    famotidine (PEPCID) 20 MG tablet Take 1 tablet (20 mg total) by mouth daily. (Patient not taking: Reported on 07/16/2020) 60 tablet 3    lithium  carbonate (ESKALITH) 450 MG CR tablet Take 1 tablet (450 mg total) by mouth 2 (two) times daily. 60 tablet 0    metFORMIN (GLUCOPHAGE) 500 MG tablet TAKE 1 TABLET BY MOUTH TWICE DAILY WITH MEALS (Patient taking differently: Take 500 mg by mouth 2 (two) times daily with a meal.) 180 tablet 0    OLANZapine (ZYPREXA) 10 MG tablet Take 1 tablet (10 mg total) by mouth daily. 30 tablet 0    OLANZapine (ZYPREXA) 20 MG tablet Take 1 tablet (20 mg total) by mouth at bedtime. 30 tablet 0     Patient Stressors: Marital or family conflict Medication change or noncompliance  Patient Strengths: Physical Health Other: Has benefits  Treatment Modalities: Medication Management, Group therapy, Case management,  1 to 1 session with clinician, Psychoeducation, Recreational therapy.   Physician Treatment Plan for Primary Diagnosis: <principal problem not specified> Long Term Goal(s): Improvement in symptoms so as ready for discharge   Short Term Goals: Ability to identify changes in lifestyle to reduce recurrence of condition will improve Ability to verbalize feelings will improve Ability to demonstrate self-control will improve Ability to identify and develop effective coping behaviors will improve Ability to maintain clinical measurements within normal limits will improve Compliance with prescribed medications will  improve  Medication Management: Evaluate patient's response, side effects, and tolerance of medication regimen.  Therapeutic Interventions: 1 to 1 sessions, Unit Group sessions and Medication administration.  Evaluation of Outcomes: Not Met  Physician Treatment Plan for Secondary Diagnosis: Active Problems:   Schizophrenia, unspecified ([place])  Long Term Goal(s): Improvement in symptoms so as ready for discharge   Short Term Goals: Ability to identify changes in lifestyle to reduce recurrence of condition will improve Ability to verbalize feelings will improve Ability to demonstrate  self-control will improve Ability to identify and develop effective coping behaviors will improve Ability to maintain clinical measurements within normal limits will improve Compliance with prescribed medications will improve     Medication Management: Evaluate patient's response, side effects, and tolerance of medication regimen.  Therapeutic Interventions: 1 to 1 sessions, Unit Group sessions and Medication administration.  Evaluation of Outcomes: Not Met   RN Treatment Plan for Primary Diagnosis: <principal problem not specified> Long Term Goal(s): Knowledge of disease and therapeutic regimen to maintain health will improve  Short Term Goals: Ability to remain free from injury will improve, Ability to demonstrate self-control, Ability to participate in decision making will improve, Ability to verbalize feelings will improve, and Compliance with prescribed medications will improve  Medication Management: RN will administer medications as ordered by provider, will assess and evaluate patient's response and provide education to patient for prescribed medication. RN will report any adverse and/or side effects to prescribing provider.  Therapeutic Interventions: 1 on 1 counseling sessions, Psychoeducation, Medication administration, Evaluate responses to treatment, Monitor vital signs and CBGs as ordered, Perform/monitor CIWA, COWS, AIMS and Fall Risk screenings as ordered, Perform wound care treatments as ordered.  Evaluation of Outcomes: Not Met   LCSW Treatment Plan for Primary Diagnosis: <principal problem not specified> Long Term Goal(s): Safe transition to appropriate next level of care at discharge, Engage patient in therapeutic group addressing interpersonal concerns.  Short Term Goals: Engage patient in aftercare planning with referrals and resources, Increase social support, Increase ability to appropriately verbalize feelings, Facilitate acceptance of mental health diagnosis and  concerns, and Increase skills for wellness and recovery  Therapeutic Interventions: Assess for all discharge needs, 1 to 1 time with Social worker, Explore available resources and support systems, Assess for adequacy in community support network, Educate family and significant other(s) on suicide prevention, Complete Psychosocial Assessment, Interpersonal group therapy.  Evaluation of Outcomes: Not Met   Progress in Treatment: Attending groups: No. Participating in groups: No. Taking medication as prescribed: Yes. Toleration medication: Yes. Family/Significant other contact made: Yes, individual(s) contacted:  pt's mother Patient understands diagnosis: Yes. Discussing patient identified problems/goals with staff: Yes. Medical problems stabilized or resolved: Yes. Denies suicidal/homicidal ideation: Yes. Issues/concerns per patient self-inventory: No.   New problem(s) identified: No, Describe:  none  New Short Term/Long Term Goal(s): medication stabilization, elimination of SI thoughts, development of comprehensive mental wellness plan.    Patient Goals:  Did not attend  Discharge Plan or Barriers: To return home to live with mom. Referred to [facility]   Reason for Continuation of Hospitalization: Delusions  Hallucinations Medication stabilization  Estimated Length of Stay: 3-5 days  Attendees: Patient: Did not attend 07/24/2020 2:08 PM  Physician:  07/24/2020 2:08 PM  Nursing:  07/24/2020 2:08 PM  RN Care Manager: 07/24/2020 2:08 PM  Social Worker:  07/24/2020 2:08 PM  Recreational Therapist:  07/24/2020 2:08 PM  Other:  07/24/2020 2:08 PM  Other:  07/24/2020 2:08 PM  Other: 07/24/2020 2:08 PM  Scribe for Treatment Team: Vassie Moselle, LCSW 07/24/2020 2:08 PM

## 2020-07-24 NOTE — Progress Notes (Signed)
Recreation Therapy Notes  06.20.22 LRT started assessment patient.  Patient answered a few questions before stating he had something going on with his head.  Patient seemed to be responding to internal stimuli.  LRT will attempt to complete assessment with patient at a later time.     Caroll Rancher, LRT/CTRS   Lillia Abed, Camilla Skeen A 07/24/2020 2:15 PM

## 2020-07-25 DIAGNOSIS — F209 Schizophrenia, unspecified: Secondary | ICD-10-CM

## 2020-07-25 LAB — LITHIUM LEVEL: Lithium Lvl: 0.58 mmol/L — ABNORMAL LOW (ref 0.60–1.20)

## 2020-07-25 LAB — COMPREHENSIVE METABOLIC PANEL
ALT: 24 U/L (ref 0–44)
AST: 17 U/L (ref 15–41)
Albumin: 4.2 g/dL (ref 3.5–5.0)
Alkaline Phosphatase: 82 U/L (ref 38–126)
Anion gap: 4 — ABNORMAL LOW (ref 5–15)
BUN: 9 mg/dL (ref 6–20)
CO2: 24 mmol/L (ref 22–32)
Calcium: 9.6 mg/dL (ref 8.9–10.3)
Chloride: 110 mmol/L (ref 98–111)
Creatinine, Ser: 0.95 mg/dL (ref 0.61–1.24)
GFR, Estimated: >60 mL/min (ref >60)
Glucose, Bld: 103 mg/dL — ABNORMAL HIGH (ref 70–99)
Potassium: 4.3 mmol/L (ref 3.5–5.1)
Sodium: 138 mmol/L (ref 135–145)
Total Bilirubin: 0.3 mg/dL (ref 0.3–1.2)
Total Protein: 7 g/dL (ref 6.5–8.1)

## 2020-07-25 LAB — GLUCOSE, CAPILLARY: Glucose-Capillary: 91 mg/dL (ref 70–99)

## 2020-07-25 LAB — TSH: TSH: 0.473 u[IU]/mL (ref 0.350–4.500)

## 2020-07-25 MED ORDER — BENZTROPINE MESYLATE 0.5 MG PO TABS: 0.5000 mg | ORAL_TABLET | Freq: Two times a day (BID) | ORAL | Status: DC | PRN

## 2020-07-25 NOTE — Progress Notes (Signed)
Progress note    07/25/20 0742  Psych Admission Type (Psych Patients Only)  Admission Status Involuntary  Psychosocial Assessment  Patient Complaints Anxiety  Eye Contact Intense  Facial Expression Animated;Anxious  Affect Anxious;Preoccupied  Speech Logical/coherent  Interaction Assertive  Motor Activity Pacing;Slow  Appearance/Hygiene Improved  Behavior Characteristics Cooperative;Appropriate to situation;Anxious;Pacing  Mood Anxious;Preoccupied;Pleasant  Thought Process  Coherency WDL  Content Paranoia  Delusions Paranoid  Perception WDL  Hallucination None reported or observed  Judgment Poor  Confusion None  Danger to Self  Current suicidal ideation? Denies  Danger to Others  Danger to Others None reported or observed
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
## 2020-07-25 NOTE — Progress Notes (Signed)
[facility] MD Progress Note  07/25/2020 2:03 PM James [name]  MRN:  [id number]  Subjective:  James [name] is a 40 y.o. male with a history of schizophrenia, who was initially admitted for inpatient psychiatric hospitalization on 07/21/2020 for management of aggression and psychosis. The patient is currently on Hospital Day 4.   Chart Review from last 24 hours:  The patient's chart was reviewed and nursing notes were reviewed. The patient's case was discussed in multidisciplinary team meeting. Per nursing, he has had minimal interactions on the unit, has been pacing, and has been seen responding to internal stimuli. Per [facility] he has been compliant with scheduled medications and has not required PRNs.  Information Obtained Today During Patient Interview: The patient was seen and evaluated on the unit. On assessment today the patient reports that his mood is "normal" and he denies issues with impulse control or aggression. He states he has a good appetite and good sleep. He voices no physical complaints and denies medication side-effects. He denies SI, HI, AVH, paranoia, or first rank symptoms but he appears paranoid on exam and guarded during the interview. When questioned about ideas of reference, he states he occasionally gets "signals from the TV and radio" at home but denies having any on the unit. He is in agreement for an Abilify LAI injection.   Principal Problem: Schizophrenia, unspecified (H[facility]) Diagnosis: Principal Problem:   Schizophrenia, unspecified ([facility])  Total Time Spent in Direct Patient Care:  I personally spent 40 minutes on the unit in direct patient care. The direct patient care time included face-to-face time with the patient, reviewing the patient's chart, communicating with other professionals, and coordinating care. Greater than 50% of this time was spent in counseling or coordinating care with the patient regarding goals of hospitalization, psycho-education, and discharge  planning needs.  Past Psychiatric History: see admission H&P  Past Medical History:  Past Medical History:  Diagnosis Date   Gastroesophageal reflux disease 11/25/2017   History reviewed. No pertinent surgical history. Family History:  Family History  Problem Relation Age of Onset   Diabetes Mother    Diabetes Father    Prostate cancer Father    Family Psychiatric  History: see admission H&P  Social History:  Social History   Substance and Sexual Activity  Alcohol Use No   Alcohol/week: 0.0 standard drinks   Comment: rare alcohol     Social History   Substance and Sexual Activity  Drug Use No    Social History   Socioeconomic History   Marital status: Single    Spouse name: Not on file   Number of children: Not on file   Years of education: Not on file   Highest education level: Not on file  Occupational History   Not on file  Tobacco Use   Smoking status: Every Day    Packs/day: 0.50    Pack years: 0.00    Types: Cigarettes   Smokeless tobacco: Never  Vaping Use   Vaping Use: Never used  Substance and Sexual Activity   Alcohol use: No    Alcohol/week: 0.0 standard drinks    Comment: rare alcohol   Drug use: No   Sexual activity: Yes    Comment: male  Other Topics Concern   Not on file  Social History Narrative   Not on file   Social Determinants of Health   Financial Resource Strain: Not on file  Food Insecurity: Not on file  Transportation Needs: Not on file  Physical Activity: Not on file  Stress: Not on file  Social Connections: Not on file    Sleep: Good  Appetite:  Good  Current Medications: Current Facility-Administered Medications  Medication Dose Route Frequency Provider Last Rate Last Admin   ARIPiprazole (ABILIFY) tablet 10 mg  10 mg Oral Daily Antonieta Pert, MD   10 mg at 07/25/20 0742   ARIPiprazole ER (ABILIFY MAINTENA) injection 400 mg  400 mg Intramuscular Q28 days Antonieta Pert, MD   400 mg at 07/25/20 1118    doxepin (SINEQUAN) capsule 25 mg  25 mg Oral QHS PRN Antonieta Pert, MD       famotidine (PEPCID) tablet 20 mg  20 mg Oral BID Antonieta Pert, MD   20 mg at 07/25/20 0742   lithium carbonate (ESKALITH) CR tablet 450 mg  450 mg Oral BID Antonieta Pert, MD   450 mg at 07/25/20 0742   OLANZapine zydis (ZYPREXA) disintegrating tablet 10 mg  10 mg Oral Q8H PRN Antonieta Pert, MD       And   LORazepam (ATIVAN) tablet 2 mg  2 mg Oral Q6H PRN Antonieta Pert, MD       And   ziprasidone (GEODON) injection 20 mg  20 mg Intramuscular Q12H PRN Antonieta Pert, MD       metFORMIN (GLUCOPHAGE) tablet 500 mg  500 mg Oral BID WC Antonieta Pert, MD   500 mg at 07/25/20 0743   OLANZapine (ZYPREXA) tablet 10 mg  10 mg Oral Daily Antonieta Pert, MD   10 mg at 07/25/20 0742   OLANZapine (ZYPREXA) tablet 20 mg  20 mg Oral QHS Antonieta Pert, MD   20 mg at 07/24/20 2143    Lab Results:  Results for orders placed or performed during the hospital encounter of 07/21/20 (from the past 48 hour(s))  Glucose, capillary     Status: Abnormal   Collection Time: 07/24/20  5:39 AM  Result Value Ref Range   Glucose-Capillary 104 (H) 70 - 99 mg/dL    Comment: Glucose reference range applies only to samples taken after fasting for at least 8 hours.  Glucose, capillary     Status: None   Collection Time: 07/25/20  5:47 AM  Result Value Ref Range   Glucose-Capillary 91 70 - 99 mg/dL    Comment: Glucose reference range applies only to samples taken after fasting for at least 8 hours.   Comment 1 Notify RN   Lithium level     Status: Abnormal   Collection Time: 07/25/20  6:55 AM  Result Value Ref Range   Lithium Lvl 0.58 (L) 0.60 - 1.20 mmol/L    Comment: Performed at [facility], [address] [place], [place] [address]  TSH     Status: None   Collection Time: 07/25/20  6:55 AM  Result Value Ref Range   TSH 0.473 0.350 - 4.500 uIU/mL    Comment: Performed by a 3rd  Generation assay with a functional sensitivity of <=0.01 uIU/mL. Performed at [facility], [address], [place], [place] [address]   Comprehensive metabolic panel     Status: Abnormal   Collection Time: 07/25/20  6:55 AM  Result Value Ref Range   Sodium 138 135 - 145 mmol/L   Potassium 4.3 3.5 - 5.1 mmol/L   Chloride 110 98 - 111 mmol/L   CO2 24 22 - 32 mmol/L   Glucose, Bld 103 (  H) 70 - 99 mg/dL    Comment: Glucose reference range applies only to samples taken after fasting for at least 8 hours.   BUN 9 6 - 20 mg/dL   Creatinine, Ser 3.15 0.61 - 1.24 mg/dL   Calcium 9.6 8.9 - 17.6 mg/dL   Total Protein 7.0 6.5 - 8.1 g/dL   Albumin 4.2 3.5 - 5.0 g/dL   AST 17 15 - 41 U/L   ALT 24 0 - 44 U/L   Alkaline Phosphatase 82 38 - 126 U/L   Total Bilirubin 0.3 0.3 - 1.2 mg/dL   GFR, Estimated >16 >07 mL/min    Comment: (NOTE) Calculated using the CKD-EPI Creatinine Equation (2021)    Anion gap 4 (L) 5 - 15    Comment: Performed at [facility], [address] [place], [place] [address]    Blood Alcohol level:  Lab Results  Component Value Date   [facility] <10 07/16/2020   ETH <10 11/20/2019    Metabolic Disorder Labs: Lab Results  Component Value Date   HGBA1C 5.4 03/28/2020   MPG 120 11/23/2019   MPG 111.15 06/27/2018   Lab Results  Component Value Date   PROLACTIN 3.3 (L) 11/23/2019   Lab Results  Component Value Date   CHOL 132 07/22/2020   TRIG 80 07/22/2020   HDL 42 07/22/2020   CHOLHDL 3.1 07/22/2020   VLDL 16 07/22/2020   LDLCALC 74 07/22/2020   LDLCALC 86 11/23/2019    Physical Findings: AIMS: Facial and Oral Movements Muscles of Facial Expression: None, normal Lips and Perioral Area: None, normal Jaw: None, normal Tongue: None, normal,Extremity Movements Upper (arms, wrists, hands, fingers): None, normal Lower (legs, knees, ankles, toes): None, normal, Trunk Movements Neck, shoulders, hips: None, normal, Overall  Severity Severity of abnormal movements (highest score from questions above): None, normal Incapacitation due to abnormal movements: None, normal Patient's awareness of abnormal movements (rate only patient's report): No Awareness, Dental Status Current problems with teeth and/or dentures?: No Does patient usually wear dentures?: No  CIWA:    COWS:     Musculoskeletal: Strength & Muscle Tone: within normal limits Gait & Station: normal, steady Patient leans: N/A  Psychiatric Specialty Exam: Physical Exam Vitals reviewed.  HENT:     Head: Normocephalic.  Pulmonary:     Effort: Pulmonary effort is normal.  Neurological:     General: No focal deficit present.     Mental Status: He is alert.    Review of Systems  Respiratory:  Negative for shortness of breath.   Cardiovascular:  Negative for chest pain.  Gastrointestinal:  Negative for diarrhea, nausea and vomiting.   Blood pressure 117/77, pulse 94, temperature 98.2 F (36.8 C), temperature source Oral, resp. rate 18, height 5\' 9"  (1.753 m), weight 78 kg, SpO2 100 %.Body mass index is 25.4 kg/m.  General Appearance:  fair hygiene, casually dressed  Eye Contact:   fair - eyes darting about room on exam  Speech:  Clear and Coherent and Normal Rate  Volume:  Decreased  Mood:  Dysphoric  Affect:  Constricted, guarded  Thought Process:  Goal Directed but concrete  Orientation:  oriented to self, year, month and President  Thought Content:  denies AVH, paranoia, first rank symptoms or ideas of reference but body language is suggestive of internal preoccupation and paranoia  Suicidal Thoughts:   denied  Homicidal Thoughts:   denied  Memory:  Recent;   Fair  Judgement:  Impaired  Insight:  Lacking  Psychomotor Activity:  Increased  Concentration:  Concentration: Fair and Attention Span: Fair  Recall:  Poor  Fund of Knowledge:  Fair  Language:  Fair  Akathisia:  Negative  Assets:  Communication Skills Desire for  Improvement Resilience Social Support  ADL's:  Intact  Cognition:  WNL  Sleep:  Number of Hours: 7.5   Treatment Plan Summary: Diagnoses / Active Problems: Schizophrenia by hx  PLAN: Safety and Monitoring:  -- Involuntary admission to inpatient psychiatric unit for safety, stabilization and treatment  -- Daily contact with patient to assess and evaluate symptoms and progress in treatment  -- Patient's case to be discussed in multi-disciplinary team meeting  -- Observation Level : q15 minute checks  -- Vital signs:  q12 hours  -- Precautions: suicide  2. Psychiatric Diagnoses and Treatment:   Schizophrenia by hx (r/o schizoaffective d/o bipolar type)  -- Continue Abilify 10mg  daily while Abilify Maintenna LAI reaches steady state; Received Abilify maintenna 400mg  IM on 07/25/20   -- Continue Zyprexa 10mg  qam and 20mg  po qhs for psychosis  -- Continue Lithium 450mg  bid for mood stability (07/25/20: Li level 0.58, TSH 0.473; creatinine 0.95, Na+ 138)  -- Metabolic profile and EKG monitoring obtained while on an atypical antipsychotic (BMI: 25.40; Lipid Panel: cholesterol 132, triglycerides 80, HDL 42, LDL 74; HbgA1c: 5.4 on 03/28/20; QTc:46228ms)   -- Add Cogentin 0.5mg  bid PRN EPS  -- Encouraged patient to participate in unit milieu and in scheduled group therapies   -- Short Term Goals: Ability to identify and develop effective coping behaviors will improve, Ability to maintain clinical measurements within normal limits will improve, and Compliance with prescribed medications will improve  -- Long Term Goals: Improvement in symptoms so as ready for discharge  3. Medical Issues Being Addressed:   DM  -- Continue Metformin 500mg  bid   GERD  -- Continue Pepcid 20mg  bid   Justification for use of 2 antipsychotics:   Patient has tried and failed 3 or more antipsychotic monotherapy trials without sufficient improvement in symptoms or functioning. Previous monotherapy antipsychotic trials  include: Zyprexa, Abilify, Latuda.   4. Discharge Planning:   -- Social work and case management to assist with discharge planning and identification of hospital follow-up needs prior to discharge  -- Estimated LOS: 5-7 days  -- Discharge Concerns: Need to establish a safety plan; Medication compliance and effectiveness  -- Discharge Goals: Return home with outpatient referrals for mental health follow-up including medication management/psychotherapy   Comer LocketAmy E Trevious Rampey, MD, FAPA 07/25/2020, 2:03 PM

## 2020-07-25 NOTE — Progress Notes (Signed)
   07/25/20 0500  Sleep  Number of Hours 7.5

## 2020-07-25 NOTE — Progress Notes (Addendum)
Recreation Therapy Notes  Date: 6.21.22 Time: 1000 Location: 500 Hall Dayroom   Group Topic: Coping Skills   Goal Area(s) Addresses: Patient will define what a coping skill is. Patient will identify a positive coping skill for each letter of the alphabet. Patient will acknowledge benefit(s) of using learned coping skills post d/c.   Intervention: Worksheet   Activity: Coping A to Z. Patient asked to identify what a coping skill is and when they use them. Patients with Clinical research associate discussed healthy versus unhealthy coping skills. Next patients were given a blank worksheet titled "Coping Skills A-Z". Patients were given 20 minutes to brainstorm before ideas were presented to the large group. Patients and LRT debriefed on the importance of coping skill selection based on situation and how coping skills are subjective.    Education: Pharmacologist, Scientist, physiological, Discharge Planning.    Education Outcome: Acknowledges education/Verbalizes understanding/In group clarification offered   Clinical Observations/Feedback: Pt did not attend group session.     Caroll Rancher, LRT/CTRS     Lillia Abed, Manoj Enriquez A 07/25/2020 11:59 AM

## 2020-07-25 NOTE — Progress Notes (Signed)
Adult Psychoeducational Group Note  Date:  07/25/2020 Time:  8:26 PM  Group Topic/Focus:  Wrap-Up Group:   The focus of this group is to help patients review their daily goal of treatment and discuss progress on daily workbooks.  Participation Level:  Did Not Attend  Participation Quality:   Did Not Attend  Affect:  Did Not Attend  Cognitive:  Did Not Attend  Insight: None  Engagement in Group:  Did Not Attend  Modes of Intervention:  Did Not Attend  Additional Comments:  Pt did not attend evening wrap up group tonight.  Felipa Furnace 07/25/2020, 8:26 PM

## 2020-07-25 NOTE — Progress Notes (Signed)
Pt did not attend orientation group.  

## 2020-07-25 NOTE — Progress Notes (Signed)
   07/24/20 2200  Psych Admission Type (Psych Patients Only)  Admission Status Involuntary  Psychosocial Assessment  Patient Complaints None  Eye Contact Brief  Facial Expression Flat  Affect Flat  Speech Logical/coherent;Soft;Slow  Interaction Cautious;Forwards little;Minimal;No initiation  Motor Activity Slow  Appearance/Hygiene In scrubs  Behavior Characteristics Cooperative  Mood Preoccupied  Thought Process  Coherency WDL  Content WDL  Delusions None reported or observed  Perception WDL  Hallucination None reported or observed (Patient appears to be responding to internal stimuli)  Judgment Impaired  Confusion None  Danger to Self  Current suicidal ideation? Denies  Danger to Others  Danger to Others None reported or observed

## 2020-07-25 NOTE — Plan of Care (Signed)
  Problem: Education: Goal: Knowledge of [facility] Education information/materials will improve Outcome: Progressing Goal: Emotional status will improve Outcome: Progressing Goal: Mental status will improve Outcome: Progressing Goal: Verbalization of understanding the information provided will improve Outcome: Progressing   

## 2020-07-26 LAB — GLUCOSE, CAPILLARY: Glucose-Capillary: 93 mg/dL (ref 70–99)

## 2020-07-26 NOTE — Progress Notes (Signed)
Minimal interaction with me. Will shake his head answering yes of no. Denies S.I. Denies hallucinations but appears to be responding to internal stimuli walking up and down hall talking to self.

## 2020-07-26 NOTE — Progress Notes (Signed)
Recreation Therapy Notes  Date:  6.22.22 Time: 1000 Location: 500 Hall Dayroom  Group Topic: Stress Management  Goal Area(s) Addresses:  Patient will identify positive stress management techniques. Patient will identify benefits of using stress management post d/c.  Behavioral Response: None  Intervention: Stress Management  Activity : Meditation.  LRT played a meditation that focused on having love and kindness towards the self and others.  Patients were to listen and follow along as meditation played to fully engage in activity.   Education:  Stress Management, Discharge Planning.   Education Outcome: Acknowledges Education  Clinical Observations/Feedback:  Pt came into group, walked around for a bit then left. Pt did not return.    Caroll Rancher, LRT/CTRS         Caroll Rancher A 07/26/2020 12:03 PM

## 2020-07-26 NOTE — [facility] Group Notes (Signed)
Type of Therapy and Topic:  Group Therapy:  Healthy and Unhealthy Supports   Participation Level:  Active    Description of Group:  Patients in this group were introduced to the idea of adding a variety of healthy supports to address the various needs in their lives. Patients discussed what additional healthy supports could be helpful in their recovery and wellness after discharge in order to prevent future hospitalizations.   An emphasis was placed on using counselor, doctor, therapy groups, 12-step groups, and problem-specific support groups to expand supports.  They also worked as a group on developing a specific plan for several patients to deal with unhealthy supports through boundary-setting, psychoeducation with loved ones, and even termination of relationships.   Therapeutic Goals:               1)  discuss importance of adding supports to stay well once out of the hospital             2)  compare healthy versus unhealthy supports and identify some examples of each             3)  generate ideas and descriptions of healthy supports that can be added             4)  offer mutual support about how to address unhealthy supports             5)  encourage active participation in and adherence to discharge plan               Summary of Patient Progress: Worksheets were provided and patient was given the opportunity to ask questions.    Therapeutic Modalities:   Motivational Interviewing Brief Solution-Focused Therapy 

## 2020-07-26 NOTE — Progress Notes (Signed)
Pt continues to pace, pt stated voices better    07/26/20 2100  Psych Admission Type (Psych Patients Only)  Admission Status Involuntary  Psychosocial Assessment  Patient Complaints Suspiciousness  Eye Contact Brief  Facial Expression Anxious  Affect Appropriate to circumstance  Speech Logical/coherent  Interaction Cautious;Guarded  Motor Activity Fidgety  Appearance/Hygiene Unremarkable  Behavior Characteristics Cooperative  Mood Suspicious;Preoccupied  Thought Process  Coherency Disorganized  Content UTA  Delusions Paranoid;Persecutory  Perception UTA  Hallucination None reported or observed  Judgment Limited  Confusion None  Danger to Self  Current suicidal ideation? Denies  Danger to Others  Danger to Others None reported or observed

## 2020-07-26 NOTE — Progress Notes (Signed)
[facility] MD Progress Note  07/26/2020 4:47 PM James [name]  MRN:  [id number]  Subjective:  James [name] is a 40 y.o. male with a history of schizophrenia, who was initially admitted for inpatient psychiatric hospitalization on 07/21/2020 for management of aggression and psychosis. The patient is currently on Hospital Day 5.   Chart Review from last 24 hours:  The patient's chart was reviewed and nursing notes were reviewed. The patient's case was discussed in multidisciplinary team meeting. Per nursing, he has been seen pacing and talking to himself on the unit. He did not attend groups. He has had no evidence of aggression or behavioral outbursts noted. Per [facility] he has been compliant with scheduled medications and did not require PRNs.  Information Obtained Today During Patient Interview: The patient was seen and evaluated on the unit. He states his mood is "good" today and he voices no physical complaints. He states his sleep has been "good" and his appetite has been "wonderful." He denies medication side-effects and denies SI or HI. He denies AVH but has been seen at times pacing and talking to himself on the unit. He denies paranoia, ideas of reference, or first rank symptoms. He states he has talked to his family on the phone and is hopeful to return to live with family after discharge. We discussed that it will take time for his LAI to reach steady state after he got his injection yesterday. Psychoeducation provided regarding his medications.   Principal Problem: Schizophrenia, unspecified (HCC) Diagnosis: Principal Problem:   Schizophrenia, unspecified (HCC)  Total Time Spent in Direct Patient Care:  I personally spent 30 minutes on the unit in direct patient care. The direct patient care time included face-to-face time with the patient, reviewing the patient's chart, communicating with other professionals, and coordinating care. Greater than 50% of this time was spent in counseling or  coordinating care with the patient regarding goals of hospitalization, psycho-education, and discharge planning needs.  Past Psychiatric History: see admission H&P  Past Medical History:  Past Medical History:  Diagnosis Date   Gastroesophageal reflux disease 11/25/2017   History reviewed. No pertinent surgical history. Family History:  Family History  Problem Relation Age of Onset   Diabetes Mother    Diabetes Father    Prostate cancer Father    Family Psychiatric  History: see admission H&P  Social History:  Social History   Substance and Sexual Activity  Alcohol Use No   Alcohol/week: 0.0 standard drinks   Comment: rare alcohol     Social History   Substance and Sexual Activity  Drug Use No    Social History   Socioeconomic History   Marital status: Single    Spouse name: Not on file   Number of children: Not on file   Years of education: Not on file   Highest education level: Not on file  Occupational History   Not on file  Tobacco Use   Smoking status: Every Day    Packs/day: 0.50    Pack years: 0.00    Types: Cigarettes   Smokeless tobacco: Never  Vaping Use   Vaping Use: Never used  Substance and Sexual Activity   Alcohol use: No    Alcohol/week: 0.0 standard drinks    Comment: rare alcohol   Drug use: No   Sexual activity: Yes    Comment: male  Other Topics Concern   Not on file  Social History Narrative   Not on file   Social Determinants  of Health   Financial Resource Strain: Not on file  Food Insecurity: Not on file  Transportation Needs: Not on file  Physical Activity: Not on file  Stress: Not on file  Social Connections: Not on file    Sleep: Good  Appetite:  Good  Current Medications: Current Facility-Administered Medications  Medication Dose Route Frequency Provider Last Rate Last Admin   ARIPiprazole (ABILIFY) tablet 10 mg  10 mg Oral Daily Antonieta Pert, MD   10 mg at 07/26/20 0814   ARIPiprazole ER (ABILIFY  MAINTENA) injection 400 mg  400 mg Intramuscular Q28 days Antonieta Pert, MD   400 mg at 07/25/20 1118   benztropine (COGENTIN) tablet 0.5 mg  0.5 mg Oral BID PRN Comer Locket, MD       doxepin (SINEQUAN) capsule 25 mg  25 mg Oral QHS PRN Antonieta Pert, MD       famotidine (PEPCID) tablet 20 mg  20 mg Oral BID Antonieta Pert, MD   20 mg at 07/26/20 0814   lithium carbonate (ESKALITH) CR tablet 450 mg  450 mg Oral BID Antonieta Pert, MD   450 mg at 07/26/20 0814   OLANZapine zydis (ZYPREXA) disintegrating tablet 10 mg  10 mg Oral Q8H PRN Antonieta Pert, MD       And   LORazepam (ATIVAN) tablet 2 mg  2 mg Oral Q6H PRN Antonieta Pert, MD       And   ziprasidone (GEODON) injection 20 mg  20 mg Intramuscular Q12H PRN Antonieta Pert, MD       metFORMIN (GLUCOPHAGE) tablet 500 mg  500 mg Oral BID WC Antonieta Pert, MD   500 mg at 07/26/20 0814   OLANZapine (ZYPREXA) tablet 10 mg  10 mg Oral Daily Antonieta Pert, MD   10 mg at 07/26/20 0814   OLANZapine (ZYPREXA) tablet 20 mg  20 mg Oral QHS Antonieta Pert, MD   20 mg at 07/25/20 2057    Lab Results:  Results for orders placed or performed during the hospital encounter of 07/21/20 (from the past 48 hour(s))  Glucose, capillary     Status: None   Collection Time: 07/25/20  5:47 AM  Result Value Ref Range   Glucose-Capillary 91 70 - 99 mg/dL    Comment: Glucose reference range applies only to samples taken after fasting for at least 8 hours.   Comment 1 Notify RN   Lithium level     Status: Abnormal   Collection Time: 07/25/20  6:55 AM  Result Value Ref Range   Lithium Lvl 0.58 (L) 0.60 - 1.20 mmol/L    Comment: Performed at [facility], [address] [place], [place] [address]  TSH     Status: None   Collection Time: 07/25/20  6:55 AM  Result Value Ref Range   TSH 0.473 0.350 - 4.500 uIU/mL    Comment: Performed by a 3rd Generation assay with a functional sensitivity of <=0.01  uIU/mL. Performed at [facility], [address], [place], [place] [address]   Comprehensive metabolic panel     Status: Abnormal   Collection Time: 07/25/20  6:55 AM  Result Value Ref Range   Sodium 138 135 - 145 mmol/L   Potassium 4.3 3.5 - 5.1 mmol/L   Chloride 110 98 - 111 mmol/L   CO2 24 22 - 32 mmol/L   Glucose, Bld 103 (H) 70 - 99 mg/dL  Comment: Glucose reference range applies only to samples taken after fasting for at least 8 hours.   BUN 9 6 - 20 mg/dL   Creatinine, Ser 1.610.95 0.61 - 1.24 mg/dL   Calcium 9.6 8.9 - 09.610.3 mg/dL   Total Protein 7.0 6.5 - 8.1 g/dL   Albumin 4.2 3.5 - 5.0 g/dL   AST 17 15 - 41 U/L   ALT 24 0 - 44 U/L   Alkaline Phosphatase 82 38 - 126 U/L   Total Bilirubin 0.3 0.3 - 1.2 mg/dL   GFR, Estimated >04>60 >54>60 mL/min    Comment: (NOTE) Calculated using the CKD-EPI Creatinine Equation (2021)    Anion gap 4 (L) 5 - 15    Comment: Performed at [facility], [address] [place], [place] [id number]  Glucose, capillary     Status: None   Collection Time: 07/26/20  6:29 AM  Result Value Ref Range   Glucose-Capillary 93 70 - 99 mg/dL    Comment: Glucose reference range applies only to samples taken after fasting for at least 8 hours.    Blood Alcohol level:  Lab Results  Component Value Date   ETH <10 07/16/2020   ETH <10 11/20/2019    Metabolic Disorder Labs: Lab Results  Component Value Date   HGBA1C 5.4 03/28/2020   MPG 120 11/23/2019   MPG 111.15 06/27/2018   Lab Results  Component Value Date   PROLACTIN 3.3 (L) 11/23/2019   Lab Results  Component Value Date   CHOL 132 07/22/2020   TRIG 80 07/22/2020   HDL 42 07/22/2020   CHOLHDL 3.1 07/22/2020   VLDL 16 07/22/2020   LDLCALC 74 07/22/2020   LDLCALC 86 11/23/2019    Physical Findings: AIMS: Facial and Oral Movements Muscles of Facial Expression: None, normal Lips and Perioral Area: None, normal Jaw: None, normal Tongue: None,  normal,Extremity Movements Upper (arms, wrists, hands, fingers): None, normal Lower (legs, knees, ankles, toes): None, normal, Trunk Movements Neck, shoulders, hips: None, normal, Overall Severity Severity of abnormal movements (highest score from questions above): None, normal Incapacitation due to abnormal movements: None, normal Patient's awareness of abnormal movements (rate only patient's report): No Awareness, Dental Status Current problems with teeth and/or dentures?: No Does patient usually wear dentures?: No  CIWA:    COWS:     Musculoskeletal: Strength & Muscle Tone: within normal limits Gait & Station: normal, steady Patient leans: N/A  Psychiatric Specialty Exam: Physical Exam Vitals reviewed.  HENT:     Head: Normocephalic.  Pulmonary:     Effort: Pulmonary effort is normal.  Neurological:     General: No focal deficit present.     Mental Status: He is alert.    Review of Systems  Respiratory:  Negative for shortness of breath.   Cardiovascular:  Negative for chest pain.  Gastrointestinal:  Negative for diarrhea, nausea and vomiting.   Blood pressure 120/87, pulse (!) 114, temperature 98.4 F (36.9 C), temperature source Oral, resp. rate 18, height 5\' 9"  (1.753 m), weight 78 kg, SpO2 100 %.Body mass index is 25.4 kg/m.  General Appearance:  fair hygiene, casually dressed  Eye Contact:  Fair  Speech:  Clear and Coherent and Normal Rate  Volume:  Decreased  Mood:  Aloof  Affect:  Constricted, guarded  Thought Process:  Superficially goal directed but concrete  Orientation:  oriented to self, year, month and President  Thought Content:  denies AVH, paranoia, first rank symptoms or ideas of reference but at  conclusion of interview seen pacing and talking to himself on the unit  Suicidal Thoughts:   denied  Homicidal Thoughts:   denied  Memory:  Recent;   Fair  Judgement:  Fair  Insight:  Lacking  Psychomotor Activity:  Increased - pacing at times on the unit   Concentration:  Concentration: Fair and Attention Span: Fair  Recall:  Poor  Fund of Knowledge:  Fair  Language:  Fair  Akathisia:  Negative  Assets:  Communication Skills Desire for Improvement Resilience Social Support  ADL's:  Intact  Cognition:  WNL  Sleep:  Number of Hours: 8   Treatment Plan Summary: Diagnoses / Active Problems: Schizophrenia by hx  PLAN: Safety and Monitoring:  -- Involuntary admission to inpatient psychiatric unit for safety, stabilization and treatment  -- Daily contact with patient to assess and evaluate symptoms and progress in treatment  -- Patient's case to be discussed in multi-disciplinary team meeting  -- Observation Level : q15 minute checks  -- Vital signs:  q12 hours  -- Precautions: suicide  2. Psychiatric Diagnoses and Treatment:   Schizophrenia by hx (r/o schizoaffective d/o bipolar type)  -- Continue Abilify 10mg  daily while Abilify Maintenna LAI reaches steady state; Received Abilify maintenna 400mg  IM on 07/25/20   -- Continue Zyprexa 10mg  qam and 20mg  po qhs for psychosis  -- Continue Lithium 450mg  bid for mood stability (07/25/20: Li level 0.58, TSH 0.473; creatinine 0.95, Na+ 138)  -- Metabolic profile and EKG monitoring obtained while on an atypical antipsychotic (BMI: 25.40; Lipid Panel: cholesterol 132, triglycerides 80, HDL 42, LDL 74; HbgA1c: 5.4 on 03/28/20; QTc:455ms) Will recheck EKG tomorrow for monitoring of QTc while on 2 antipsychotics  -- Continue Cogentin 0.5mg  bid PRN EPS  -- Encouraged patient to participate in unit milieu and in scheduled group therapies   -- Short Term Goals: Ability to identify and develop effective coping behaviors will improve, Ability to maintain clinical measurements within normal limits will improve, and Compliance with prescribed medications will improve  -- Long Term Goals: Improvement in symptoms so as ready for discharge  3. Medical Issues Being Addressed:   DM  -- Continue Metformin 500mg   bid   GERD  -- Continue Pepcid 20mg  bid   Justification for use of 2 antipsychotics:   Patient has tried and failed 3 or more antipsychotic monotherapy trials without sufficient improvement in symptoms or functioning. Previous monotherapy antipsychotic trials include: Zyprexa, Abilify, Latuda.   4. Discharge Planning:   -- Social work and case management to assist with discharge planning and identification of hospital follow-up needs prior to discharge - attempting to reach family to see if he is near clinical baseline  -- Estimated LOS: 3-4 days  -- Discharge Concerns: Need to establish a safety plan; Medication compliance and effectiveness  -- Discharge Goals: Return home with outpatient referrals for mental health follow-up including medication management/psychotherapy   , MD, FAPA 07/26/2020, 4:47 PM

## 2020-07-27 LAB — GLUCOSE, CAPILLARY: Glucose-Capillary: 103 mg/dL — ABNORMAL HIGH (ref 70–99)

## 2020-07-27 NOTE — [facility] Group Notes (Signed)
[facility] Group Notes:  (Nursing/MHT/Case Management/Adjunct)  Date:  07/27/2020  Time:  5:29 PM  Type of Therapy:  Group Therapy  Participation Level:  Did Not Attend  Participation Quality:     Affect:    Cognitive:    Insight:    Engagement in Group:    Modes of Intervention:  Education  Summary of Progress/Problems:  Did not attend group despite staff invitation.    Norm Parcel Jasn Xia 07/27/2020, 5:29 PM

## 2020-07-27 NOTE — Care Management Important Message (Signed)
Medicare IM given to Meredith Rumbley, LCSW to give to the patient.  

## 2020-07-27 NOTE — Progress Notes (Signed)
   07/27/20 2300  Psych Admission Type (Psych Patients Only)  Admission Status Involuntary  Psychosocial Assessment  Patient Complaints None  Eye Contact Brief  Facial Expression Anxious  Affect Appropriate to circumstance  Speech Logical/coherent  Interaction Cautious;Guarded  Motor Activity Fidgety  Appearance/Hygiene Unremarkable  Behavior Characteristics Cooperative;Calm  Mood Suspicious;Preoccupied  Thought Process  Coherency Disorganized  Content UTA  Delusions Paranoid  Perception UTA  Hallucination None reported or observed  Judgment Limited  Confusion None  Danger to Self  Current suicidal ideation? Denies  Danger to Others  Danger to Others None reported or observed

## 2020-07-27 NOTE — Progress Notes (Signed)
Recreation Therapy Notes  Date: 6.23.22 Time: 1000 Location: 500 Hall Dayroom   Group Topic: Self-esteem  Goal Area(s) Addresses:  Patient will identify and write at least one positive trait about themself. Patient will acknowledge the benefit of healthy self-esteem. Patient will endorse understanding of ways to increase self-esteem.    Intervention: Personalized Plate- printed license plate template, markers or colored pencils   Activity:  LRT began group session with open dialogue asking the patients to define self-esteem and  identify what influences self esteem.  Patients were then instructed to design a personalized license plate, with words and drawings, representing positive things about themselves. Pts were encouraged to include favorites, things they are proud of, what they enjoy doing, and dreams for their future. If a patient had a life motto or a meaningful phase that expressed their life values, pt's were asked to incorporate that into their design as well. Patients were given the opportunity to share their completed work with the group.    Education: LRT educated patients on the importance of healthy self-esteem and ways to build self-esteem. LRT addressed discharge planning reviewing positive coping skills and healthy support systems.  Education Outcome: Acknowledges education/In group clarification offered   Clinical Observations/Feedback: Pt did not attend group session.   Caroll Rancher, LRT/CTRS        Caroll Rancher A 07/27/2020 12:17 PM

## 2020-07-27 NOTE — Progress Notes (Signed)
[facility] MD Progress Note  07/27/2020 6:05 PM SKIPPY MARHEFKA  MRN:  [id number]  Subjective:  James [name] is a 40 y.o. male with a history of schizophrenia, who was initially admitted for inpatient psychiatric hospitalization on 07/21/2020 for management of aggression and psychosis. The patient is currently on Hospital Day 6.   Chart Review from last 24 hours:  The patient's chart was reviewed and nursing notes were reviewed. The patient's case was discussed in multidisciplinary team meeting. Per nursing, the patient has not had behavioral issues. He continues to pace at times on the unit but has endorsed improvement in [facility] to staff. He attended some groups. Per [facility] he was compliant with scheduled medications and required no PRNs.   Information Obtained Today During Patient Interview: The patient was seen and evaluated on the unit. He states his mood is "good" and he questions when he will be able to go home. I advised that social work is contacting his guardian about discharge planning. He denies medication side-effects and voices no physical complaints. He denies AVH, paranoia, ideas of reference, or first rank symptoms. He denies SI or HI. He reports good sleep and appetite.   Principal Problem: Schizophrenia, unspecified (HCC) Diagnosis: Principal Problem:   Schizophrenia, unspecified (HCC)  Total Time Spent in Direct Patient Care:  I personally spent 25 minutes on the unit in direct patient care. The direct patient care time included face-to-face time with the patient, reviewing the patient's chart, communicating with other professionals, and coordinating care. Greater than 50% of this time was spent in counseling or coordinating care with the patient regarding goals of hospitalization, psycho-education, and discharge planning needs.  Past Psychiatric History: see admission H&P  Past Medical History:  Past Medical History:  Diagnosis Date   Gastroesophageal reflux disease 11/25/2017    History reviewed. No pertinent surgical history. Family History:  Family History  Problem Relation Age of Onset   Diabetes Mother    Diabetes Father    Prostate cancer Father    Family Psychiatric  History: see admission H&P  Social History:  Social History   Substance and Sexual Activity  Alcohol Use No   Alcohol/week: 0.0 standard drinks   Comment: rare alcohol     Social History   Substance and Sexual Activity  Drug Use No    Social History   Socioeconomic History   Marital status: Single    Spouse name: Not on file   Number of children: Not on file   Years of education: Not on file   Highest education level: Not on file  Occupational History   Not on file  Tobacco Use   Smoking status: Every Day    Packs/day: 0.50    Pack years: 0.00    Types: Cigarettes   Smokeless tobacco: Never  Vaping Use   Vaping Use: Never used  Substance and Sexual Activity   Alcohol use: No    Alcohol/week: 0.0 standard drinks    Comment: rare alcohol   Drug use: No   Sexual activity: Yes    Comment: male  Other Topics Concern   Not on file  Social History Narrative   Not on file   Social Determinants of Health   Financial Resource Strain: Not on file  Food Insecurity: Not on file  Transportation Needs: Not on file  Physical Activity: Not on file  Stress: Not on file  Social Connections: Not on file    Sleep: Good  Appetite:  Good  Current  Medications: Current Facility-Administered Medications  Medication Dose Route Frequency Provider Last Rate Last Admin   ARIPiprazole (ABILIFY) tablet 10 mg  10 mg Oral Daily Antonieta Pertlary, Greg Lawson, MD   10 mg at 07/27/20 0933   ARIPiprazole ER (ABILIFY MAINTENA) injection 400 mg  400 mg Intramuscular Q28 days Antonieta Pertlary, Greg Lawson, MD   400 mg at 07/25/20 1118   benztropine (COGENTIN) tablet 0.5 mg  0.5 mg Oral BID PRN Comer LocketSingleton, Lemonte Al E, MD       doxepin (SINEQUAN) capsule 25 mg  25 mg Oral QHS PRN Antonieta Pertlary, Greg Lawson, MD        famotidine (PEPCID) tablet 20 mg  20 mg Oral BID Antonieta Pertlary, Greg Lawson, MD   20 mg at 07/27/20 1800   lithium carbonate (ESKALITH) CR tablet 450 mg  450 mg Oral BID Antonieta Pertlary, Greg Lawson, MD   450 mg at 07/27/20 1800   OLANZapine zydis (ZYPREXA) disintegrating tablet 10 mg  10 mg Oral Q8H PRN Antonieta Pertlary, Greg Lawson, MD       And   LORazepam (ATIVAN) tablet 2 mg  2 mg Oral Q6H PRN Antonieta Pertlary, Greg Lawson, MD       And   ziprasidone (GEODON) injection 20 mg  20 mg Intramuscular Q12H PRN Antonieta Pertlary, Greg Lawson, MD       metFORMIN (GLUCOPHAGE) tablet 500 mg  500 mg Oral BID WC Antonieta Pertlary, Greg Lawson, MD   500 mg at 07/27/20 1800   OLANZapine (ZYPREXA) tablet 10 mg  10 mg Oral Daily Antonieta Pertlary, Greg Lawson, MD   10 mg at 07/27/20 0934   OLANZapine (ZYPREXA) tablet 20 mg  20 mg Oral QHS Antonieta Pertlary, Greg Lawson, MD   20 mg at 07/26/20 2051    Lab Results:  Results for orders placed or performed during the hospital encounter of 07/21/20 (from the past 48 hour(s))  Glucose, capillary     Status: None   Collection Time: 07/26/20  6:29 AM  Result Value Ref Range   Glucose-Capillary 93 70 - 99 mg/dL    Comment: Glucose reference range applies only to samples taken after fasting for at least 8 hours.  Glucose, capillary     Status: Abnormal   Collection Time: 07/27/20  5:45 AM  Result Value Ref Range   Glucose-Capillary 103 (H) 70 - 99 mg/dL    Comment: Glucose reference range applies only to samples taken after fasting for at least 8 hours.    Blood Alcohol level:  Lab Results  Component Value Date   ETH <10 07/16/2020   ETH <10 11/20/2019    Metabolic Disorder Labs: Lab Results  Component Value Date   HGBA1C 5.4 03/28/2020   MPG 120 11/23/2019   MPG 111.15 06/27/2018   Lab Results  Component Value Date   PROLACTIN 3.3 (L) 11/23/2019   Lab Results  Component Value Date   CHOL 132 07/22/2020   TRIG 80 07/22/2020   HDL 42 07/22/2020   CHOLHDL 3.1 07/22/2020   VLDL 16 07/22/2020   LDLCALC 74 07/22/2020   LDLCALC  86 11/23/2019    Physical Findings: AIMS: Facial and Oral Movements Muscles of Facial Expression: None, normal Lips and Perioral Area: None, normal Jaw: None, normal Tongue: None, normal,Extremity Movements Upper (arms, wrists, hands, fingers): None, normal Lower (legs, knees, ankles, toes): None, normal, Trunk Movements Neck, shoulders, hips: None, normal, Overall Severity Severity of abnormal movements (highest score from questions above): None, normal Incapacitation due to abnormal movements: None, normal Patient's awareness of abnormal movements (  rate only patient's report): No Awareness, Dental Status Current problems with teeth and/or dentures?: No Does patient usually wear dentures?: No  CIWA:    COWS:     Musculoskeletal: Strength & Muscle Tone: within normal limits Gait & Station: normal, steady Patient leans: N/A  Psychiatric Specialty Exam: Physical Exam Vitals reviewed.  HENT:     Head: Normocephalic.  Pulmonary:     Effort: Pulmonary effort is normal.  Neurological:     General: No focal deficit present.     Mental Status: He is alert.    Review of Systems  Respiratory:  Negative for shortness of breath.   Cardiovascular:  Negative for chest pain.  Gastrointestinal:  Negative for diarrhea, nausea and vomiting.   Blood pressure 112/85, pulse (!) 116, temperature 98.2 F (36.8 C), temperature source Oral, resp. rate 18, height 5\' 9"  (1.753 m), weight 78 kg, SpO2 100 %.Body mass index is 25.4 kg/m.  General Appearance:  fair hygiene, casually dressed  Eye Contact:  Good  Speech:  Clear and Coherent and Normal Rate  Volume:  Normal  Mood:  Aloof  Affect:  Constricted but less guarded  Thought Process:  Superficially goal directed but concrete  Orientation:  oriented to self, year, month and city  Thought Content:  denies AVH, paranoia, first rank symptoms or ideas of reference - appears less paranoid on exam  Suicidal Thoughts:   denied  Homicidal  Thoughts:   denied  Memory:  Recent;   Fair  Judgement:  Fair  Insight:  Lacking  Psychomotor Activity:  AIMS 0; no cogwheeling, no stiffness, no tremor  Concentration:  Concentration: Fair and Attention Span: Fair  Recall:  Poor  Fund of Knowledge:  Fair  Language:  Fair  Akathisia:  Negative  Assets:  Communication Skills Desire for Improvement Resilience Social Support  ADL's:  Intact  Cognition:  WNL  Sleep:  Number of Hours: 7.25   Treatment Plan Summary: Diagnoses / Active Problems: Schizophrenia by hx  PLAN: Safety and Monitoring:  -- Involuntary admission to inpatient psychiatric unit for safety, stabilization and treatment  -- Daily contact with patient to assess and evaluate symptoms and progress in treatment  -- Patient's case to be discussed in multi-disciplinary team meeting  -- Observation Level : q15 minute checks  -- Vital signs:  q12 hours  -- Precautions: suicide  2. Psychiatric Diagnoses and Treatment:   Schizophrenia by hx (r/o schizoaffective d/o bipolar type)  -- Continue Abilify 10mg  daily for 14 days while Abilify Maintenna LAI reaches steady state; Received Abilify maintenna 400mg  IM on 07/25/20   -- Continue Zyprexa 10mg  qam and 20mg  po qhs for psychosis  -- Continue Lithium 450mg  bid for mood stability (07/25/20: Li level 0.58, TSH 0.473; creatinine 0.95, Na+ 138)  -- Metabolic profile and EKG monitoring obtained while on an atypical antipsychotic (BMI: 25.40; Lipid Panel: cholesterol 132, triglycerides 80, HDL 42, LDL 74; HbgA1c: 5.4 on 03/28/20; QTc:468ms) Recheck EKG shows QTc NSR  -- Continue Cogentin 0.5mg  bid PRN EPS  -- Encouraged patient to participate in unit milieu and in scheduled group therapies   -- Short Term Goals: Ability to identify and develop effective coping behaviors will improve, Ability to maintain clinical measurements within normal limits will improve, and Compliance with prescribed medications will improve  -- Long Term  Goals: Improvement in symptoms so as ready for discharge  3. Medical Issues Being Addressed:   DM  -- Continue Metformin 500mg  bid   GERD  --  Continue Pepcid 20mg  bid   Justification for use of 2 antipsychotics:   Patient has tried and failed 3 or more antipsychotic monotherapy trials without sufficient improvement in symptoms or functioning. Previous monotherapy antipsychotic trials include: Zyprexa, Abilify, Latuda.   4. Discharge Planning:   -- Social work and case management to assist with discharge planning and identification of hospital follow-up needs prior to discharge - attempting to reach family to see if he is near clinical baseline  -- Estimated LOS: 1-2 days  -- Discharge Concerns: Need to establish a safety plan; Medication compliance and effectiveness  -- Discharge Goals: Return home with outpatient referrals for mental health follow-up including medication management/psychotherapy   , MD, FAPA 07/27/2020, 6:05 PM

## 2020-07-27 NOTE — BHH Counselor (Signed)
CSW left  HIPAA compliant voicemail with this patients mother/legal guardian Willson Lipa [phone]) to discuss discharge plans for this patient.      Ruthann Cancer MSW, LCSW Clincal Social Worker  [facility]

## 2020-07-27 NOTE — Progress Notes (Signed)
Pt has been pleasant and cooperative this shift.  He is much more appropriate than on previous shifts.  He has attended groups with good participation  He denies all today.   A:  Support, education, and encouragement provided as appropriate to situation.  Medications administered per MD order.  Level 3 checks continued for safety.  EKG completed and placed on the chart.   R:  Pt receptive to measures; Safety maintained.    07/27/20 0800  Psych Admission Type (Psych Patients Only)  Admission Status Involuntary  Psychosocial Assessment  Patient Complaints None  Eye Contact Fair  Facial Expression Anxious  Affect Appropriate to circumstance  Speech Logical/coherent  Interaction Cautious;Guarded  Appearance/Hygiene Unremarkable  Behavior Characteristics Cooperative;Calm;Appropriate to situation  Mood Depressed;Anxious  Thought Process  Coherency Disorganized  Content UTA  Delusions Other (Comment) (Pt's conversation today did not reveal any delusions as patient is rather guarded.)  Perception UTA  Hallucination None reported or observed  Judgment Limited  Confusion None  Danger to Self  Current suicidal ideation? Denies  Danger to Others  Danger to Others None reported or observed      COVID-19 Daily Checkoff  Have you had a fever (temp > 37.80C/100F)  in the past 24 hours?  No  If you have had runny nose, nasal congestion, sneezing in the past 24 hours, has it worsened? No  COVID-19 EXPOSURE  Have you traveled outside the state in the past 14 days? No  Have you been in contact with someone with a confirmed diagnosis of COVID-19 or PUI in the past 14 days without wearing appropriate PPE? No  Have you been living in the same home as a person with confirmed diagnosis of COVID-19 or a PUI (household contact)? No  Have you been diagnosed with COVID-19? No

## 2020-07-27 NOTE — Progress Notes (Signed)
Pt did not attend orientation group.  

## 2020-07-27 NOTE — BHH Group Notes (Signed)
Adult Psychoeducational Group Note  Date:  07/27/2020 Time:  8:26 PM  Group Topic/Focus:  Making Healthy Choices:   The focus of this group is to help patients identify negative/unhealthy choices they were using prior to admission and identify positive/healthier coping strategies to replace them upon discharge. Personal Choices and Values:   The focus of this group is to help patients assess and explore the importance of values in their lives, how their values affect their decisions, how they express their values and what opposes their expression.  Participation Level:  Active  Participation Quality:  Appropriate  Affect:  Appropriate  Cognitive:  Alert  Insight: Lacking  Engagement in Group:  Lacking  Modes of Intervention:  Discussion  Additional Comments  James [name] 07/27/2020, 8:26 PM

## 2020-07-27 NOTE — BHH Counselor (Signed)
CSW spoke to this patients mother/legal guardian James [name] [phone]) who states that when this patient is at his baseline he does tend to talk to himself occasionally.   Pt's mother and daughter-in-law stated they planned to go out of town this weekend to visit family members in other areas of the state and would not return until 08/04/20. CSW explained that this pt would most likely not be able to stay in the hospital until then and requested alternative living for him at discharge. Pt's family states that they do not have anyone else he could stay with. CSW inquired if this pt could travel with them if he discharges tomorrow, in which they stated they did not want him to go with them.   Pt's family requested he be sent for longer term treatment. CSW explained that there is no longer term treatment he would qualify for and reported that this pt has been referred for ACTT services.     Ruthann Cancer MSW, LCSW Clincal Social Worker  [facility]

## 2020-07-27 NOTE — BHH Group Notes (Signed)
Occupational Therapy Group Note Date: 07/27/2020 Group Topic/Focus:  Feelings Management  Group Description: Group encouraged increased participation and engagement through discussion focused on STRENGTHS. Patients were encouraged to fill out a worksheet to structure discussion, that included questions identifying strengths within relationships, school/profession, and personal fulfillment/leisure. Discussion followed with patients sharing their responses and highlighting their own personal strengths.   Therapeutic Goals: Identify strengths vs weaknesses Discuss and identify ways we can highlight our strengths Participation Level: Patient did not attend OT group session despite personal invitation.    Plan: Continue to engage patient in OT groups 2 - 3x/week.  07/27/2020  Fransisca Shawn, MOT, OTR/L  

## 2020-07-28 LAB — GLUCOSE, CAPILLARY: Glucose-Capillary: 99 mg/dL (ref 70–99)

## 2020-07-28 MED ORDER — ARIPIPRAZOLE 10 MG PO TABS: 10.0000 mg | ORAL_TABLET | Freq: Every day | ORAL | 0 refills | Status: DC

## 2020-07-28 MED ORDER — OLANZAPINE 10 MG PO TABS: 10.0000 mg | ORAL_TABLET | Freq: Every day | ORAL | 0 refills | Status: DC

## 2020-07-28 MED ORDER — FAMOTIDINE 20 MG PO TABS: 20.0000 mg | ORAL_TABLET | Freq: Two times a day (BID) | ORAL | 0 refills | Status: DC

## 2020-07-28 MED ORDER — OLANZAPINE 20 MG PO TABS: 20.0000 mg | ORAL_TABLET | Freq: Every day | ORAL | 0 refills | Status: DC

## 2020-07-28 MED ORDER — BENZTROPINE MESYLATE 0.5 MG PO TABS: 0.5000 mg | ORAL_TABLET | Freq: Two times a day (BID) | ORAL | 0 refills | Status: DC | PRN

## 2020-07-28 MED ORDER — ARIPIPRAZOLE ER 400 MG IM SRER: 400.0000 mg | INTRAMUSCULAR | 0 refills | Status: DC

## 2020-07-28 MED ORDER — LITHIUM CARBONATE ER 450 MG PO TBCR: 450.0000 mg | EXTENDED_RELEASE_TABLET | Freq: Two times a day (BID) | ORAL | 0 refills | Status: DC

## 2020-07-28 MED ORDER — DOXEPIN HCL 25 MG PO CAPS: 25.0000 mg | ORAL_CAPSULE | Freq: Every evening | ORAL | 0 refills | Status: DC | PRN

## 2020-07-28 NOTE — Tx Team (Addendum)
Interdisciplinary Treatment and Diagnostic Plan Update  07/28/2020 Time of Session: 10:45am James [name] MRN: [id number]  Principal Diagnosis: Schizophrenia, unspecified (HCC)  Secondary Diagnoses: Principal Problem:   Schizophrenia, unspecified (HCC)   Current Medications:  Current Facility-Administered Medications  Medication Dose Route Frequency Provider Last Rate Last Admin   ARIPiprazole (ABILIFY) tablet 10 mg  10 mg Oral Daily Antonieta Pert, MD   10 mg at 07/28/20 0725   ARIPiprazole ER (ABILIFY MAINTENA) injection 400 mg  400 mg Intramuscular Q28 days Antonieta Pert, MD   400 mg at 07/25/20 1118   benztropine (COGENTIN) tablet 0.5 mg  0.5 mg Oral BID PRN Comer Locket, MD       doxepin (SINEQUAN) capsule 25 mg  25 mg Oral QHS PRN Antonieta Pert, MD       famotidine (PEPCID) tablet 20 mg  20 mg Oral BID Antonieta Pert, MD   20 mg at 07/28/20 0725   lithium carbonate (ESKALITH) CR tablet 450 mg  450 mg Oral BID Antonieta Pert, MD   450 mg at 07/28/20 0725   OLANZapine zydis (ZYPREXA) disintegrating tablet 10 mg  10 mg Oral Q8H PRN Antonieta Pert, MD       And   LORazepam (ATIVAN) tablet 2 mg  2 mg Oral Q6H PRN Antonieta Pert, MD       And   ziprasidone (GEODON) injection 20 mg  20 mg Intramuscular Q12H PRN Antonieta Pert, MD       metFORMIN (GLUCOPHAGE) tablet 500 mg  500 mg Oral BID WC Antonieta Pert, MD   500 mg at 07/28/20 0725   OLANZapine (ZYPREXA) tablet 10 mg  10 mg Oral Daily Antonieta Pert, MD   10 mg at 07/28/20 0725   OLANZapine (ZYPREXA) tablet 20 mg  20 mg Oral QHS Antonieta Pert, MD   20 mg at 07/27/20 2108   PTA Medications: Medications Prior to Admission  Medication Sig Dispense Refill Last Dose   buPROPion (WELLBUTRIN SR) 150 MG 12 hr tablet Take 1 tablet (150 mg total) by mouth daily. 30 tablet 0    famotidine (PEPCID) 20 MG tablet Take 1 tablet (20 mg total) by mouth daily. (Patient not taking: Reported on  07/16/2020) 30 tablet 3    famotidine (PEPCID) 20 MG tablet Take 1 tablet (20 mg total) by mouth daily. (Patient not taking: Reported on 07/16/2020) 60 tablet 3    lithium carbonate (ESKALITH) 450 MG CR tablet Take 1 tablet (450 mg total) by mouth 2 (two) times daily. 60 tablet 0    metFORMIN (GLUCOPHAGE) 500 MG tablet TAKE 1 TABLET BY MOUTH TWICE DAILY WITH MEALS (Patient taking differently: Take 500 mg by mouth 2 (two) times daily with a meal.) 180 tablet 0    OLANZapine (ZYPREXA) 10 MG tablet Take 1 tablet (10 mg total) by mouth daily. 30 tablet 0    OLANZapine (ZYPREXA) 20 MG tablet Take 1 tablet (20 mg total) by mouth at bedtime. 30 tablet 0     Patient Stressors: Marital or family conflict Medication change or noncompliance  Patient Strengths: Physical Health Other: Has benefits  Treatment Modalities: Medication Management, Group therapy, Case management,  1 to 1 session with clinician, Psychoeducation, Recreational therapy.   Physician Treatment Plan for Primary Diagnosis: Schizophrenia, unspecified (HCC) Long Term Goal(s): Improvement in symptoms so as ready for discharge   Short Term Goals: Ability to identify and develop effective coping behaviors will improve  Ability to maintain clinical measurements within normal limits will improve Compliance with prescribed medications will improve  Medication Management: Evaluate patient's response, side effects, and tolerance of medication regimen.  Therapeutic Interventions: 1 to 1 sessions, Unit Group sessions and Medication administration.  Evaluation of Outcomes: Adequate for Discharge  Physician Treatment Plan for Secondary Diagnosis: Principal Problem:   Schizophrenia, unspecified (HCC)  Long Term Goal(s): Improvement in symptoms so as ready for discharge   Short Term Goals: Ability to identify and develop effective coping behaviors will improve Ability to maintain clinical measurements within normal limits will  improve Compliance with prescribed medications will improve     Medication Management: Evaluate patient's response, side effects, and tolerance of medication regimen.  Therapeutic Interventions: 1 to 1 sessions, Unit Group sessions and Medication administration.  Evaluation of Outcomes: Adequate for Discharge   RN Treatment Plan for Primary Diagnosis: Schizophrenia, unspecified (HCC) Long Term Goal(s): Knowledge of disease and therapeutic regimen to maintain health will improve  Short Term Goals: Ability to remain free from injury will improve, Ability to demonstrate self-control, Ability to participate in decision making will improve, Ability to verbalize feelings will improve, and Compliance with prescribed medications will improve  Medication Management: RN will administer medications as ordered by provider, will assess and evaluate patient's response and provide education to patient for prescribed medication. RN will report any adverse and/or side effects to prescribing provider.  Therapeutic Interventions: 1 on 1 counseling sessions, Psychoeducation, Medication administration, Evaluate responses to treatment, Monitor vital signs and CBGs as ordered, Perform/monitor CIWA, COWS, AIMS and Fall Risk screenings as ordered, Perform wound care treatments as ordered.  Evaluation of Outcomes: Adequate for Discharge   LCSW Treatment Plan for Primary Diagnosis: Schizophrenia, unspecified (HCC) Long Term Goal(s): Safe transition to appropriate next level of care at discharge, Engage patient in therapeutic group addressing interpersonal concerns.  Short Term Goals: Engage patient in aftercare planning with referrals and resources, Increase social support, Increase ability to appropriately verbalize feelings, Facilitate acceptance of mental health diagnosis and concerns, and Increase skills for wellness and recovery  Therapeutic Interventions: Assess for all discharge needs, 1 to 1 time with Social  worker, Explore available resources and support systems, Assess for adequacy in community support network, Educate family and significant other(s) on suicide prevention, Complete Psychosocial Assessment, Interpersonal group therapy.  Evaluation of Outcomes: Adequate for Discharge   Progress in Treatment: Attending groups: No. Participating in groups: No. Taking medication as prescribed: Yes. Toleration medication: Yes. Family/Significant other contact made: Yes, individual(s) contacted:  pt's mother Patient understands diagnosis: Yes. Discussing patient identified problems/goals with staff: Yes. Medical problems stabilized or resolved: Yes. Denies suicidal/homicidal ideation: Yes. Issues/concerns per patient self-inventory: No.   New problem(s) identified: No, Describe:  none  New Short Term/Long Term Goal(s): medication stabilization, elimination of SI thoughts, development of comprehensive mental wellness plan.    Patient Goals:  Did not attend  Discharge Plan or Barriers: To return home to live with mom. Referred to ACTT   Reason for Continuation of Hospitalization: Delusions  Hallucinations Medication stabilization  Estimated Length of Stay: 3-5 days  Attendees: Patient: Did not attend 07/28/2020 11:20 AM  Physician:  07/28/2020 11:20 AM  Nursing:  07/28/2020 11:20 AM  RN Care Manager: 07/28/2020 11:20 AM  Social Worker:  07/28/2020 11:20 AM  Recreational Therapist:  07/28/2020 11:20 AM  Other:  07/28/2020 11:20 AM  Other:  07/28/2020 11:20 AM  Other: 07/28/2020 11:20 AM    Scribe for Treatment Team: Felizardo Hoffmann,  LCSWA 07/28/2020 11:20 AM

## 2020-07-28 NOTE — Progress Notes (Signed)
Recreation Therapy Notes  Date: 6.24.22 Time: 1000 Location: 500 [address] Dayroom   Group Topic: Leisure Education, Leisure Exposure    Goal Area(s) Addresses:  Patient will successfully identify skills used t complete activity. Patient will successfully identify how skills could be used with support system upon d/c.  Intervention: Recreation Participation, [place], Music   Activity: Keep It Contractor.  LRT introduced the activity and gave patients the instructions for activity.  LRT counted the number of hits patients got on the ball without letting the ball come to a complete stop.  Patients could bounce the ball or let it roll as long as it didn't stop.  If ball came to a stop, LRT would start the count over.  Education:  Teacher, English as a foreign language, Communication, Stress Management, Discharge Planning    Education Outcome: Acknowledges Education  Clinical Observations/Feedback: Pt did not participate in group.       Caroll Rancher, LRT/CTRS         Caroll Rancher A 07/28/2020 11:37 AM

## 2020-07-28 NOTE — [facility] Group Notes (Signed)
[facility] LCSW Group Therapy  07/28/2020 11:37 AM  Type of Therapy:  Group Therapy  Participation Level:  Did Not Attend   Summary of Progress/Problems: Did not attend  James [name] 07/28/2020, 11:37 AM

## 2020-07-28 NOTE — Progress Notes (Addendum)
[facility] Discharge note:  D:  Pt. verbalizes readiness for discharge and denies SI/HI. He is pleasant and cooperative and is looking forward to being discharged to his father.  He also denies any physical problems or side effects.  A: Discharge instructions reviewed with patient and family, belongings returned, prescriptions given as applicable (called in to [facility] pharmacy).    R: Pt. And family verbalize understanding of d/c instructions and state their intent to be compliant with them.  Pt discharged to caregiver without incident.  Joaquin Music, RN

## 2020-07-28 NOTE — [facility] Suicide Risk Assessment (Signed)
[facility] Discharge Suicide Risk Assessment   Principal Problem: Schizophrenia, unspecified (HCC) Discharge Diagnoses: Principal Problem:   Schizophrenia, unspecified (HCC)  Subjective: Patient seen on rounds. He denies medication side-effects and voices no physical complaints. He reports stable sleep and appetite. He denies SI, HI, AVH, paranoia, ideas of reference or first rank symptoms. He is looking forward to discharge today to stay with his parents. Per nursing he has attended groups and participated. He has not have behavioral issues or acute safety concerns noted. Time was spent reminding patient he will need ongoing metabolic labs, EKG, CBC and weight monitoring while on 2 antipsychotics and will need ongoing renal, electrolyte, thyroid and lithium level monitoring while on lithium after discharge. Time was given for questions.  Total Time Spent in Direct Patient Care:  I personally spent 30 minutes on the unit in direct patient care. The direct patient care time included face-to-face time with the patient, reviewing the patient's chart, communicating with other professionals, and coordinating care. Greater than 50% of this time was spent in counseling or coordinating care with the patient regarding goals of hospitalization, psycho-education, and discharge planning needs. He slept 9.5 hours.  Musculoskeletal: Strength & Muscle Tone: within normal limits Gait & Station: normal, steady Patient leans: N/A  General Appearance:  fair hygiene, casually dressed  Eye Contact:  Good  Speech:  Clear and Coherent and Normal Rate  Volume:  Normal  Mood:  Aloof  Affect:  Constricted but less guarded  Thought Process:  Superficially goal directed but concrete  Orientation:  oriented to self, year, month and city  Thought Content:  denies AVH, paranoia, first rank symptoms or ideas of reference - is not grossly responding to internal/external stimuli on exam  Suicidal Thoughts:   denied  Homicidal  Thoughts:   denied  Memory:  Recent;   Fair  Judgement:  Fair  Insight:  Lacking  Psychomotor Activity:  normal, no tremor noted  Concentration:  Concentration: Fair and Attention Span: Fair  Recall:  [employer] of Knowledge:  Fair  Language:  Fair  Akathisia:  Negative  Assets:  Communication Skills Desire for Improvement Resilience Social Support  ADL's:  Intact  Cognition:  WNL   Physical Exam: Physical Exam Vitals reviewed.  HENT:     Head: Normocephalic.  Pulmonary:     Effort: Pulmonary effort is normal.  Neurological:     Mental Status: He is alert.   Review of Systems  Respiratory:  Negative for shortness of breath.   Cardiovascular:  Negative for chest pain.  Gastrointestinal:  Negative for diarrhea, nausea and vomiting.  Temp 98.1, HR 93, BP 117/75   Mental Status Per Nursing Assessment::   On Admission:  AH - resolved  Demographic Factors:  Male, Low socioeconomic status, and Unemployed  Loss Factors: NA  Historical Factors: Previous psychiatric diagnoses/treatments  Risk Reduction Factors:   Living with another person, especially a relative and Positive social support; has guardian  Continued Clinical Symptoms:  Schizophrenia:   Paranoid or undifferentiated type  Cognitive Features That Contribute To Risk:  Has guardian due to incompetency   Suicide Risk:  Minimal: No identifiable suicidal ideation.    Follow-up Information     [facility] Follow up on 08/03/2020.   Why: You have a hospital follow up appointment on 08/03/20 at 10:00 am for therapy and medication management services.  This will be a Sports administrator appointment.  Please inquire at this time regarding your eligibility for ACTT team services. Contact information:  [address]  [address] [place] [address] [phone]         Saguier, [name], PA-C Follow up.   Specialties: Internal Medicine, Family Medicine Contact information: [address] [place] [address] [phone]         [facility], [facility]. Call.   Why: A referral for ACTT services has been made on your behalf. Please call to schedule and intake appointment. Contact information: [address] [place] [address] [phone]                 Plan Of Care/Follow-up recommendations:  Activity:  as tolerated Diet:  heart healthy Other:  Patient will need ongoing monitoring of his lipids, glucose, CBC, EKG and weight while on 2 antipsychotics. He will need ongoing monitoring of his thyroid panel, renal function, electrolytes and lithium level while on Lithium. He is encouraged to remain hydrated. He got his Abilify Maintena 400mg  IM injection on 07/25/20 and is due for his next 28 day injection on 08/22/20. He will need to continue oral bridging dose of Abilify 10mg  for another 10 days while the shot becomes therapeutic.He was encouraged to keep scheduled outpatient follow up appointments without fail.  08/24/20, MD, FAPA 07/28/2020, 9:23 AM

## 2020-07-28 NOTE — Plan of Care (Signed)
Patient came to one recreation therapy group session but did not participate.    Caroll Rancher, LRT/CTRS

## 2020-07-28 NOTE — Progress Notes (Signed)
Recreation Therapy Notes  INPATIENT RECREATION TR PLAN  Patient Details Name: NIKLAS CHRETIEN MRN: [id number] DOB: [birth year] Today's Date: 07/28/2020  Rec Therapy Plan Is patient appropriate for Therapeutic Recreation?: Yes Treatment times per week: about 3 days Estimated Length of Stay: 5-7 days TR Treatment/Interventions: Group participation (Comment)  Discharge Criteria Pt will be discharged from therapy if:: Discharged Treatment plan/goals/alternatives discussed and agreed upon by:: Patient/family  Discharge Summary Short term goals set: See patient care plan Short term goals met: Not met Progress toward goals comments: Groups attended Which groups?: Other (Comment) (Meditation) Reason goals not met: Pt attended one group, did not participate Therapeutic equipment acquired: N/A Reason patient discharged from therapy: Discharge from hospital Pt/family agrees with progress & goals achieved: Yes Date patient discharged from therapy: 07/28/20   Victorino Sparrow, LRT/CTRS   Ria Comment, [place] 07/28/2020, 10:59 AM

## 2020-07-28 NOTE — Progress Notes (Signed)
  [facility] Adult Case Management Discharge Plan :  Will you be returning to the same living situation after discharge:  Yes,  to  home At discharge, do you have transportation home?: Yes,  father to pick this patient up Do you have the ability to pay for your medications: Yes,  has insurance  Release of information consent forms completed and in the chart;  Patient's signature needed at discharge.  Patient to Follow up at:  Follow-up Information     [facility] Follow up on 08/03/2020.   Why: You have a hospital follow up appointment on 08/03/20 at 10:00 am for therapy and medication management services.  This will be a Sports administrator appointment.  Please inquire at this time regarding your eligibility for ACTT team services. Contact information: [address]  [address] [place] [address] [phone]         Saguier, Jeremi, PA-C Follow up.   Specialties: Internal Medicine, Family Medicine Contact information: [address] [place] [address] [phone]         [facility], [facility]. Call.   Why: A referral for ACTT services has been made on your behalf. Please call to schedule and intake appointment. Contact information: [address] [place] [address] [phone]                 Next level of care provider has access to [facility] Link:no  Safety Planning and Suicide Prevention discussed: Yes,  with mother/guardian  Have you used any form of tobacco in the last 30 days? (Cigarettes, Smokeless Tobacco, Cigars, and/or Pipes): Yes  Has patient been referred to the Quitline?: Patient refused referral  Patient has been referred for addiction treatment: Pt. refused referral  Otelia Santee, LCSW 07/28/2020, 9:44 AM
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
## 2020-07-28 NOTE — B[facility] Group Notes (Signed)
  Patient did not attend group.  SPIRITUALITY GROUP NOTE   Spirituality group facilitated by Chaplain Katy Zenith Kercheval, BCC.   Group Description: Group focused on topic of hope. Patients participated in facilitated discussion around topic, connecting with one another around experiences and definitions for hope. Group members engaged with visual explorer photos, reflecting on what hope looks like for them today. Group engaged in discussion around how their definitions of hope are present today in hospital.   Modalities: Psycho-social ed, Adlerian, Narrative, MI    

## 2020-08-03 DIAGNOSIS — F209 Schizophrenia, unspecified: Secondary | ICD-10-CM | POA: Diagnosis not present

## 2020-08-03 DIAGNOSIS — F1211 Cannabis abuse, in remission: Secondary | ICD-10-CM | POA: Diagnosis not present

## 2020-08-03 DIAGNOSIS — F17210 Nicotine dependence, cigarettes, uncomplicated: Secondary | ICD-10-CM | POA: Diagnosis not present

## 2020-08-28 ENCOUNTER — Other Ambulatory Visit: Payer: Self-pay | Admitting: Medical

## 2020-09-06 ENCOUNTER — Ambulatory Visit (INDEPENDENT_AMBULATORY_CARE_PROVIDER_SITE_OTHER): Payer: Medicare Other | Admitting: Podiatry

## 2020-09-06 ENCOUNTER — Other Ambulatory Visit: Payer: Self-pay

## 2020-09-06 ENCOUNTER — Encounter: Payer: Self-pay | Admitting: Podiatry

## 2020-09-06 DIAGNOSIS — M79675 Pain in left toe(s): Secondary | ICD-10-CM

## 2020-09-06 DIAGNOSIS — B351 Tinea unguium: Secondary | ICD-10-CM

## 2020-09-06 DIAGNOSIS — Q828 Other specified congenital malformations of skin: Secondary | ICD-10-CM | POA: Diagnosis not present

## 2020-09-06 DIAGNOSIS — M79674 Pain in right toe(s): Secondary | ICD-10-CM

## 2020-09-06 NOTE — Progress Notes (Signed)
This patient returns to the office for evaluation and treatment of long thick painful nails .  This patient is unable to trim his own nails since the patient cannot reach his feet.  Patient says the nails are painful walking and wearing his shoes. He also has painful callus under both big toes.   He presents to the office with his mother.He returns for preventive foot care services.  General Appearance  Alert, conversant and in no acute stress.  Vascular  Dorsalis pedis and posterior tibial  pulses are palpable  bilaterally.  Capillary return is within normal limits  bilaterally. Temperature is within normal limits  bilaterally.  Neurologic  Senn-Weinstein monofilament wire test within normal limits  bilaterally. Muscle power within normal limits bilaterally.  Nails Thick disfigured discolored nails with subungual debris  from hallux to fifth toes bilaterally. No evidence of bacterial infection or drainage bilaterally.  Orthopedic  No limitations of motion  feet .  No crepitus or effusions noted.  No bony pathology or digital deformities noted.  Skin  normotropic skin with no porokeratosis noted bilaterally.  No signs of infections or ulcers noted.  Callus/porokeratosis hallux  B/L   Onychomycosis  Pain in toes right foot  Pain in toes left foot  Callus hallux  B/L  Debridement  of nails  1-5  B/L with a nail nipper.  Nails were then filed using a dremel tool with no incidents.  Debride callus hallux  B/L. with # 15 blade.    RTC 3 months   Helane Gunther DPM

## 2020-09-15 ENCOUNTER — Ambulatory Visit (INDEPENDENT_AMBULATORY_CARE_PROVIDER_SITE_OTHER): Payer: Medicare Other | Admitting: Podiatry

## 2020-09-15 ENCOUNTER — Other Ambulatory Visit: Payer: Self-pay

## 2020-09-15 DIAGNOSIS — Q828 Other specified congenital malformations of skin: Secondary | ICD-10-CM

## 2020-09-20 NOTE — Progress Notes (Signed)
Subjective:  Patient ID: James [name], male    DOB: 07/10/1980,  MRN: [id number]  Chief Complaint  Patient presents with   Callouses    Following up for bilateral callustrim.     40 y.o. male presents with the above complaint.  Patient presents with complaint of bilateral hallux plantar IPJ callus formation.  Patient states is very painful to touch has progressed to gotten worse.  He normally gets it debrided down which does give him some relief.  He has not seen anyoneat our practice.  He would like to continue other options if needed.  For now he would like to have them debrided down.  He denies any other acute complaints.   Review of Systems: Negative except as noted in the HPI. Denies N/V/F/Ch.  Past Medical History:  Diagnosis Date   Gastroesophageal reflux disease 11/25/2017    Current Outpatient Medications:    ARIPiprazole (ABILIFY) 10 MG tablet, Take 1 tablet (10 mg total) by mouth daily. (Take for 10 more days, then stop): For mood control, Disp: 10 tablet, Rfl: 0   ARIPiprazole ER (ABILIFY MAINTENA) 400 MG SRER injection, Inject 2 mLs (400 mg total) into the muscle every 28 (twenty-eight) days. (Due on 08-24-20): For mood control, Disp: 1 each, Rfl: 0   benztropine (COGENTIN) 0.5 MG tablet, Take 1 tablet (0.5 mg total) by mouth 2 (two) times daily as needed for tremors (EPS)., Disp: 60 tablet, Rfl: 0   doxepin (SINEQUAN) 25 MG capsule, Take 1 capsule (25 mg total) by mouth at bedtime as needed (insomnia)., Disp: 30 capsule, Rfl: 0   famotidine (PEPCID) 20 MG tablet, Take 1 tablet (20 mg total) by mouth 2 (two) times daily. For acid reflux, Disp: 15 tablet, Rfl: 0   lithium carbonate (ESKALITH) 450 MG CR tablet, Take 1 tablet (450 mg total) by mouth 2 (two) times daily. For mood stabilization, Disp: 60 tablet, Rfl: 0   metFORMIN (GLUCOPHAGE) 500 MG tablet, TAKE 1 TABLET BY MOUTH TWICE DAILY WITH MEALS, Disp: 180 tablet, Rfl: 0   OLANZapine (ZYPREXA) 10 MG tablet, Take 1  tablet (10 mg total) by mouth daily. For mood control, Disp: 30 tablet, Rfl: 0   OLANZapine (ZYPREXA) 20 MG tablet, Take 1 tablet (20 mg total) by mouth at bedtime. For mood control, Disp: 30 tablet, Rfl: 0  Social History   Tobacco Use  Smoking Status Every Day   Packs/day: 0.50   Types: Cigarettes  Smokeless Tobacco Never    Allergies  Allergen Reactions   Clozapine Other (See Comments)    Reaction unknown   Latuda [Lurasidone] Other (See Comments)    Over time developed a termor in his hands.   Mango Flavor     Mango; [place]: Patient has no known reaction to report. Mom states he just don't like.   Objective:  There were no vitals filed for this visit. There is no height or weight on file to calculate BMI. Constitutional Well developed. Well nourished.  Vascular Dorsalis pedis pulses palpable bilaterally. Posterior tibial pulses palpable bilaterally. Capillary refill normal to all digits.  No cyanosis or clubbing noted. Pedal hair growth normal.  Neurologic Normal speech. Oriented to person, place, and time. Epicritic sensation to light touch grossly present bilaterally.  Dermatologic Hyperkeratotic lesion with central nucleated core noted.  Pain on palpation to the lesion.  No pinpoint bleeding noted upon debridement.  Orthopedic: Normal joint ROM without pain or crepitus bilaterally. No visible deformities. No bony tenderness.   Radiographs: None  Assessment:  No diagnosis found. Plan:  Patient was evaluated and treated and all questions answered.  Bilateral hallux IPJ porokeratosis x2 -I explained the patient the etiology of porokeratosis and worse treatment options were discussed.  Given the amount of pain that is having I believe would benefit from debridement of the lesion followed by excision of central nucleated core.  Patient agrees with the like to proceed with a debridement -Using chisel blade to handle the lesion was debris down to healthy striated tissue  followed by excision of central nucleated core noted.  No pinpoint bleeding noted no complication noted. -I will discuss surgical options of next clinical visit if there is no improvement.  No follow-ups on file.

## 2020-10-03 ENCOUNTER — Other Ambulatory Visit: Payer: Self-pay | Admitting: Medical

## 2020-10-30 DIAGNOSIS — F209 Schizophrenia, unspecified: Secondary | ICD-10-CM | POA: Diagnosis not present

## 2020-10-30 DIAGNOSIS — F1211 Cannabis abuse, in remission: Secondary | ICD-10-CM | POA: Diagnosis not present

## 2020-10-30 DIAGNOSIS — F17210 Nicotine dependence, cigarettes, uncomplicated: Secondary | ICD-10-CM | POA: Diagnosis not present

## 2020-12-13 ENCOUNTER — Ambulatory Visit (INDEPENDENT_AMBULATORY_CARE_PROVIDER_SITE_OTHER): Payer: Medicare Other | Admitting: Podiatry

## 2020-12-13 ENCOUNTER — Other Ambulatory Visit: Payer: Self-pay

## 2020-12-13 ENCOUNTER — Encounter: Payer: Self-pay | Admitting: Podiatry

## 2020-12-13 DIAGNOSIS — B351 Tinea unguium: Secondary | ICD-10-CM | POA: Diagnosis not present

## 2020-12-13 DIAGNOSIS — Q828 Other specified congenital malformations of skin: Secondary | ICD-10-CM

## 2020-12-13 DIAGNOSIS — M79676 Pain in unspecified toe(s): Secondary | ICD-10-CM

## 2020-12-13 NOTE — Progress Notes (Signed)
This patient returns to the office for evaluation and treatment of long thick painful nails .  This patient is unable to trim his own nails since the patient cannot reach his feet.  Patient says the nails are painful walking and wearing his shoes. He also has painful callus under both big toes.   He presents to the office with his mother.He returns for preventive foot care services.  General Appearance  Alert, conversant and in no acute stress.  Vascular  Dorsalis pedis and posterior tibial  pulses are palpable  bilaterally.  Capillary return is within normal limits  bilaterally. Temperature is within normal limits  bilaterally.  Neurologic  Senn-Weinstein monofilament wire test within normal limits  bilaterally. Muscle power within normal limits bilaterally.  Nails Thick disfigured discolored nails with subungual debris  from hallux to fifth toes bilaterally. No evidence of bacterial infection or drainage bilaterally.  Orthopedic  No limitations of motion  feet .  No crepitus or effusions noted.  No bony pathology or digital deformities noted.  Skin  normotropic skin with no porokeratosis noted bilaterally.  No signs of infections or ulcers noted.  Callus/porokeratosis hallux  B/L   Onychomycosis  Pain in toes right foot  Pain in toes left foot  Callus hallux  B/L  Debridement  of nails  1-5  B/L with a nail nipper.  Nails were then filed using a dremel tool with no incidents.  Debride callus hallux  B/L. with # 15 blade.    RTC 3 months   Helane Gunther DPM

## 2021-01-10 DIAGNOSIS — F209 Schizophrenia, unspecified: Secondary | ICD-10-CM | POA: Diagnosis not present

## 2021-01-10 DIAGNOSIS — F17210 Nicotine dependence, cigarettes, uncomplicated: Secondary | ICD-10-CM | POA: Diagnosis not present

## 2021-03-15 ENCOUNTER — Other Ambulatory Visit: Payer: Self-pay | Admitting: Medical

## 2021-03-26 ENCOUNTER — Ambulatory Visit: Payer: Medicare Other | Admitting: Podiatry

## 2021-03-27 ENCOUNTER — Ambulatory Visit (INDEPENDENT_AMBULATORY_CARE_PROVIDER_SITE_OTHER): Payer: Medicare Other | Admitting: Podiatry

## 2021-03-27 ENCOUNTER — Encounter: Payer: Self-pay | Admitting: Podiatry

## 2021-03-27 ENCOUNTER — Other Ambulatory Visit: Payer: Self-pay

## 2021-03-27 DIAGNOSIS — B351 Tinea unguium: Secondary | ICD-10-CM | POA: Diagnosis not present

## 2021-03-27 DIAGNOSIS — M79675 Pain in left toe(s): Secondary | ICD-10-CM | POA: Diagnosis not present

## 2021-03-27 DIAGNOSIS — M79674 Pain in right toe(s): Secondary | ICD-10-CM | POA: Diagnosis not present

## 2021-03-27 NOTE — Progress Notes (Signed)
This patient returns to the office for evaluation and treatment of long thick painful nails .  This patient is unable to trim his own nails since the patient cannot reach his feet.  Patient says the nails are painful walking and wearing his shoes. He also has callus under both big toes.   He presents to the office with his mother.He returns for preventive foot care services.  General Appearance  Alert, conversant and in no acute stress.  Vascular  Dorsalis pedis and posterior tibial  pulses are palpable  bilaterally.  Capillary return is within normal limits  bilaterally. Temperature is within normal limits  bilaterally.  Neurologic  Senn-Weinstein monofilament wire test within normal limits  bilaterally. Muscle power within normal limits bilaterally.  Nails Thick disfigured discolored nails with subungual debris  hallux nails bilaterally. No evidence of bacterial infection or drainage bilaterally.  Orthopedic  No limitations of motion  feet .  No crepitus or effusions noted.  No bony pathology or digital deformities noted.  Skin  normotropic skin with no porokeratosis noted bilaterally.  No signs of infections or ulcers noted.    Onychomycosis  Pain in toes right foot  Pain in toes left foot    Debridement  of nails  1-5  B/L with a nail nipper.  Nails were then filed using a dremel tool with no incidents.  RTC 4 months   Gardiner Barefoot DPM

## 2021-03-30 ENCOUNTER — Ambulatory Visit (INDEPENDENT_AMBULATORY_CARE_PROVIDER_SITE_OTHER): Payer: Medicare Other | Admitting: Medical

## 2021-03-30 VITALS — BP 116/72 | HR 85 | Temp 98.0°F | Resp 18 | Ht 69.0 in | Wt 176.2 lb

## 2021-03-30 DIAGNOSIS — R739 Hyperglycemia, unspecified: Secondary | ICD-10-CM

## 2021-03-30 DIAGNOSIS — F200 Paranoid schizophrenia: Secondary | ICD-10-CM

## 2021-03-30 DIAGNOSIS — F17200 Nicotine dependence, unspecified, uncomplicated: Secondary | ICD-10-CM

## 2021-03-30 DIAGNOSIS — K219 Gastro-esophageal reflux disease without esophagitis: Secondary | ICD-10-CM | POA: Diagnosis not present

## 2021-03-30 LAB — COMPREHENSIVE METABOLIC PANEL
ALT: 20 U/L (ref 0–53)
AST: 15 U/L (ref 0–37)
Albumin: 4.3 g/dL (ref 3.5–5.2)
Alkaline Phosphatase: 74 U/L (ref 39–117)
BUN: 9 mg/dL (ref 6–23)
CO2: 30 mEq/L (ref 19–32)
Calcium: 9.4 mg/dL (ref 8.4–10.5)
Chloride: 108 mEq/L (ref 96–112)
Creatinine, Ser: 0.94 mg/dL (ref 0.40–1.50)
GFR: 100.33 mL/min (ref >60.00)
Glucose, Bld: 92 mg/dL (ref 70–99)
Potassium: 4.2 mEq/L (ref 3.5–5.1)
Sodium: 140 mEq/L (ref 135–145)
Total Bilirubin: 0.4 mg/dL (ref 0.2–1.2)
Total Protein: 6.6 g/dL (ref 6.0–8.3)

## 2021-03-30 LAB — HEMOGLOBIN A1C: Hgb A1c MFr Bld: 5.9 % (ref 4.6–6.5)

## 2021-03-30 NOTE — Progress Notes (Signed)
Subjective:    Patient ID: James [name], male    DOB: 1980/05/28, 41 y.o.   MRN: [id number]  HPI  Hx of elevated sugar. A1c last year good but in past mild borderline. Pt is on metformin. Mom had question on metformin side effect on kidney function. Pt has not concerning signs/symptoms.   Pt has schizophrennia. Mood is stable. Pt is on lithium and zyprexa. Sees psychiatrist and therapist. Saw psychiatrist yesterday. Mom states son stable and psychiatrist was pleased.   Pt is a smoker. He smokes about 10 cigarettes a day.Psychiatrist may rx med to help quit smoking.    Review of Systems  Constitutional:  Negative for chills, fatigue and unexpected weight change.  Respiratory:  Negative for choking, shortness of breath and wheezing.   Cardiovascular:  Negative for chest pain and palpitations.  Gastrointestinal:  Negative for abdominal pain.  Genitourinary:  Negative for dysuria and flank pain.  Musculoskeletal:  Negative for back pain.  Neurological:  Negative for dizziness and headaches.  Hematological:  Negative for adenopathy. Does not bruise/bleed easily.  Psychiatric/Behavioral:  Negative for behavioral problems.      Past Medical History:  Diagnosis Date   Gastroesophageal reflux disease 11/25/2017     Social History   Socioeconomic History   Marital status: Single    Spouse name: Not on file   Number of children: Not on file   Years of education: Not on file   Highest education level: Not on file  Occupational History   Not on file  Tobacco Use   Smoking status: Every Day    Packs/day: 0.50    Types: Cigarettes   Smokeless tobacco: Never  Vaping Use   Vaping Use: Never used  Substance and Sexual Activity   Alcohol use: No    Alcohol/week: 0.0 standard drinks    Comment: rare alcohol   Drug use: No   Sexual activity: Yes    Comment: male  Other Topics Concern   Not on file  Social History Narrative   Not on file   Social Determinants of Health    Financial Resource Strain: Not on file  Food Insecurity: Not on file  Transportation Needs: Not on file  Physical Activity: Not on file  Stress: Not on file  Social Connections: Not on file  Intimate Partner Violence: Not on file    No past surgical history on file.  Family History  Problem Relation Age of Onset   Diabetes Mother    Diabetes Father    Prostate cancer Father     Allergies  Allergen Reactions   Clozapine Other (See Comments)    Reaction unknown   Latuda [Lurasidone] Other (See Comments)    Over time developed a termor in his hands.   Mango Flavor     Mango; [place]: Patient has no known reaction to report. Mom states he just don't like.    Current Outpatient Medications on File Prior to Visit  Medication Sig Dispense Refill   ARIPiprazole (ABILIFY) 10 MG tablet Take 1 tablet (10 mg total) by mouth daily. (Take for 10 more days, then stop): For mood control 10 tablet 0   ARIPiprazole ER (ABILIFY MAINTENA) 400 MG SRER injection Inject 2 mLs (400 mg total) into the muscle every 28 (twenty-eight) days. (Due on 08-24-20): For mood control 1 each 0   benztropine (COGENTIN) 0.5 MG tablet Take 1 tablet (0.5 mg total) by mouth 2 (two) times daily as needed for tremors (EPS). 60 tablet  0   doxepin (SINEQUAN) 25 MG capsule Take 1 capsule (25 mg total) by mouth at bedtime as needed (insomnia). 30 capsule 0   famotidine (PEPCID) 20 MG tablet Take 1 tablet (20 mg total) by mouth 2 (two) times daily. For acid reflux 15 tablet 0   lithium carbonate (ESKALITH) 450 MG CR tablet Take 1 tablet (450 mg total) by mouth 2 (two) times daily. For mood stabilization 60 tablet 0   metFORMIN (GLUCOPHAGE) 500 MG tablet TAKE 1 TABLET BY MOUTH TWICE DAILY WITH MEALS 180 tablet 0   OLANZapine (ZYPREXA) 10 MG tablet Take 1 tablet (10 mg total) by mouth daily. For mood control 30 tablet 0   OLANZapine (ZYPREXA) 20 MG tablet Take 1 tablet (20 mg total) by mouth at bedtime. For mood control 30  tablet 0   No current facility-administered medications on file prior to visit.    BP 116/72    Pulse 85    Temp 98 F (36.7 C)    Resp 18    Ht 5\' 9"  (1.753 m)    Wt 176 lb 3.2 oz (79.9 kg)    SpO2 100%    BMI 26.02 kg/m       Objective:   Physical Exam  General- No acute distress. Pleasant patient. Neck- Full range of motion, no jvd Lungs- Clear, even and unlabored. Heart- regular rate and rhythm. Neurologic- CNII- XII grossly intact.       Assessment & Plan:   Patient Instructions  Elevated blood sugar in the past.  At 1 point A1c was 5.8 in the past.  Will get A1c with metabolic panel today.  If A1c tightly controlled we could stop metformin for approximate 3 months and then repeat A1c to evaluate differential and sugar average.  If A1c tightly controlled I think this is reasonable based on your concerns for med side effects.  Prior GERD well controlled.  Recommend healthy diet as discussed.  If any recurrence of reflux type symptoms let me know.  Do recommend smoking cessation.  Would defer add on medication treatment to psychiatrist.  Schizophrenia well controlled and just saw psychiatrist yesterday.  Follow-up in 3 months or sooner if needed.    , PA-C

## 2021-03-30 NOTE — Patient Instructions (Addendum)
Elevated blood sugar in the past.  At 1 point A1c was 5.8 in the past.  Will get A1c with metabolic panel today.  If A1c tightly controlled we could stop metformin for approximate 3 months and then repeat A1c to evaluate differential and sugar average.  If A1c tightly controlled I think this is reasonable based on your concerns for med side effects.  Prior GERD well controlled.  Recommend healthy diet as discussed.  If any recurrence of reflux type symptoms let me know.  Do recommend smoking cessation.  Would defer add on medication treatment to psychiatrist.  Schizophrenia well controlled and just saw psychiatrist yesterday.  Follow-up in 3 months or sooner if needed.

## 2021-04-05 ENCOUNTER — Telehealth: Payer: Self-pay | Admitting: Medical

## 2021-04-05 NOTE — Telephone Encounter (Signed)
Pt mother dropped off form to be filled out ?Would like to be called when its ready to be picked up [phone] ? ?Placed in [place] bin up front ?

## 2021-04-06 DIAGNOSIS — Z0279 Encounter for issue of other medical certificate: Secondary | ICD-10-CM

## 2021-04-06 NOTE — Telephone Encounter (Signed)
Form filled out , patient's mother called to let her know it was ready for pick up ?

## 2021-04-13 ENCOUNTER — Ambulatory Visit (HOSPITAL_COMMUNITY)
Admission: EM | Admit: 2021-04-13 | Discharge: 2021-04-14 | Disposition: A | Discharge status: Other Acute Inpatient Hospital | Payer: Medicare Other | Attending: Family | Admitting: Family

## 2021-04-13 DIAGNOSIS — F259 Schizoaffective disorder, unspecified: Secondary | ICD-10-CM | POA: Insufficient documentation

## 2021-04-13 DIAGNOSIS — Z7984 Long term (current) use of oral hypoglycemic drugs: Secondary | ICD-10-CM | POA: Diagnosis not present

## 2021-04-13 DIAGNOSIS — Z79899 Other long term (current) drug therapy: Secondary | ICD-10-CM | POA: Insufficient documentation

## 2021-04-13 DIAGNOSIS — F209 Schizophrenia, unspecified: Secondary | ICD-10-CM | POA: Diagnosis present

## 2021-04-13 DIAGNOSIS — Z20822 Contact with and (suspected) exposure to covid-19: Secondary | ICD-10-CM | POA: Diagnosis not present

## 2021-04-13 LAB — RESP PANEL BY RT-PCR (FLU A&B, COVID) ARPGX2
Influenza A by PCR: NEGATIVE
Influenza B by PCR: NEGATIVE
SARS Coronavirus 2 by RT PCR: NEGATIVE

## 2021-04-13 LAB — LIPID PANEL
Cholesterol: 174 mg/dL (ref 0–200)
HDL: 47 mg/dL (ref >40)
LDL Cholesterol: 114 mg/dL — ABNORMAL HIGH (ref 0–99)
Total CHOL/HDL Ratio: 3.7 RATIO
Triglycerides: 67 mg/dL (ref <150)
VLDL: 13 mg/dL (ref 0–40)

## 2021-04-13 LAB — POCT URINE DRUG SCREEN - MANUAL ENTRY (I-SCREEN)
POC Amphetamine UR: NOT DETECTED
POC Buprenorphine (BUP): NOT DETECTED
POC Cocaine UR: NOT DETECTED
POC Marijuana UR: NOT DETECTED
POC Methadone UR: NOT DETECTED
POC Methamphetamine UR: NOT DETECTED
POC Morphine: NOT DETECTED
POC Oxazepam (BZO): NOT DETECTED
POC Oxycodone UR: NOT DETECTED
POC Secobarbital (BAR): NOT DETECTED

## 2021-04-13 LAB — CBC WITH DIFFERENTIAL/PLATELET
Abs Immature Granulocytes: 0.01 10*3/uL (ref 0.00–0.07)
Basophils Absolute: 0 10*3/uL (ref 0.0–0.1)
Basophils Relative: 1 %
Eosinophils Absolute: 0.1 10*3/uL (ref 0.0–0.5)
Eosinophils Relative: 2 %
HCT: 44.6 % (ref 39.0–52.0)
Hemoglobin: 14.7 g/dL (ref 13.0–17.0)
Immature Granulocytes: 0 %
Lymphocytes Relative: 35 %
Lymphs Abs: 2.1 10*3/uL (ref 0.7–4.0)
MCH: 29 pg (ref 26.0–34.0)
MCHC: 33 g/dL (ref 30.0–36.0)
MCV: 88 fL (ref 80.0–100.0)
Monocytes Absolute: 0.5 10*3/uL (ref 0.1–1.0)
Monocytes Relative: 8 %
Neutro Abs: 3.3 10*3/uL (ref 1.7–7.7)
Neutrophils Relative %: 54 %
Platelets: 242 10*3/uL (ref 150–400)
RBC: 5.07 MIL/uL (ref 4.22–5.81)
RDW: 13.9 % (ref 11.5–15.5)
WBC: 6.1 10*3/uL (ref 4.0–10.5)
nRBC: 0 % (ref 0.0–0.2)

## 2021-04-13 LAB — COMPREHENSIVE METABOLIC PANEL
ALT: 22 U/L (ref 0–44)
AST: 19 U/L (ref 15–41)
Albumin: 4 g/dL (ref 3.5–5.0)
Alkaline Phosphatase: 74 U/L (ref 38–126)
Anion gap: 12 (ref 5–15)
BUN: 10 mg/dL (ref 6–20)
CO2: 22 mmol/L (ref 22–32)
Calcium: 9.6 mg/dL (ref 8.9–10.3)
Chloride: 109 mmol/L (ref 98–111)
Creatinine, Ser: 0.97 mg/dL (ref 0.61–1.24)
GFR, Estimated: >60 mL/min (ref >60)
Glucose, Bld: 93 mg/dL (ref 70–99)
Potassium: 3.8 mmol/L (ref 3.5–5.1)
Sodium: 143 mmol/L (ref 135–145)
Total Bilirubin: 0.4 mg/dL (ref 0.3–1.2)
Total Protein: 7.1 g/dL (ref 6.5–8.1)

## 2021-04-13 LAB — TSH: TSH: 1.079 u[IU]/mL (ref 0.350–4.500)

## 2021-04-13 LAB — MAGNESIUM: Magnesium: 1.8 mg/dL (ref 1.7–2.4)

## 2021-04-13 LAB — POC SARS CORONAVIRUS 2 AG -  ED: SARS Coronavirus 2 Ag: NEGATIVE

## 2021-04-13 LAB — LITHIUM LEVEL: Lithium Lvl: 0.1 mmol/L — ABNORMAL LOW (ref 0.60–1.20)

## 2021-04-13 LAB — ETHANOL: Alcohol, Ethyl (B): <10 mg/dL (ref <10)

## 2021-04-13 MED ORDER — OLANZAPINE 10 MG PO TBDP
10.0000 mg | ORAL_TABLET | Freq: Three times a day (TID) | ORAL | Status: DC | PRN
Start: 2021-04-13 — End: 2021-04-14

## 2021-04-13 MED ORDER — BENZTROPINE MESYLATE 0.5 MG PO TABS
0.5000 mg | ORAL_TABLET | Freq: Two times a day (BID) | ORAL | Status: DC | PRN
  Administered 2021-04-14: 0.5 mg via ORAL
  Filled 2021-04-13: qty 1

## 2021-04-13 MED ORDER — OLANZAPINE 10 MG PO TABS
10.0000 mg | ORAL_TABLET | Freq: Every day | ORAL | Status: DC
  Administered 2021-04-14: 10 mg via ORAL
  Filled 2021-04-13 (×2): qty 1

## 2021-04-13 MED ORDER — MAGNESIUM HYDROXIDE 400 MG/5ML PO SUSP: 30.0000 mL | Freq: Every day | ORAL | Status: DC | PRN

## 2021-04-13 MED ORDER — OLANZAPINE 10 MG PO TABS
20.0000 mg | ORAL_TABLET | Freq: Every day | ORAL | Status: DC
  Administered 2021-04-13: 20 mg via ORAL
  Filled 2021-04-13: qty 2

## 2021-04-13 MED ORDER — DOXEPIN HCL 25 MG PO CAPS: 25.0000 mg | ORAL_CAPSULE | Freq: Every evening | ORAL | Status: DC | PRN

## 2021-04-13 MED ORDER — LORAZEPAM 1 MG PO TABS: 1.0000 mg | ORAL_TABLET | ORAL | Status: DC | PRN

## 2021-04-13 MED ORDER — ACETAMINOPHEN 325 MG PO TABS: 650.0000 mg | ORAL_TABLET | Freq: Four times a day (QID) | ORAL | Status: DC | PRN

## 2021-04-13 MED ORDER — METFORMIN HCL 500 MG PO TABS
500.0000 mg | ORAL_TABLET | Freq: Two times a day (BID) | ORAL | Status: DC
  Administered 2021-04-14: 500 mg via ORAL
  Filled 2021-04-13: qty 1

## 2021-04-13 MED ORDER — OLANZAPINE 5 MG PO TBDP
5.0000 mg | ORAL_TABLET | Freq: Two times a day (BID) | ORAL | Status: DC
  Administered 2021-04-13: 5 mg via ORAL
  Filled 2021-04-13: qty 1

## 2021-04-13 MED ORDER — ALUM & MAG HYDROXIDE-SIMETH 200-200-20 MG/5ML PO SUSP: 30.0000 mL | ORAL | Status: DC | PRN

## 2021-04-13 MED ORDER — ZIPRASIDONE MESYLATE 20 MG IM SOLR: 20.0000 mg | INTRAMUSCULAR | Status: DC | PRN

## 2021-04-13 MED ORDER — TRAZODONE HCL 50 MG PO TABS
50.0000 mg | ORAL_TABLET | Freq: Every evening | ORAL | Status: DC | PRN
Start: 2021-04-13 — End: 2021-04-14

## 2021-04-13 MED ORDER — HYDROXYZINE HCL 25 MG PO TABS
25.0000 mg | ORAL_TABLET | Freq: Three times a day (TID) | ORAL | Status: DC | PRN
  Administered 2021-04-14: 25 mg via ORAL
  Filled 2021-04-13: qty 1

## 2021-04-13 MED ORDER — OLANZAPINE 10 MG IM SOLR: 5.0000 mg | Freq: Two times a day (BID) | INTRAMUSCULAR | Status: DC
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
## 2021-04-13 NOTE — ED Notes (Signed)
Pt asleep in bed. Respirations even and unlabored. Will continue to monitor for safety. ?
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
## 2021-04-13 NOTE — ED Notes (Signed)
Pt alert to person . He was brought in by [facility] under IVC.  He was searched and belongings locked in locker 13.  Pt was oriented to milieu and given meal.   No further distress noted.  ?

## 2021-04-14 ENCOUNTER — Encounter (HOSPITAL_COMMUNITY): Payer: Self-pay | Admitting: Family

## 2021-04-14 ENCOUNTER — Other Ambulatory Visit: Payer: Self-pay

## 2021-04-14 ENCOUNTER — Inpatient Hospital Stay (HOSPITAL_COMMUNITY)
Admission: AD | Admit: 2021-04-14 | Discharge: 2021-04-25 | DRG: 885 | Disposition: A | Discharge status: Home / Self Care | Payer: Medicare Other | Source: Intra-hospital | Attending: Psychiatry | Admitting: Psychiatry

## 2021-04-14 DIAGNOSIS — R7309 Other abnormal glucose: Secondary | ICD-10-CM | POA: Diagnosis present

## 2021-04-14 DIAGNOSIS — Z9114 Patient's other noncompliance with medication regimen: Secondary | ICD-10-CM

## 2021-04-14 DIAGNOSIS — Z79899 Other long term (current) drug therapy: Secondary | ICD-10-CM

## 2021-04-14 DIAGNOSIS — F209 Schizophrenia, unspecified: Principal | ICD-10-CM | POA: Diagnosis present

## 2021-04-14 DIAGNOSIS — K219 Gastro-esophageal reflux disease without esophagitis: Secondary | ICD-10-CM | POA: Diagnosis present

## 2021-04-14 DIAGNOSIS — Z20822 Contact with and (suspected) exposure to covid-19: Secondary | ICD-10-CM | POA: Diagnosis present

## 2021-04-14 DIAGNOSIS — Z91018 Allergy to other foods: Secondary | ICD-10-CM

## 2021-04-14 DIAGNOSIS — Z888 Allergy status to other drugs, medicaments and biological substances status: Secondary | ICD-10-CM

## 2021-04-14 DIAGNOSIS — Z818 Family history of other mental and behavioral disorders: Secondary | ICD-10-CM

## 2021-04-14 DIAGNOSIS — F32A Depression, unspecified: Secondary | ICD-10-CM | POA: Diagnosis present

## 2021-04-14 DIAGNOSIS — Z7984 Long term (current) use of oral hypoglycemic drugs: Secondary | ICD-10-CM | POA: Diagnosis not present

## 2021-04-14 DIAGNOSIS — F259 Schizoaffective disorder, unspecified: Principal | ICD-10-CM | POA: Diagnosis present

## 2021-04-14 DIAGNOSIS — F17210 Nicotine dependence, cigarettes, uncomplicated: Secondary | ICD-10-CM | POA: Diagnosis present

## 2021-04-14 DIAGNOSIS — F200 Paranoid schizophrenia: Secondary | ICD-10-CM | POA: Diagnosis not present

## 2021-04-14 DIAGNOSIS — I4519 Other right bundle-branch block: Secondary | ICD-10-CM | POA: Diagnosis present

## 2021-04-14 DIAGNOSIS — Z833 Family history of diabetes mellitus: Secondary | ICD-10-CM

## 2021-04-14 LAB — URINALYSIS, ROUTINE W REFLEX MICROSCOPIC
Bacteria, UA: NONE SEEN
Bilirubin Urine: NEGATIVE
Glucose, UA: NEGATIVE mg/dL
Ketones, ur: NEGATIVE mg/dL
Leukocytes,Ua: NEGATIVE
Nitrite: NEGATIVE
Protein, ur: NEGATIVE mg/dL
Specific Gravity, Urine: 1.026 (ref 1.005–1.030)
pH: 5 (ref 5.0–8.0)

## 2021-04-14 LAB — PROLACTIN: Prolactin: 6.9 ng/mL (ref 4.0–15.2)

## 2021-04-14 MED ORDER — ACETAMINOPHEN 325 MG PO TABS: 650.0000 mg | ORAL_TABLET | Freq: Four times a day (QID) | ORAL | Status: DC | PRN

## 2021-04-14 MED ORDER — BENZTROPINE MESYLATE 0.5 MG PO TABS: 0.5000 mg | ORAL_TABLET | Freq: Two times a day (BID) | ORAL | Status: DC | PRN

## 2021-04-14 MED ORDER — METFORMIN HCL 500 MG PO TABS
500.0000 mg | ORAL_TABLET | Freq: Two times a day (BID) | ORAL | Status: DC
  Administered 2021-04-14 – 2021-04-19 (×11): 500 mg via ORAL
  Filled 2021-04-14 (×20): qty 1

## 2021-04-14 MED ORDER — ZIPRASIDONE MESYLATE 20 MG IM SOLR: 20.0000 mg | INTRAMUSCULAR | Status: DC | PRN

## 2021-04-14 MED ORDER — TRAZODONE HCL 50 MG PO TABS
50.0000 mg | ORAL_TABLET | Freq: Every evening | ORAL | Status: DC | PRN
Start: 2021-04-14 — End: 2021-04-15

## 2021-04-14 MED ORDER — OLANZAPINE 10 MG PO TABS
10.0000 mg | ORAL_TABLET | Freq: Every day | ORAL | Status: DC
  Administered 2021-04-14 – 2021-04-15 (×2): 10 mg via ORAL
  Filled 2021-04-14 (×5): qty 1

## 2021-04-14 MED ORDER — LORAZEPAM 1 MG PO TABS: 1.0000 mg | ORAL_TABLET | ORAL | Status: DC | PRN

## 2021-04-14 MED ORDER — OLANZAPINE 5 MG PO TBDP: 5.0000 mg | ORAL_TABLET | Freq: Three times a day (TID) | ORAL | Status: DC | PRN

## 2021-04-14 MED ORDER — LITHIUM CARBONATE ER 450 MG PO TBCR
450.0000 mg | EXTENDED_RELEASE_TABLET | Freq: Two times a day (BID) | ORAL | Status: DC
  Administered 2021-04-14 – 2021-04-15 (×2): 450 mg via ORAL
  Filled 2021-04-14 (×6): qty 1

## 2021-04-14 MED ORDER — DOXEPIN HCL 25 MG PO CAPS: 25.0000 mg | ORAL_CAPSULE | Freq: Every evening | ORAL | Status: DC | PRN

## 2021-04-14 MED ORDER — ALUM & MAG HYDROXIDE-SIMETH 200-200-20 MG/5ML PO SUSP: 30.0000 mL | ORAL | Status: DC | PRN

## 2021-04-14 MED ORDER — MAGNESIUM HYDROXIDE 400 MG/5ML PO SUSP: 30.0000 mL | Freq: Every day | ORAL | Status: DC | PRN

## 2021-04-14 MED ORDER — OLANZAPINE 10 MG PO TABS
20.0000 mg | ORAL_TABLET | Freq: Every day | ORAL | Status: DC
  Administered 2021-04-14: 20 mg via ORAL
  Filled 2021-04-14 (×3): qty 2

## 2021-04-14 NOTE — ED Notes (Signed)
Pt was given 2 grape juices. ?

## 2021-04-14 NOTE — BHH Group Notes (Signed)
Pt did not attend wrap up group this evening. Pt was in their room.  

## 2021-04-14 NOTE — Progress Notes (Signed)
Patient ID: James [name], male   DOB: [birth year], 41 y.o.   MRN: [id number] ? ? ?Initial Treatment Plan ?04/14/2021 ?5:04 PM ?[name] ?ZES:[id number] ? ? ? ?PATIENT STRESSORS: ?Marital or family conflict   ?Medication change or noncompliance   ? ? ?PATIENT STRENGTHS: ?Communication skills  ?Motivation for treatment/growth  ?Supportive family/friends  ? ? ?PATIENT IDENTIFIED PROBLEMS: ?"Not taking medicine"  ?Anger  ?Psychosis  ?  ?  ?  ?  ?  ?  ?  ? ?DISCHARGE CRITERIA:  ?Ability to meet basic life and health needs ?Improved stabilization in mood, thinking, and/or behavior ?Reduction of life-threatening or endangering symptoms to within safe limits ?Verbal commitment to aftercare and medication compliance ? ?PRELIMINARY DISCHARGE PLAN: ?Outpatient therapy ?Return to previous living arrangement ? ?PATIENT/FAMILY INVOLVEMENT: ?This treatment plan has been presented to and reviewed with the patient, James [name], and/or family member.  The patient and family have been given the opportunity to ask questions and make suggestions. ? ?Tania Ade, RN ?04/14/2021, 5:04 PM ?

## 2021-04-14 NOTE — ED Notes (Signed)
Pt was given peas, chicken/dumplings, and juice for lunch. ?

## 2021-04-14 NOTE — ED Notes (Signed)
Pt asleep in bed. Respirations even and unlabored. Will continue to monitor for safety. ?

## 2021-04-14 NOTE — ED Notes (Signed)
Pt. Is walking back and forth on the unit. Will continue to monitor for safety. ?

## 2021-04-14 NOTE — ED Notes (Signed)
Pt continues to quietly pace unit. PRN Atarax and apple juice without difficulty. No signs of acute distress noted. Will continue to monitor for safety. ?
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
## 2021-04-14 NOTE — Progress Notes (Signed)
Per Tommy Medal, [facility], pt has been accepted to [facility] bed 502-01. Accepting provider is Ardell Isaacs, Attending provider is Dr. Loleta Chance. Patient can arrive by 1400. Number for report is [phone]. ? ? ?James [name], MSW, LCSW-A ?Phone: [phone] ?Disposition/TOC ? ? ?

## 2021-04-14 NOTE — ED Notes (Signed)
Pt is awake and alert. He has flat affect and is pacing  . Pt given breakfast.  Denies SI, HI or AVH .  Tina NP in to eval.  Pt  ?

## 2021-04-14 NOTE — ED Notes (Signed)
Pt was given a muffin, cereal, and milk for breakfast. 

## 2021-04-14 NOTE — ED Notes (Signed)
Pt sitting up in bed.  Eating lunch.   No distress noted at this time.   Will continue to monitor for safety.  ?

## 2021-04-14 NOTE — Progress Notes (Signed)
CSW requested [facility] Olasunkanmi, RN to review. Per Doran Heater NP pt is recommended for IP psychiatric treatment. ? ? ?Maryjean Ka, MSW, LCSWA ?04/14/2021 12:47 AM ? ? ?

## 2021-04-14 NOTE — Progress Notes (Addendum)
Patient ID: James [name], male   DOB: Jan 31, 1981, 41 y.o.   MRN: [id number] ? ?Pt alert and oriented during [facility] admission. Pt was calm and cooperative. Pt denies SI/HI, AVH, and any pain. Education, support, reassurance, and encouragement provided, q15 minute safety checks initiated. Pt's belongings in locker #11 and belongings sheet signed. Pt was oriented to the unit and provided with an orientation packet, including pts' rights. Pt denies any concerns at this time, and verbally contracts for safety. Pt ambulating on the unit with no issues. Pt remains safe on the unit. Pt's mother/legal guardian [name] [place] [phone]) was contacted but attempt was unsuccessful.  ? ? ? ?

## 2021-04-14 NOTE — ED Provider Notes (Signed)
Behavioral Health Progress Note  Date and Time: 04/14/2021 8:07 AM Name: James [name] MRN:  [id number]  Subjective: Patient is reassessed, face-to-face, by nurse practitioner.  He states "I am fine." Patient is seated in observation area upon my approach, no apparent distress.  He is alert and oriented, pleasant and cooperative during assessment.  He presents with anxious mood, congruent affect. He continues to deny suicidal and homicidal ideations.  He continues to deny auditory and visual hallucinations. Deklen appears restless, after briefly speaking with this Clinical research associate he stands to slowly pace, patient appears to be speaking under his breath. He endorses average sleep and appetite.  Per medical record review patient appears asleep for approximately 9 hours last night. Attending RN reports that patient is compliant with p.o. medications at this time.  Reviewed medications with patient.  Attempted to discuss long-acting injectable medication as patient's mother/legal guardian requested that patient should resume long-acting injectable.  Patient's mother shares she has difficulty with encouraging patient to get into her car and present to outpatient prescriber's office at [facility] to receive long-acting injectable medication.  Patient declines long-acting injectable medication at this time.  Patient offered support and encouragement.  Reviewed treatment plan to include inpatient psychiatric hospitalization, patient verbalizes an [place] remains under involuntary commitment.  Diagnosis:  Final diagnoses:  Schizophrenia, unspecified type (HCC)    Total Time spent with patient: 20 minutes  Past Psychiatric History: Schizophrenia Past Medical History:  Past Medical History:  Diagnosis Date   Gastroesophageal reflux disease 11/25/2017   No past surgical history on file. Family History:  Family History  Problem Relation Age of Onset   Diabetes Mother    Diabetes Father    Prostate cancer  Father    Family Psychiatric  History:  Social History:  Social History   Substance and Sexual Activity  Alcohol Use No   Alcohol/week: 0.0 standard drinks   Comment: rare alcohol     Social History   Substance and Sexual Activity  Drug Use No    Social History   Socioeconomic History   Marital status: Single    Spouse name: Not on file   Number of children: Not on file   Years of education: Not on file   Highest education level: Not on file  Occupational History   Not on file  Tobacco Use   Smoking status: Every Day    Packs/day: 0.50    Types: Cigarettes   Smokeless tobacco: Never  Vaping Use   Vaping Use: Never used  Substance and Sexual Activity   Alcohol use: No    Alcohol/week: 0.0 standard drinks    Comment: rare alcohol   Drug use: No   Sexual activity: Yes    Comment: male  Other Topics Concern   Not on file  Social History Narrative   Not on file   Social Determinants of Health   Financial Resource Strain: Not on file  Food Insecurity: Not on file  Transportation Needs: Not on file  Physical Activity: Not on file  Stress: Not on file  Social Connections: Not on file   SDOH:  SDOH Screenings   Alcohol Screen: Low Risk    Last Alcohol Screening Score (AUDIT): 1  Depression (PHQ2-9): Low Risk    PHQ-2 Score: 0  Financial Resource Strain: Not on file  Food Insecurity: Not on file  Housing: Not on file  Physical Activity: Not on file  Social Connections: Not on file  Stress: Not on file  Tobacco  Use: High Risk   Smoking Tobacco Use: Every Day   Smokeless Tobacco Use: Never   Passive Exposure: Not on file  Transportation Needs: Not on file   Additional Social History:    Pain Medications: see MAR Prescriptions: see MAR Over the Counter: see MAR History of alcohol / drug use?: No history of alcohol / drug abuse                    Sleep: Good  Appetite:  Good  Current Medications:  Current Facility-Administered  Medications  Medication Dose Route Frequency Provider Last Rate Last Admin   acetaminophen (TYLENOL) tablet 650 mg  650 mg Oral Q6H PRN Lenard Lance, FNP       alum & mag hydroxide-simeth (MAALOX/MYLANTA) 200-200-20 MG/5ML suspension 30 mL  30 mL Oral Q4H PRN Lenard Lance, FNP       benztropine (COGENTIN) tablet 0.5 mg  0.5 mg Oral BID PRN Lenard Lance, FNP   0.5 mg at 04/14/21 0805   doxepin (SINEQUAN) capsule 25 mg  25 mg Oral QHS PRN Lenard Lance, FNP       hydrOXYzine (ATARAX) tablet 25 mg  25 mg Oral TID PRN Lenard Lance, FNP   25 mg at 04/14/21 0556   OLANZapine zydis (ZYPREXA) disintegrating tablet 10 mg  10 mg Oral Q8H PRN Lenard Lance, FNP       And   LORazepam (ATIVAN) tablet 1 mg  1 mg Oral PRN Lenard Lance, FNP       And   ziprasidone (GEODON) injection 20 mg  20 mg Intramuscular PRN Lenard Lance, FNP       magnesium hydroxide (MILK OF MAGNESIA) suspension 30 mL  30 mL Oral Daily PRN Lenard Lance, FNP       metFORMIN (GLUCOPHAGE) tablet 500 mg  500 mg Oral BID WC Lenard Lance, FNP   500 mg at 04/14/21 0726   OLANZapine (ZYPREXA) tablet 10 mg  10 mg Oral Daily Lenard Lance, FNP   10 mg at 04/14/21 0802   OLANZapine (ZYPREXA) tablet 20 mg  20 mg Oral QHS Lenard Lance, FNP   20 mg at 04/13/21 2131   traZODone (DESYREL) tablet 50 mg  50 mg Oral QHS PRN Lenard Lance, FNP       Current Outpatient Medications  Medication Sig Dispense Refill   ARIPiprazole (ABILIFY) 10 MG tablet Take 1 tablet (10 mg total) by mouth daily. (Take for 10 more days, then stop): For mood control 10 tablet 0   ARIPiprazole ER (ABILIFY MAINTENA) 400 MG SRER injection Inject 2 mLs (400 mg total) into the muscle every 28 (twenty-eight) days. (Due on 08-24-20): For mood control 1 each 0   benztropine (COGENTIN) 0.5 MG tablet Take 1 tablet (0.5 mg total) by mouth 2 (two) times daily as needed for tremors (EPS). 60 tablet 0   doxepin (SINEQUAN) 25 MG capsule Take 1 capsule (25 mg total) by mouth at  bedtime as needed (insomnia). 30 capsule 0   famotidine (PEPCID) 20 MG tablet Take 1 tablet (20 mg total) by mouth 2 (two) times daily. For acid reflux 15 tablet 0   lithium carbonate (ESKALITH) 450 MG CR tablet Take 1 tablet (450 mg total) by mouth 2 (two) times daily. For mood stabilization 60 tablet 0   metFORMIN (GLUCOPHAGE) 500 MG tablet TAKE 1 TABLET BY MOUTH TWICE DAILY WITH MEALS 180 tablet 0  OLANZapine (ZYPREXA) 10 MG tablet Take 1 tablet (10 mg total) by mouth daily. For mood control 30 tablet 0   OLANZapine (ZYPREXA) 20 MG tablet Take 1 tablet (20 mg total) by mouth at bedtime. For mood control 30 tablet 0    Labs  Lab Results:  Admission on 04/13/2021  Component Date Value Ref Range Status   SARS Coronavirus 2 by RT PCR 04/13/2021 NEGATIVE  NEGATIVE Final   Comment: (NOTE) SARS-CoV-2 target nucleic acids are NOT DETECTED.  The SARS-CoV-2 RNA is generally detectable in upper respiratory specimens during the acute phase of infection. The lowest concentration of SARS-CoV-2 viral copies this assay can detect is 138 copies/mL. A negative result does not preclude SARS-Cov-2 infection and should not be used as the sole basis for treatment or other patient management decisions. A negative result may occur with  improper specimen collection/handling, submission of specimen other than nasopharyngeal swab, presence of viral mutation(s) within the areas targeted by this assay, and inadequate number of viral copies(<138 copies/mL). A negative result must be combined with clinical observations, patient history, and epidemiological information. The expected result is Negative.  Fact Sheet for Patients:  BloggerCourse.com  Fact Sheet for Healthcare Providers:  SeriousBroker.it  This test is no                          t yet approved or cleared by the [place] FDA and  has been authorized for detection and/or diagnosis of  SARS-CoV-2 by FDA under an Emergency Use Authorization (EUA). This EUA will remain  in effect (meaning this test can be used) for the duration of the COVID-19 declaration under Section 564(b)(1) of the Act, 21 U.S.C.section 360bbb-3(b)(1), unless the authorization is terminated  or revoked sooner.       Influenza A by PCR 04/13/2021 NEGATIVE  NEGATIVE Final   Influenza B by PCR 04/13/2021 NEGATIVE  NEGATIVE Final   Comment: (NOTE) The Xpert Xpress SARS-CoV-2/FLU/RSV plus assay is intended as an aid in the diagnosis of influenza from Nasopharyngeal swab specimens and should not be used as a sole basis for treatment. Nasal washings and aspirates are unacceptable for Xpert Xpress SARS-CoV-2/FLU/RSV testing.  Fact Sheet for Patients: BloggerCourse.com  Fact Sheet for Healthcare Providers: SeriousBroker.it  This test is not yet approved or cleared by the [place] FDA and has been authorized for detection and/or diagnosis of SARS-CoV-2 by FDA under an Emergency Use Authorization (EUA). This EUA will remain in effect (meaning this test can be used) for the duration of the COVID-19 declaration under Section 564(b)(1) of the Act, 21 U.S.C. section 360bbb-3(b)(1), unless the authorization is terminated or revoked.  Performed at [facility], [address], [place], [place] [address]    WBC 04/13/2021 6.1  4.0 - 10.5 K/uL Final   RBC 04/13/2021 5.07  4.22 - 5.81 MIL/uL Final   Hemoglobin 04/13/2021 14.7  13.0 - 17.0 g/dL Final   HCT 60/45/4098 44.6  39.0 - 52.0 % Final   MCV 04/13/2021 88.0  80.0 - 100.0 fL Final   MCH 04/13/2021 29.0  26.0 - 34.0 pg Final   MCHC 04/13/2021 33.0  30.0 - 36.0 g/dL Final   RDW 11/91/4782 13.9  11.5 - 15.5 % Final   Platelets 04/13/2021 242  150 - 400 K/uL Final   nRBC 04/13/2021 0.0  0.0 - 0.2 % Final   Neutrophils Relative % 04/13/2021 54  % Final   Neutro Abs 04/13/2021  3.3  1.7 - 7.7  K/uL Final   Lymphocytes Relative 04/13/2021 35  % Final   Lymphs Abs 04/13/2021 2.1  0.7 - 4.0 K/uL Final   Monocytes Relative 04/13/2021 8  % Final   Monocytes Absolute 04/13/2021 0.5  0.1 - 1.0 K/uL Final   Eosinophils Relative 04/13/2021 2  % Final   Eosinophils Absolute 04/13/2021 0.1  0.0 - 0.5 K/uL Final   Basophils Relative 04/13/2021 1  % Final   Basophils Absolute 04/13/2021 0.0  0.0 - 0.1 K/uL Final   Immature Granulocytes 04/13/2021 0  % Final   Abs Immature Granulocytes 04/13/2021 0.01  0.00 - 0.07 K/uL Final   Performed at [facility], [address] [place], [place] [address]   Sodium 04/13/2021 143  135 - 145 mmol/L Final   Potassium 04/13/2021 3.8  3.5 - 5.1 mmol/L Final   Chloride 04/13/2021 109  98 - 111 mmol/L Final   CO2 04/13/2021 22  22 - 32 mmol/L Final   Glucose, Bld 04/13/2021 93  70 - 99 mg/dL Final   Glucose reference range applies only to samples taken after fasting for at least 8 hours.   BUN 04/13/2021 10  6 - 20 mg/dL Final   Creatinine, Ser 04/13/2021 0.97  0.61 - 1.24 mg/dL Final   Calcium 60/45/409803/11/2021 9.6  8.9 - 10.3 mg/dL Final   Total Protein 11/91/478203/11/2021 7.1  6.5 - 8.1 g/dL Final   Albumin 95/62/130803/11/2021 4.0  3.5 - 5.0 g/dL Final   AST 65/78/469603/11/2021 19  15 - 41 U/L Final   ALT 04/13/2021 22  0 - 44 U/L Final   Alkaline Phosphatase 04/13/2021 74  38 - 126 U/L Final   Total Bilirubin 04/13/2021 0.4  0.3 - 1.2 mg/dL Final   GFR, Estimated 04/13/2021 >60  >60 mL/min Final   Comment: (NOTE) Calculated using the CKD-EPI Creatinine Equation (2021)    Anion gap 04/13/2021 12  5 - 15 Final   Performed at [facility], [address] [place], [place] [address]   Magnesium 04/13/2021 1.8  1.7 - 2.4 mg/dL Final   Performed at [facility], [address] [place], [place] [address]   Alcohol, Ethyl (B) 04/13/2021 <10  <10 mg/dL Final   Comment: (NOTE) Lowest detectable limit for serum alcohol is 10 mg/dL.  For medical purposes  only. Performed at [facility], [address] [place], [place] [address]    Cholesterol 04/13/2021 174  0 - 200 mg/dL Final   Triglycerides 72/53/664403/11/2021 67  <150 mg/dL Final   HDL 03/47/425903/11/2021 47  >40 mg/dL Final   Total CHOL/HDL Ratio 04/13/2021 3.7  RATIO Final   VLDL 04/13/2021 13  0 - 40 mg/dL Final   LDL Cholesterol 04/13/2021 114 (H)  0 - 99 mg/dL Final   Comment:        Total Cholesterol/HDL:CHD Risk Coronary Heart Disease Risk Table                     Men   Women  1/2 Average Risk   3.4   3.3  Average Risk       5.0   4.4  2 X Average Risk   9.6   7.1  3 X Average Risk  23.4   11.0        Use the calculated Patient Ratio above and the CHD Risk Table to determine the patient's CHD Risk.  ATP III CLASSIFICATION (LDL):  <100     mg/dL   Optimal  098-119  mg/dL   Near or Above                    Optimal  130-159  mg/dL   Borderline  147-829  mg/dL   High  >562     mg/dL   Very High Performed at [facility], [address], [place], [place] [address]    TSH 04/13/2021 1.079  0.350 - 4.500 uIU/mL Final   Comment: Performed by a 3rd Generation assay with a functional sensitivity of <=0.01 uIU/mL. Performed at [facility], [address] [place], [place] [address]    POC Amphetamine UR 04/13/2021 None Detected  NONE DETECTED (Cut Off Level 1000 ng/mL) Final   POC Secobarbital (BAR) 04/13/2021 None Detected  NONE DETECTED (Cut Off Level 300 ng/mL) Final   POC Buprenorphine (BUP) 04/13/2021 None Detected  NONE DETECTED (Cut Off Level 10 ng/mL) Final   POC Oxazepam (BZO) 04/13/2021 None Detected  NONE DETECTED (Cut Off Level 300 ng/mL) Final   POC Cocaine UR 04/13/2021 None Detected  NONE DETECTED (Cut Off Level 300 ng/mL) Final   POC Methamphetamine UR 04/13/2021 None Detected  NONE DETECTED (Cut Off Level 1000 ng/mL) Final   POC Morphine 04/13/2021 None Detected  NONE DETECTED (Cut Off Level 300 ng/mL) Final   POC Oxycodone UR 04/13/2021 None  Detected  NONE DETECTED (Cut Off Level 100 ng/mL) Final   POC Methadone UR 04/13/2021 None Detected  NONE DETECTED (Cut Off Level 300 ng/mL) Final   POC Marijuana UR 04/13/2021 None Detected  NONE DETECTED (Cut Off Level 50 ng/mL) Final   SARS Coronavirus 2 Ag 04/13/2021 Negative  Negative Final   Lithium Lvl 04/13/2021 0.10 (L)  0.60 - 1.20 mmol/L Final   Performed at [facility], [address], [place], [place] [address]  Office Visit on 03/30/2021  Component Date Value Ref Range Status   Sodium 03/30/2021 140  135 - 145 mEq/L Final   Potassium 03/30/2021 4.2  3.5 - 5.1 mEq/L Final   Chloride 03/30/2021 108  96 - 112 mEq/L Final   CO2 03/30/2021 30  19 - 32 mEq/L Final   Glucose, Bld 03/30/2021 92  70 - 99 mg/dL Final   BUN 28/41/3244 9  6 - 23 mg/dL Final   Creatinine, Ser 03/30/2021 0.94  0.40 - 1.50 mg/dL Final   Total Bilirubin 03/30/2021 0.4  0.2 - 1.2 mg/dL Final   Alkaline Phosphatase 03/30/2021 74  39 - 117 U/L Final   AST 03/30/2021 15  0 - 37 U/L Final   ALT 03/30/2021 20  0 - 53 U/L Final   Total Protein 03/30/2021 6.6  6.0 - 8.3 g/dL Final   Albumin 02/06/7251 4.3  3.5 - 5.2 g/dL Final   GFR 66/44/0347 100.33  >60.00 mL/min Final   Calculated using the CKD-EPI Creatinine Equation (2021)   Calcium 03/30/2021 9.4  8.4 - 10.5 mg/dL Final   Hgb Q2V MFr Bld 03/30/2021 5.9  4.6 - 6.5 % Final   Glycemic Control Guidelines for People with Diabetes:Non Diabetic:  <6%Goal of Therapy: <7%Additional Action Suggested:  >8%     Blood Alcohol level:  Lab Results  Component Value Date   ETH <10 04/13/2021   ETH <10 07/16/2020    Metabolic Disorder Labs: Lab Results  Component Value Date   HGBA1C 5.9 03/30/2021   MPG 120 11/23/2019  MPG 111.15 06/27/2018   Lab Results  Component Value Date   PROLACTIN 3.3 (L) 11/23/2019   Lab Results  Component Value Date   CHOL 174 04/13/2021   TRIG 67 04/13/2021   HDL 47 04/13/2021   CHOLHDL 3.7 04/13/2021   VLDL 13  04/13/2021   LDLCALC 114 (H) 04/13/2021   LDLCALC 74 07/22/2020    Therapeutic Lab Levels: Lab Results  Component Value Date   LITHIUM 0.10 (L) 04/13/2021   LITHIUM 0.58 (L) 07/25/2020   No results found for: VALPROATE No components found for:  CBMZ  Physical Findings   AIMS    Flowsheet Row [facility] (Dis[facility]) [facility] Admission (Discharged) from OP Visit from 06/26/2018 [facility]  AIMS Total Score 0 0 0      AUDIT    Flowsheet Row [facility] Ad[facility] (Dis[facility]  Alcohol Use Disorder Identification Test Final Score (AUDIT) 1 0      Mini-Mental    Flowsheet Row Clinical Support from 08/05/2017 in [facility]  Total Score (max 30 points ) 30      PHQ2-9    Flowsheet Row Office Visit from 03/30/2021 in [facility] ED from 11/20/2019 in [facility] Clinical Support from 08/11/2018 in [facility] Clinical Support from 08/05/2017 in [facility] Office Visit from 01/21/2017 in [facility]  PHQ-2 Total Score 0 0 0 0 0  PHQ-9 Total Score -- 2 -- -- --      Flowsheet Row ED from 04/13/2021 in [facility] (Discharged) [facility] from 07/16/2020 in [facility] EMERGENCY DEPART[facility]  C-SSRS RISK CATEGORY No Risk Error: Q3, 4, or 5 should not be populated when Q2 is No High Risk        Musculoskeletal  Strength & Muscle  Tone: within normal limits Gait & Station: normal Patient leans: N/A  Psychiatric Specialty Exam  Presentation  General Appearance: Disheveled  Eye Contact:Fair  Speech:Clear and Coherent; Normal Rate  Speech Volume:Normal  Handedness:Right   Mood and Affect  Mood:Anxious  Affect:Congruent   Thought Process  Thought Processes:Coherent; Goal Directed; Linear  Descriptions of Associations:Intact  Orientation:Full (Time, Place and Person)  Thought Content:Logical  Diagnosis of Schizophrenia or Schizoaffective disorder in past: Yes  Duration of Psychotic Symptoms: Greater than six months   Hallucinations:Hallucinations: None  Ideas of Reference:None  Suicidal Thoughts:Suicidal Thoughts: No  Homicidal Thoughts:Homicidal Thoughts: No   Sensorium  Memory:Immediate Good  Judgment:Intact  Insight:Present   Executive Functions  Concentration:Poor  Attention Span:Fair  Recall:Fair  Fund of Knowledge:Good  Language:Good   Psychomotor Activity  Psychomotor Activity:Psychomotor Activity: Restlessness   Assets  Assets:Communication Skills; Financial Resources/Insurance; Housing; Physical Health; Resilience; Social Support   Sleep  Sleep:Sleep: Fair   Nutritional Assessment (For OBS and [facility] admissions only) Has the patient had a weight loss or gain of 10 pounds or more in the last 3 months?: No Has the patient had a decrease in food intake/or appetite?: No Does the patient have dental problems?: No Does the patient  have eating habits or behaviors that may be indicators of an eating disorder including binging or inducing vomiting?: No Has the patient recently lost weight without trying?: 0 Has the patient been eating poorly because of a decreased appetite?: 0 Malnutrition Screening Tool Score: 0    Physical Exam  Physical Exam Vitals and nursing note reviewed.  Constitutional:      Appearance: Normal appearance. He is well-developed.  HENT:      Head: Normocephalic and atraumatic.  Cardiovascular:     Rate and Rhythm: Normal rate.  Pulmonary:     Effort: Pulmonary effort is normal.  Musculoskeletal:        General: Normal range of motion.     Cervical back: Normal range of motion.  Skin:    General: Skin is warm and dry.  Neurological:     Mental Status: He is alert and oriented to person, place, and time.  Psychiatric:        Attention and Perception: Attention and perception normal.        Mood and Affect: Affect normal. Mood is anxious.        Speech: Speech normal.        Behavior: Behavior is cooperative.        Thought Content: Thought content normal.        Cognition and Memory: Cognition normal.   Review of Systems  Constitutional: Negative.   HENT: Negative.    Eyes: Negative.   Respiratory: Negative.    Cardiovascular: Negative.   Gastrointestinal: Negative.   Genitourinary: Negative.   Musculoskeletal: Negative.   Skin: Negative.   Neurological: Negative.   Endo/Heme/Allergies: Negative.   Psychiatric/Behavioral:  The patient is nervous/anxious.   Blood pressure 111/89, pulse 71, temperature 98.2 F (36.8 C), temperature source Oral, resp. rate 18, SpO2 100 %. There is no height or weight on file to calculate BMI.  Treatment Plan Summary: Daily contact with patient to assess and evaluate symptoms and progress in treatment Patient reviewed with Dr. Nelly Rout.  Continue to recommend inpatient psychiatric treatment.  Lenard Lance, FNP 04/14/2021 8:07 AM

## 2021-04-14 NOTE — ED Notes (Signed)
Pt up quietly pacing unit. Pt declined offer for PRN anxiety medication at this time. No signs of acute distress noted. Will continue to monitor for safety. ?

## 2021-04-15 ENCOUNTER — Encounter (HOSPITAL_COMMUNITY): Payer: Self-pay | Admitting: Family

## 2021-04-15 DIAGNOSIS — F200 Paranoid schizophrenia: Secondary | ICD-10-CM

## 2021-04-15 MED ORDER — LITHIUM CITRATE 300 MG/5 ML PO SYRP
450.0000 mg | Freq: Two times a day (BID) | ORAL | Status: DC
Start: 2021-04-15 — End: 2021-04-15

## 2021-04-15 MED ORDER — OLANZAPINE 10 MG PO TBDP
10.0000 mg | ORAL_TABLET | Freq: Every morning | ORAL | Status: DC
  Administered 2021-04-16 – 2021-04-25 (×10): 10 mg via ORAL
  Filled 2021-04-15 (×12): qty 1

## 2021-04-15 MED ORDER — BENZTROPINE MESYLATE 1 MG PO TABS
1.0000 mg | ORAL_TABLET | Freq: Four times a day (QID) | ORAL | Status: DC | PRN
Start: 2021-04-15 — End: 2021-04-19
  Administered 2021-04-15: 1 mg via ORAL
  Filled 2021-04-15: qty 1

## 2021-04-15 MED ORDER — BENZTROPINE MESYLATE 1 MG PO TABS: 1.0000 mg | ORAL_TABLET | Freq: Four times a day (QID) | ORAL | Status: DC | PRN

## 2021-04-15 MED ORDER — OLANZAPINE 10 MG PO TBDP
20.0000 mg | ORAL_TABLET | Freq: Every day | ORAL | Status: DC
  Administered 2021-04-15 – 2021-04-20 (×5): 20 mg via ORAL
  Filled 2021-04-15 (×8): qty 2

## 2021-04-15 MED ORDER — LITHIUM CARBONATE ER 450 MG PO TBCR
450.0000 mg | EXTENDED_RELEASE_TABLET | Freq: Two times a day (BID) | ORAL | Status: DC
  Filled 2021-04-15 (×4): qty 1

## 2021-04-15 MED ORDER — LORAZEPAM 2 MG/ML PO CONC: 1.0000 mg | Freq: Every evening | ORAL | Status: DC | PRN

## 2021-04-15 MED ORDER — ENSURE ENLIVE PO LIQD
237.0000 mL | Freq: Two times a day (BID) | ORAL | Status: DC
  Administered 2021-04-15 – 2021-04-25 (×20): 237 mL via ORAL
  Filled 2021-04-15 (×22): qty 237

## 2021-04-15 MED ORDER — HALOPERIDOL 5 MG PO TABS
5.0000 mg | ORAL_TABLET | Freq: Four times a day (QID) | ORAL | Status: DC | PRN
  Administered 2021-04-15 (×2): 5 mg via ORAL
  Filled 2021-04-15: qty 1

## 2021-04-15 MED ORDER — LORAZEPAM 2 MG/ML IJ SOLN: 2.0000 mg | Freq: Four times a day (QID) | INTRAMUSCULAR | Status: DC | PRN

## 2021-04-15 MED ORDER — LITHIUM CARBONATE ER 450 MG PO TBCR
450.0000 mg | EXTENDED_RELEASE_TABLET | Freq: Two times a day (BID) | ORAL | Status: DC
  Administered 2021-04-15 – 2021-04-25 (×19): 450 mg via ORAL
  Filled 2021-04-15 (×22): qty 1

## 2021-04-15 MED ORDER — HALOPERIDOL 5 MG PO TABS
ORAL_TABLET | ORAL | Status: AC
  Filled 2021-04-15: qty 1

## 2021-04-15 MED ORDER — HALOPERIDOL LACTATE 5 MG/ML IJ SOLN: 5.0000 mg | Freq: Four times a day (QID) | INTRAMUSCULAR | Status: DC | PRN

## 2021-04-15 MED ORDER — LORAZEPAM 1 MG PO TABS
2.0000 mg | ORAL_TABLET | Freq: Four times a day (QID) | ORAL | Status: DC | PRN
  Administered 2021-04-15: 2 mg via ORAL
  Filled 2021-04-15: qty 2

## 2021-04-15 NOTE — [facility] Group Notes (Signed)
LCSW Group Therapy Note ? ?No social work group was held today due to a scheduling conflict.   ? ?Ora Mcnatt Grossman-Orr, LCSW ?11/25/2020 ?5:03 PM  ?  ? ?

## 2021-04-15 NOTE — Progress Notes (Signed)
Pt has been responding to internal stimuli, pacing nonstop back and forth from the hall to the dayroom and standing in front of patients while they are trying to watch television. While pacing, pt has been seen displaying bizarre behavior such as tapping the walls, skipping, mumbling to himself, randomly laughing and standing in place pointing up at the ceiling. When pt is redirected, he just gives a blank stare and goes on with what he was doing. Pt will continued to be monitored and redirected as needed on the unit.  ?
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
## 2021-04-15 NOTE — Group Note (Unsigned)
Date:  04/15/2021 ?Time:  6:24 PM ? ?Group Topic/Focus:  ?Goals Group:   The focus of this group is to help patients establish daily goals to achieve during treatment and discuss how the patient can incorporate goal setting into their daily lives to aide in recovery. ? ? ? ? ?Participation Level:  {[facility] PARTICIPATION LEVEL:22264} ? ?Participation Quality:  {[facility] PARTICIPATION QUALITY:22265} ? ?Affect:  {[facility] AFFECT:22266} ? ?Cognitive:  {B[facility] COGNITIVE:22267} ? ?Insight: {BHH Insight2:20797} ? ?Engagement in Group:  {[facility] ENGAGEMENT IN GROUP:22268} ? ?Modes of Intervention:  {BHH MODES OF INTERVENTION:22269} ? ?Additional Comments:  *** ? ?[name] ?04/15/2021, 6:24 PM ? ?
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
## 2021-04-15 NOTE — Progress Notes (Signed)
Adult Psychoeducational Group Note ? ?Date:  04/15/2021 ?Time:  8:40 PM ? ?Group Topic/Focus:  ?Wrap-Up Group:   The focus of this group is to help patients review their daily goal of treatment and discuss progress on daily workbooks. ? ?Participation Level:  Did Not Attend ? ?Participation Quality:   Did Not Attend ? ?Affect:   Did Not Attend ? ?Cognitive:   Did Not Attend ? ?Insight: None ? ?Engagement in Group:   Did Not Attend ? ?Modes of Intervention:   Did Not Attend ? ?Additional Comments:  Pt was encouraged to attend wrap up group but did not attend. ? ?James [name] ?04/15/2021, 8:40 PM ?

## 2021-04-15 NOTE — Progress Notes (Signed)
James [name] has been in his room all evening.  He was noted snoring loudly.  RN had to wake him up to take his bedtime medications.  No evidence of cheeking and he opened his mouth to let RN inspect.  He denied SI/HI but continued to endorse AVH.  He denied any pain or discomfort and appeared to be in no  physical distress.  Q 15 minutes maintained for safety. ? ? 04/15/21 2220  ?Psych Admission Type (Psych Patients Only)  ?Admission Status Involuntary  ?Psychosocial Assessment  ?Patient Complaints None  ?Eye Contact Brief  ?Facial Expression Flat  ?Affect Flat  ?Speech Logical/coherent  ?Interaction Avoidant;Minimal;No initiation  ?Motor Activity Slow  ?Appearance/Hygiene Unremarkable  ?Behavior Characteristics Calm  ?Mood Euthymic  ?Thought Process  ?Coherency Blocking  ?Content Delusions;Paranoia  ?Delusions Paranoid  ?Perception Hallucinations  ?Hallucination Auditory;Visual  ?Judgment Poor  ?Confusion None  ?Danger to Self  ?Current suicidal ideation? Denies  ?Danger to Others  ?Danger to Others None reported or observed  ? ? ?

## 2021-04-15 NOTE — Progress Notes (Addendum)
Pt denies SI/HI/AVH and verbally agrees to approach staff if these become apparent or before harming themselves/others. Rates depression 0/10. Rates anxiety 0/10. Rates pain 0/10. Pt has been actively responding throughout the day. Pt is seen laughing and talking to self. Pt has been pacing all day long. Pt was given PRNs and was made to sit in the dayroom with a MHT. Pt also showed RN mouth and medicine was not seen. PRNs did not have any effect. Pt is extremely restless, could barely sit for 7 minutes without wanting to get up again. Pt went to sleep around 1815. Scheduled medications administered to pt, per MD orders. RN provided support and encouragement to pt. Q15 min safety checks implemented and continued. Pt safe on the unit. RN will continue to monitor and intervene as needed.  ? 04/15/21 0731  ?Psych Admission Type (Psych Patients Only)  ?Admission Status Involuntary  ?Psychosocial Assessment  ?Patient Complaints None  ?Eye Contact Fair  ?Facial Expression Flat  ?Affect Flat  ?Speech Logical/coherent  ?Interaction Guarded;Minimal;Forwards little;Cautious  ?Motor Activity Pacing  ?Appearance/Hygiene Unremarkable  ?Behavior Characteristics Cooperative;Calm;Pacing  ?Mood Anxious;Pleasant  ?Thought Process  ?Coherency Blocking  ?Content Delusions;Paranoia  ?Delusions Paranoid  ?Perception Hallucinations  ?Hallucination Auditory;Visual  ?Judgment Poor  ?Confusion None  ?Danger to Self  ?Current suicidal ideation? Denies  ?Danger to Others  ?Danger to Others None reported or observed  ? ? ?

## 2021-04-15 NOTE — Progress Notes (Signed)

## 2021-04-15 NOTE — Group Note (Unsigned)
Date:  04/15/2021 ?Time:  11:24 AM ? ?Group Topic/Focus:  ?Orientation:   The focus of this group is to educate the patient on the purpose and policies of crisis stabilization and provide a format to answer questions about their admission.  The group details unit policies and expectations of patients while admitted. ? ? ? ? ?Participation Level:  {BHH PARTICIPATION LEVEL:22264} ? ?Participation Quality:  {BHH PARTICIPATION QUALITY:22265} ? ?Affect:  {BHH AFFECT:22266} ? ?Cognitive:  {BHH COGNITIVE:22267} ? ?Insight: {BHH Insight2:20797} ? ?Engagement in Group:  {BHH ENGAGEMENT IN GROUP:22268} ? ?Modes of Intervention:  {BHH MODES OF INTERVENTION:22269} ? ?Additional Comments:  *** ? ?James [name] ?04/15/2021, 11:24 AM ? ?

## 2021-04-16 ENCOUNTER — Encounter (HOSPITAL_COMMUNITY): Payer: Self-pay

## 2021-04-16 MED ORDER — LORAZEPAM 1 MG PO TABS: 1.0000 mg | ORAL_TABLET | Freq: Every evening | ORAL | Status: DC | PRN
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
## 2021-04-16 NOTE — [facility] Suicide Risk Assessment (Signed)
[facility] INPATIENT:  Family/Significant Other Suicide Prevention Education ? ?Suicide Prevention Education:  ?Education Completed; mother and legal guardian, James [name] [phone]), has been identified by the patient as the family member/significant other with whom the patient will be residing, and identified as the person(s) who will aid the patient in the event of a mental health crisis (suicidal ideations/suicide attempt).  With written consent from the patient, the family member/significant other has been provided the following suicide prevention education, prior to the and/or following the discharge of the patient. ? ?CSW spoke with patients mother/legal guardian. She reports that this patient is able to return home at discharge once he has received an LAI. Patients mother is interested in this patient being referred to ACTT services for outpatient services. ? ?The suicide prevention education provided includes the following: ?Suicide risk factors ?Suicide prevention and interventions ?National Suicide Hotline telephone number ?[facility] assessment telephone number ?[facility] Emergency Assistance 911 ?[place] and/or Residential Mobile Crisis Unit telephone number ? ?Request made of family/significant other to: ?Remove weapons (e.g., guns, rifles, knives), all items previously/currently identified as safety concern.   ?Remove drugs/medications (over-the-counter, prescriptions, illicit drugs), all items previously/currently identified as a safety concern. ? ?The family member/significant other verbalizes understanding of the suicide prevention education information provided.  The family member/significant other agrees to remove the items of safety concern listed above. ? ?James [name] ?04/16/2021, 1:03 PM ?

## 2021-04-16 NOTE — Progress Notes (Signed)
Pt was encouraged but didn't attend therapeutic relaxation group. ?

## 2021-04-16 NOTE — Progress Notes (Signed)
Pt was encouraged but didn't attend orientation/goals group. ?

## 2021-04-16 NOTE — BH IP Treatment Plan (Signed)
Interdisciplinary Treatment and Diagnostic Plan Update  04/16/2021 Time of Session: 11:00am James [name] MRN: [id number]  Principal Diagnosis: Schizophrenia [facility])  Secondary Diagnoses: Principal Problem:   Schizophrenia ([place])   Current Medications:  Current Facility-Administered Medications  Medication Dose Route Frequency Provider Last Rate Last Admin   acetaminophen (TYLENOL) tablet 650 mg  650 mg Oral Q6H PRN Lucky Rathke, FNP       alum & mag hydroxide-simeth (MAALOX/MYLANTA) 200-200-20 MG/5ML suspension 30 mL  30 mL Oral Q4H PRN Lucky Rathke, FNP       benztropine (COGENTIN) tablet 0.5 mg  0.5 mg Oral BID PRN Lucky Rathke, FNP       haloperidol (HALDOL) tablet 5 mg  5 mg Oral Q6H PRN Maida Sale, MD   5 mg at 04/15/21 1553   And   LORazepam (ATIVAN) tablet 2 mg  2 mg Oral Q6H PRN Maida Sale, MD   2 mg at 04/15/21 1553   And   benztropine (COGENTIN) tablet 1 mg  1 mg Oral Q6H PRN Maida Sale, MD   1 mg at 04/15/21 1555   haloperidol lactate (HALDOL) injection 5 mg  5 mg Intramuscular Q6H PRN Maida Sale, MD       And   LORazepam (ATIVAN) injection 2 mg  2 mg Intramuscular Q6H PRN Hill, Jackie Plum, MD       And   benztropine (COGENTIN) tablet 1 mg  1 mg Oral Q6H PRN Hill, Jackie Plum, MD       feeding supplement (ENSURE ENLIVE / ENSURE PLUS) liquid 237 mL  237 mL Oral BID BM Hill, Jackie Plum, MD   237 mL at 04/16/21 1045   lithium carbonate (ESKALITH) CR tablet 450 mg  450 mg Oral Q12H Hill, Jackie Plum, MD   450 mg at 04/16/21 0753   LORazepam (ATIVAN) tablet 1 mg  1 mg Oral QHS PRN Maida Sale, MD       magnesium hydroxide (MILK OF MAGNESIA) suspension 30 mL  30 mL Oral Daily PRN Lucky Rathke, FNP       metFORMIN (GLUCOPHAGE) tablet 500 mg  500 mg Oral BID WC Lucky Rathke, FNP   500 mg at 04/16/21 0753   OLANZapine zydis (ZYPREXA) disintegrating tablet 10 mg  10 mg Oral q AM Hill, Jackie Plum, MD    10 mg at 04/16/21 0754   OLANZapine zydis (ZYPREXA) disintegrating tablet 20 mg  20 mg Oral QHS Hill, Jackie Plum, MD   20 mg at 04/15/21 2223   PTA Medications: Medications Prior to Admission  Medication Sig Dispense Refill Last Dose   benztropine (COGENTIN) 0.5 MG tablet Take 1 tablet (0.5 mg total) by mouth 2 (two) times daily as needed for tremors (EPS). 60 tablet 0    cholecalciferol (VITAMIN D3) 25 MCG (1000 UNIT) tablet Take 2,000 Units by mouth daily.      doxepin (SINEQUAN) 25 MG capsule Take 1 capsule (25 mg total) by mouth at bedtime as needed (insomnia). 30 capsule 0    lithium carbonate (ESKALITH) 450 MG CR tablet Take 1 tablet (450 mg total) by mouth 2 (two) times daily. For mood stabilization 60 tablet 0    metFORMIN (GLUCOPHAGE) 500 MG tablet TAKE 1 TABLET BY MOUTH TWICE DAILY WITH MEALS (Patient taking differently: Take 500 mg by mouth 2 (two) times daily with a meal.) 180 tablet 0    Multiple Vitamins-Minerals (MULTIVITAMIN WITH MINERALS) tablet Take 1 tablet  by mouth daily.      OLANZapine (ZYPREXA) 10 MG tablet Take 1 tablet (10 mg total) by mouth daily. For mood control 30 tablet 0    OLANZapine (ZYPREXA) 20 MG tablet Take 1 tablet (20 mg total) by mouth at bedtime. For mood control 30 tablet 0     Patient Stressors: Marital or family conflict   Medication change or noncompliance    Patient Strengths: Hydrographic surveyor for treatment/growth  Supportive family/friends   Treatment Modalities: Medication Management, Group therapy, Case management,  1 to 1 session with clinician, Psychoeducation, Recreational therapy.   Physician Treatment Plan for Primary Diagnosis: Schizophrenia ([place]) Long Term Goal(s): Improvement in symptoms so as ready for discharge   Short Term Goals: Ability to identify changes in lifestyle to reduce recurrence of condition will improve Ability to verbalize feelings will improve Ability to disclose and discuss suicidal  ideas Ability to demonstrate self-control will improve Ability to identify and develop effective coping behaviors will improve Ability to maintain clinical measurements within normal limits will improve Compliance with prescribed medications will improve  Medication Management: Evaluate patient's response, side effects, and tolerance of medication regimen.  Therapeutic Interventions: 1 to 1 sessions, Unit Group sessions and Medication administration.  Evaluation of Outcomes: Not Met  Physician Treatment Plan for Secondary Diagnosis: Principal Problem:   Schizophrenia ([place])  Long Term Goal(s): Improvement in symptoms so as ready for discharge   Short Term Goals: Ability to identify changes in lifestyle to reduce recurrence of condition will improve Ability to verbalize feelings will improve Ability to disclose and discuss suicidal ideas Ability to demonstrate self-control will improve Ability to identify and develop effective coping behaviors will improve Ability to maintain clinical measurements within normal limits will improve Compliance with prescribed medications will improve     Medication Management: Evaluate patient's response, side effects, and tolerance of medication regimen.  Therapeutic Interventions: 1 to 1 sessions, Unit Group sessions and Medication administration.  Evaluation of Outcomes: Not Met   RN Treatment Plan for Primary Diagnosis: Schizophrenia ([place]) Long Term Goal(s): Knowledge of disease and therapeutic regimen to maintain health will improve  Short Term Goals: Ability to remain free from injury will improve, Ability to verbalize frustration and anger appropriately will improve, Ability to demonstrate self-control, Ability to participate in decision making will improve, Ability to identify and develop effective coping behaviors will improve, and Compliance with prescribed medications will improve  Medication Management: RN will administer medications as  ordered by provider, will assess and evaluate patient's response and provide education to patient for prescribed medication. RN will report any adverse and/or side effects to prescribing provider.  Therapeutic Interventions: 1 on 1 counseling sessions, Psychoeducation, Medication administration, Evaluate responses to treatment, Monitor vital signs and CBGs as ordered, Perform/monitor CIWA, COWS, AIMS and Fall Risk screenings as ordered, Perform wound care treatments as ordered.  Evaluation of Outcomes: Not Met   LCSW Treatment Plan for Primary Diagnosis: Schizophrenia ([place]) Long Term Goal(s): Safe transition to appropriate next level of care at discharge, Engage patient in therapeutic group addressing interpersonal concerns.  Short Term Goals: Engage patient in aftercare planning with referrals and resources, Increase social support, Increase ability to appropriately verbalize feelings, Increase emotional regulation, Identify triggers associated with mental health/substance abuse issues, and Increase skills for wellness and recovery  Therapeutic Interventions: Assess for all discharge needs, 1 to 1 time with Social worker, Explore available resources and support systems, Assess for adequacy in community support network, Educate family and significant  other(s) on suicide prevention, Complete Psychosocial Assessment, Interpersonal group therapy.  Evaluation of Outcomes: Not Met   Progress in Treatment: Attending groups: No. Participating in groups: No. Taking medication as prescribed: Yes. Toleration medication: Yes. Family/Significant other contact made: No, will contact:  mother Patient understands diagnosis: No. Discussing patient identified problems/goals with staff: Yes. Medical problems stabilized or resolved: Yes. Denies suicidal/homicidal ideation: Yes. Issues/concerns per patient self-inventory: No.   New problem(s) identified: No, Describe:  none  New Short Term/Long Term  Goal(s): medication stabilization, elimination of SI thoughts, development of comprehensive mental wellness plan.    Patient Goals:  Did not attend  Discharge Plan or Barriers: Patient recently admitted. CSW will continue to follow and assess for appropriate referrals and possible discharge planning.    Reason for Continuation of Hospitalization: Delusions  Hallucinations Medication stabilization  Estimated Length of Stay: 3-5 days   Scribe for Treatment Team: Vassie Moselle, LCSW 04/16/2021 11:38 AM

## 2021-04-16 NOTE — Progress Notes (Signed)
Adult Psychoeducational Group Note ? ?Date:  04/16/2021 ?Time:  8:39 PM ? ?Group Topic/Focus:  ?Wrap-Up Group:   The focus of this group is to help patients review their daily goal of treatment and discuss progress on daily workbooks. ? ?Participation Level:  Did Not Attend ? ?Participation Quality:   Did Not Attend ? ?Affect:   Did Not Attend ? ?Cognitive:   Did Not Attend ? ?Insight: None ? ?Engagement in Group:   Did Not Attend ? ?Modes of Intervention:   Did Not Attend ? ?Additional Comments:  Pt was encouraged to attend wrap up group but did not attend. ? ?Felipa Furnace ?04/16/2021, 8:39 PM ?

## 2021-04-16 NOTE — Group Note (Signed)
LCSW Group Therapy Note ? ? ?Group Date: 04/16/2021 ?Start Time: 1300 ?End Time: 1400 ? ?Type of Therapy and Topic:  Group Therapy: Urge Surfing: Triggers ? ?Participation Level:  Did not attend ? ?Description of Group: Recognizing Triggers: Patients defined triggers and discussed the importance of recognizing their personal warning signs. Patients identified their own triggers and how they tend to cope with stressful situations. Patients discussed areas such as people, places, things, and thoughts that trigger certain emotions for them. CSW provided support to patients and discussed safety planning for when these triggers occur. Group participants had opportunities to share openly with the group and participate in a group discussion while providing support and feedback to their peers.  Patients were provided with a technique: urge surfing, to help deal with triggers and create better outcomes for the patient. ? ? ? ?Therapeutic Goals: ? ?1.  Patients will identify a trigger that they are struggling with ?2. Patient will identify past negative outcomes to reacting to a trigger. ?3. Patient will identify emotions and feelings associated with the trigger ?4.  Patients will be introduced to Urge Surfing technique to assist with getting through triggers.  ? ? ?Summary of Patient Progress:   ? ?Did not attend ? ? ?Vonnie Spagnolo E Rylie Knierim, LCSW ?04/16/2021  2:10 PM   ? ?

## 2021-04-16 NOTE — Group Note (Signed)
Recreation Therapy Group Note ? ? ?Group Topic:Coping Skills  ?Group Date: 04/16/2021 ?Start Time: 1000 ?End Time: 1035 ?Facilitators: Caroll Rancher, LRT,CTRS ?Location: [address] Day[address] ? ? ?Goal Area(s) Addresses:  ?Patient will identify positive coping skills. ?Patient will identify benefits of using coping skills post d/c. ? ?Group Description:  Mind Map.  Patient was provided a blank template of a diagram with 32 blank boxes in a tiered system, branching from the center (similar to a bubble chart). LRT directed patients to label the middle of the diagram "Coping Skills" and consider 8 different challenges that would require coping skills.  Patients and LRT filled in the 2nd tier of boxes with the challenges that were identified (anxiety, depression, anger, stress, happy, suicidal thoughts, gestures and finances).  Patients were to then come up with 3 coping skills that could be used for each.  When patients finished, LRT would write the coping skills on the board and patients could fill in any blank spots they may have had on their sheets.   ? ? ?Affect/Mood: N/A ?  ?Participation Level: Did not attend ?  ? ?Clinical Observations/Individualized Feedback:   ? ? ?Plan: Continue to engage patient in RT group sessions 2-3x/week. ? ? ?Caroll Rancher, LRT, CTRS ?04/16/2021 1:12 PM ?
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
## 2021-04-16 NOTE — Progress Notes (Signed)
Pt denies SI/HI/AVH and verbally agrees to approach staff if these become apparent or before harming themselves/others. Rates depression 0/10. Rates anxiety 0/10. Rates pain 0/10.  Pt has been in his room for the entire day. Pt has not been seen pacing. Pt came to the med window for meds and came out to throw his lunch in the trash. Pt came out of room around 3pm and has started pacing. Scheduled medications administered to pt, per MD orders. RN provided support and encouragement to pt. Q15 min safety checks implemented and continued. Pt safe on the unit. RN will continue to monitor and intervene as needed.  ? 04/16/21 0753  ?Psych Admission Type (Psych Patients Only)  ?Admission Status Involuntary  ?Psychosocial Assessment  ?Patient Complaints None  ?Eye Contact Brief  ?Facial Expression Flat  ?Affect Flat  ?Speech Logical/coherent  ?Interaction Isolative;Guarded;Forwards little;Minimal;No initiation;Poor  ?Motor Activity Slow  ?Appearance/Hygiene Poor hygiene;Disheveled  ?Behavior Characteristics Cooperative  ?Mood Pleasant  ?Thought Process  ?Coherency Blocking  ?Content Delusions  ?Delusions Paranoid  ?Perception Hallucinations  ?Hallucination Auditory;Visual  ?Judgment Poor  ?Confusion None  ?Danger to Self  ?Current suicidal ideation? Denies  ?Danger to Others  ?Danger to Others None reported or observed  ? ? ?

## 2021-04-16 NOTE — Progress Notes (Signed)
?   04/16/21 2010  ?Psych Admission Type (Psych Patients Only)  ?Admission Status Involuntary  ?Psychosocial Assessment  ?Patient Complaints None  ?Eye Contact Brief  ?Facial Expression Flat  ?Affect Flat  ?Speech Logical/coherent  ?Interaction Minimal;Guarded  ?Motor Activity Slow  ?Appearance/Hygiene Disheveled  ?Behavior Characteristics Cooperative;Guarded  ?Mood Preoccupied  ?Thought Process  ?Coherency Blocking  ?Content Preoccupation  ?Delusions Paranoid  ?Perception Hallucinations  ?Hallucination None reported or observed  ?Judgment Poor  ?Confusion None  ?Danger to Self  ?Current suicidal ideation? Denies  ?Danger to Others  ?Danger to Others None reported or observed  ? ?Pt seen in his room. Pt denies SI, HI, AVH and pain. Pt denies anxiety and depression. Pt is talking to himself. Pt minimal and responds with short quick responses. No other complaints noted. ?

## 2021-04-17 DIAGNOSIS — F209 Schizophrenia, unspecified: Secondary | ICD-10-CM

## 2021-04-17 NOTE — Progress Notes (Signed)
Pt spent majority of the evening in his room, pt continues to be paranoid and suspicious at times ? ? ? 04/17/21 2200  ?Psych Admission Type (Psych Patients Only)  ?Admission Status Involuntary  ?Psychosocial Assessment  ?Patient Complaints None  ?Eye Contact Fair  ?Facial Expression Flat;Sad  ?Affect Sad;Flat  ?Speech Logical/coherent  ?Interaction Minimal  ?Motor Activity Slow  ?Appearance/Hygiene Disheveled  ?Behavior Characteristics Cooperative  ?Mood Suspicious;Preoccupied  ?Aggressive Behavior  ?Effect No apparent injury  ?Thought Process  ?Coherency Blocking  ?Content Preoccupation  ?Delusions Paranoid  ?Perception Hallucinations  ?Hallucination None reported or observed  ?Judgment Poor  ?Confusion None  ?Danger to Self  ?Current suicidal ideation? Denies  ?Danger to Others  ?Danger to Others None reported or observed  ? ? ?

## 2021-04-17 NOTE — Group Note (Signed)
Recreation Therapy Group Note ? ? ?Group Topic:Self-Esteem  ?Group Date: 04/17/2021 ?Start Time: 1000 ?End Time: 1100 ?Facilitators: Caroll Rancher, LRT,CTRS ?Location: [address] Day[address] ? ? ?Goal Area(s) Addresses:  ?Patient will successfully identify positive attributes about themselves.  ?Patient will identify healthy ways to increase self-esteem. ?Patient will acknowledge benefit(s) of improved self-esteem.  ? ?Group Description:  Collage.  LRT and patients discussed the importance of positive self esteem and why it's important in day to day life.  Patients were then instructed to create collages that highlighted their positive attributes along with things that inspire them, things they have accomplished or things they hope to accomplish.  Patients were given 30 minutes the create collages with supplies available (construction paper, markers, glue sticks, safety scissors and magazines) before sharing with the group. ? ? ?Affect/Mood: N/A ?  ?Participation Level: Did not attend ?  ? ?Clinical Observations/Individualized Feedback:   ? ? ?Plan: Continue to engage patient in RT group sessions 2-3x/week. ? ? ?Caroll Rancher, LRT,CTRS ?04/17/2021 12:14 PM ?

## 2021-04-17 NOTE — Progress Notes (Signed)
?   04/17/21 1013  ?Psych Admission Type (Psych Patients Only)  ?Admission Status Involuntary  ?Psychosocial Assessment  ?Patient Complaints None  ?Eye Contact Brief  ?Facial Expression Flat  ?Affect Flat  ?Speech Logical/coherent  ?Interaction Minimal  ?Motor Activity Slow  ?Appearance/Hygiene Unremarkable  ?Behavior Characteristics Cooperative  ?Mood Preoccupied  ?Thought Process  ?Coherency Blocking  ?Content Preoccupation  ?Delusions Paranoid  ?Perception Hallucinations  ?Hallucination None reported or observed  ?Judgment Poor  ?Confusion None  ?Danger to Self  ?Current suicidal ideation? Denies  ?Danger to Others  ?Danger to Others None reported or observed  ? ? ?

## 2021-04-17 NOTE — Progress Notes (Signed)
The focus of this group is to help patients review their daily goal of treatment and discuss progress on daily workbooks. Pt did not attend the evening group. 
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
## 2021-04-17 NOTE — Plan of Care (Signed)
?  Problem: Coping: ?Goal: Ability to verbalize frustrations and anger appropriately will improve ?Outcome: Progressing ?  ?Problem: Coping: ?Goal: Ability to demonstrate self-control will improve ?Outcome: Progressing ?  ?Problem: Health Behavior/Discharge Planning: ?Goal: Compliance with treatment plan for underlying cause of condition will improve ?Outcome: Progressing ?  ?Problem: Safety: ?Goal: Ability to redirect hostility and anger into socially appropriate behaviors will improve ?Outcome: Progressing ?  ?Problem: Safety: ?Goal: Ability to remain free from injury will improve ?Outcome: Progressing ?  ?

## 2021-04-18 MED ORDER — ARIPIPRAZOLE 10 MG PO TABS
10.0000 mg | ORAL_TABLET | Freq: Every day | ORAL | Status: DC
  Administered 2021-04-18 – 2021-04-20 (×3): 10 mg via ORAL
  Filled 2021-04-18 (×5): qty 1

## 2021-04-18 MED ORDER — ARIPIPRAZOLE ER 400 MG IM SRER
400.0000 mg | Freq: Once | INTRAMUSCULAR | Status: AC
  Administered 2021-04-18: 400 mg via INTRAMUSCULAR

## 2021-04-18 NOTE — Progress Notes (Signed)
Pt continues to be paranoid and suspicious on the unit this evening ? ? ? 04/18/21 2100  ?Psych Admission Type (Psych Patients Only)  ?Admission Status Involuntary  ?Psychosocial Assessment  ?Patient Complaints None  ?Eye Contact Fair  ?Facial Expression Flat;Sad  ?Affect Sad;Flat  ?Speech Logical/coherent  ?Interaction Minimal  ?Motor Activity Slow  ?Appearance/Hygiene Disheveled  ?Behavior Characteristics Cooperative  ?Mood Suspicious;Preoccupied  ?Aggressive Behavior  ?Effect No apparent injury  ?Thought Process  ?Coherency Blocking  ?Content Preoccupation  ?Delusions Paranoid  ?Perception Hallucinations  ?Hallucination None reported or observed  ?Judgment Poor  ?Confusion None  ?Danger to Self  ?Current suicidal ideation? Denies  ?Danger to Others  ?Danger to Others None reported or observed  ? ? ?

## 2021-04-18 NOTE — Group Note (Signed)
Recreation Therapy Group Note ? ? ?Group Topic:Healthy Decision Making  ?Group Date: 04/18/2021 ?Start Time: 1002 ?End Time: 1040 ?Facilitators: Caroll Rancher, LRT,CTRS ?Location: [address] Dayroom ? ? ?Goal Area(s) Addresses:  ?Patient will effectively work with peer towards shared goal.  ?Patient will identify factors that guided their decision making.  ?Patient will pro-socially communicate ideas during group session.  ? ?Group Description: Patients were given a scenario that they were going to be stranded on a deserted [place] for several months before being rescued. Writer tasked them with making a list of 15 things they would choose to bring with them for "survival". The list of items was prioritized most important to least. Each patient would come up with their own list, then work together to create a new list of 15 items while in a group of 3-5 peers. LRT discussed each person's list and how it differed from others. The debrief included discussion of priorities, good decisions versus bad decisions, and how it is important to think before acting so we can make the best decision possible. LRT tied the concept of effective communication among group members to patient's support systems outside of the hospital and its benefit post discharge. ? ? ?Affect/Mood: N/A ?  ?Participation Level: Did not attend ?  ? ?Clinical Observations/Individualized Feedback:   ? ? ?Plan: Continue to engage patient in RT group sessions 2-3x/week. ? ? ?Caroll Rancher, LRT,CTRS ?04/18/2021 1:12 PM ?

## 2021-04-18 NOTE — Progress Notes (Signed)
The focus of this group is to help patients review their daily goal of treatment and discuss progress on daily workbooks. Pt did not attend the evening group. 

## 2021-04-18 NOTE — Progress Notes (Addendum)
Pt visible in milieu, pacing in hall while talking to self at intervals. Remains preoccupied, suspicious with blunted affect and is minimal /superficial on interactions. Compliant with Abilify Maintena with increased verbal education and encouragement from staff due to him being paranoid that staff was trying to "give me medicine to hurt me when that doctor did not tell me anything about no shot".  Showered this shift, bed linens changed and laundry done by staff. Pt remains complaint with all PO medications when offered and is cooperative with mouth checks. Emotional support and encouragement provided to pt. Safety checks continues at Q 15 minutes intervals without self harm gestures or outburst to note thus far. Verbal education provided on all medications and effects monitored. Encouraged pt to voice concerns. Pt remains safe on unit.  ?

## 2021-04-18 NOTE — Group Note (Signed)
LCSW Group Therapy Note ? ? ?Group Date: 04/18/2021 ?Start Time: 1300 ?End Time: 1400 ? ? ?Type of Therapy and Topic:  Group Therapy: Challenging Core Beliefs ? ?Participation Level:  Did Not Attend ? ?Description of Group:  ?Patients were educated about core beliefs and asked to identify one harmful core belief that they have. Patients were asked to explore from where those beliefs originate. Patients were asked to discuss how those beliefs make them feel and the resulting behaviors of those beliefs. They were then be asked if those beliefs are true and, if so, what evidence they have to support them. Lastly, group members were challenged to replace those negative core beliefs with helpful beliefs.  ? ?Therapeutic Goals:  ? ?1. Patient will identify harmful core beliefs and explore the origins of such beliefs. ?2. Patient will identify feelings and behaviors that result from those core beliefs. ?3. Patient will discuss whether such beliefs are true. ?4.  Patient will replace harmful core beliefs with helpful ones. ? ?Summary of Patient Progress:  Patient was invited to attend group however, chose not to attend.  ? ?Therapeutic Modalities: Cognitive Behavioral Therapy; Solution-Focused Therapy ? ? ?Otelia Santee, LCSW ?04/18/2021  2:13 PM   ?
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
## 2021-04-18 NOTE — Progress Notes (Signed)
Pt had an episode of vomiting earlier, pt said he did not want anything for it, pt given a little ginger ale , will continue to monitor ?

## 2021-04-19 LAB — BASIC METABOLIC PANEL
Anion gap: 10 (ref 5–15)
BUN: 11 mg/dL (ref 6–20)
CO2: 22 mmol/L (ref 22–32)
Calcium: 9.7 mg/dL (ref 8.9–10.3)
Chloride: 105 mmol/L (ref 98–111)
Creatinine, Ser: 0.86 mg/dL (ref 0.61–1.24)
GFR, Estimated: >60 mL/min (ref >60)
Glucose, Bld: 109 mg/dL — ABNORMAL HIGH (ref 70–99)
Potassium: 3.9 mmol/L (ref 3.5–5.1)
Sodium: 137 mmol/L (ref 135–145)

## 2021-04-19 LAB — LITHIUM LEVEL: Lithium Lvl: 0.54 mmol/L — ABNORMAL LOW (ref 0.60–1.20)

## 2021-04-19 MED ORDER — HALOPERIDOL LACTATE 5 MG/ML IJ SOLN: 5.0000 mg | Freq: Four times a day (QID) | INTRAMUSCULAR | Status: DC | PRN

## 2021-04-19 MED ORDER — LORAZEPAM 2 MG/ML IJ SOLN: 1.0000 mg | Freq: Four times a day (QID) | INTRAMUSCULAR | Status: DC | PRN

## 2021-04-19 MED ORDER — BENZTROPINE MESYLATE 1 MG/ML IJ SOLN: 1.0000 mg | Freq: Four times a day (QID) | INTRAMUSCULAR | Status: DC | PRN

## 2021-04-19 MED ORDER — LORAZEPAM 1 MG PO TABS: 1.0000 mg | ORAL_TABLET | Freq: Four times a day (QID) | ORAL | Status: DC | PRN

## 2021-04-19 MED ORDER — HALOPERIDOL 5 MG PO TABS: 5.0000 mg | ORAL_TABLET | Freq: Four times a day (QID) | ORAL | Status: DC | PRN

## 2021-04-19 MED ORDER — BENZTROPINE MESYLATE 1 MG PO TABS: 1.0000 mg | ORAL_TABLET | Freq: Four times a day (QID) | ORAL | Status: DC | PRN

## 2021-04-19 NOTE — Progress Notes (Signed)
?   04/19/21 0500  ?Sleep  ?Number of Hours 7.5 ?(from 1900)  ? ? ?
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
## 2021-04-19 NOTE — Group Note (Signed)
Date:  04/19/2021 ?Time:  9:49 AM ? ?Group Topic/Focus:  ?Goals Group:   The focus of this group is to help patients establish daily goals to achieve during treatment and discuss how the patient can incorporate goal setting into their daily lives to aide in recovery. ?Self Care:   The focus of this group is to help patients understand the importance of self-care in order to improve or restore emotional, physical, spiritual, interpersonal, and financial health. ? ? ? ?Participation Level:  Did Not Attend ? ?James [name] James [name] ?04/19/2021, 9:49 AM ? ?

## 2021-04-19 NOTE — Group Note (Signed)
Recreation Therapy Group Note ? ? ?Group Topic:Team Building  ?Group Date: 04/19/2021 ?Start Time: 1000 ?End Time: 1025 ?Facilitators: Caroll Rancher, LRT,CTRS ?Location: [address] Day[address] ? ? ?Goal Area(s) Addresses:  ?Patient will effectively work with peer towards shared goal.  ?Patient will identify skills used to make activity successful.  ?Patient will identify how skills used during activity can be applied to reach post d/c goals.  ?  ?Group Description: Tallest [employer]. In teams of 5-6, patients were given 25 small craft pipe cleaners. Using the materials provided, patients were instructed to compete against the opposing team(s) to build the tallest free-standing structure from floor level. The activity was timed; difficulty increased by Clinical research associate as Production designer, theatre/television/film continued.  Systematically resources were removed with additional directions for example, placing one arm behind their back, working in silence, and shape stipulations. LRT facilitated post-activity discussion reviewing team processes and necessary communication skills involved in completion. Patients were encouraged to reflect how the skills utilized, or not utilized, in this activity can be incorporated to positively impact support systems post discharge. ? ? ?Affect/Mood: N/A ?  ?Participation Level: Did not attend ?  ? ?Clinical Observations/Individualized Feedback:   ?  ? ?Plan: Continue to engage patient in RT group sessions 2-3x/week. ? ? ?Caroll Rancher, LRT,CTRS ?04/19/2021 12:04 PM ?

## 2021-04-19 NOTE — Progress Notes (Signed)
Pt refused HS medication, pt stated he was not feeling good, pt in no distress at this time, will continue to monitor ? ? ? 04/19/21 2100  ?Psych Admission Type (Psych Patients Only)  ?Admission Status Involuntary  ?Psychosocial Assessment  ?Patient Complaints Anxiety  ?Eye Contact Fair  ?Facial Expression Flat  ?Affect Appropriate to circumstance  ?Speech Logical/coherent  ?Interaction Minimal;Avoidant  ?Motor Activity Slow  ?Appearance/Hygiene Disheveled  ?Behavior Characteristics Cooperative  ?Mood Preoccupied  ?Aggressive Behavior  ?Effect No apparent injury  ?Thought Process  ?Coherency Concrete thinking;Circumstantial  ?Content Preoccupation;Paranoia  ?Delusions Paranoid  ?Perception Hallucinations  ?Hallucination Auditory  ?Judgment Impaired  ?Confusion None  ?Danger to Self  ?Current suicidal ideation? Denies  ?Danger to Others  ?Danger to Others None reported or observed  ? ? ?

## 2021-04-20 ENCOUNTER — Encounter (HOSPITAL_COMMUNITY): Payer: Self-pay

## 2021-04-20 MED ORDER — ARIPIPRAZOLE 15 MG PO TABS
15.0000 mg | ORAL_TABLET | Freq: Every day | ORAL | Status: DC
  Administered 2021-04-21 – 2021-04-25 (×5): 15 mg via ORAL
  Filled 2021-04-20 (×7): qty 1

## 2021-04-20 NOTE — Group Note (Signed)
[facility] LCSW Group Therapy Note ? ?Date/Time:  04/20/2021 11:00 am ? ?Type of Therapy and Topic:  Group Therapy:  Healthy and Unhealthy Supports ? ?Participation Level:  Did Not Attend  ? ?Description of Group:  Patients in this group were introduced to the idea of adding a variety of healthy supports to address the various needs in their lives, especially in reference to their plans and focus for the new year.  Patients discussed what additional healthy supports could be helpful in their recovery and wellness after discharge in order to prevent future hospitalizations.   An emphasis was placed on using counselor, doctor, therapy groups, 12-step groups, and problem-specific support groups to expand supports.   ? ?Therapeutic Goals: ? ? 1)  discuss importance of adding supports to stay well once out of the hospital ? 2)  compare healthy versus unhealthy supports and identify some examples of each ? 3)  generate ideas and descriptions of healthy supports that can be added ? 4)  offer mutual support about how to address unhealthy supports ? 5)  encourage active participation in and adherence to discharge plan ?  ? ?Summary of Patient Progress:  Patient was given worksheet and offered opportunity to discuss one on one with CSW.  ? ? ?Therapeutic Modalities:   ?Motivational Interviewing ?Brief Solution-Focused Therapy ? ? ?Lithzy Bernard MSW, LCSW ?Clincal Social Worker  ?[facility]  ?
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
## 2021-04-20 NOTE — Progress Notes (Signed)
Pt got up and decided to take HS medications and they were given per [facility] . Pt stated he believed the Metformin was making him sick and he did not want to take it anymore  ?

## 2021-04-20 NOTE — Plan of Care (Signed)
?  Problem: Education: ?Goal: Mental status will improve ?04/20/2021 0856 by Melvenia Needles, RN ?Outcome: Progressing ?04/20/2021 0856 by Melvenia Needles, RN ?Outcome: Progressing ?  ?Problem: Coping: ?Goal: Ability to verbalize frustrations and anger appropriately will improve ?Outcome: Progressing ?Goal: Ability to demonstrate self-control will improve ?Outcome: Progressing ?  ?

## 2021-04-20 NOTE — Group Note (Signed)
Recreation Therapy Group Note ? ? ?Group Topic:Health and Wellness  ?Group Date: 04/20/2021 ?Start Time: 1000 ?End Time: 1020 ?Facilitators: Caroll Rancher, LRT,CTRS ?Location: [address] Day[address] ? ? ?Goal Area(s) Addresses:  ?Patient will verbalize benefit of exercise during group session. ?Patient will acknowledge benefits of exercise when used as a coping mechanism.  ?  ?Group Description:  Exercise.  LRT led patients in a series of stretches to loosen up the muscles before going into the actual exercises.  Each patient would take turns leading the group in exercises of their choosing.  The goal of the group is to complete at least 30 minutes of exercise.  Patients were encouraged to get water and take breaks as needed. ? ? ?Affect/Mood: N/A ?  ?Participation Level: Did not attend ?  ? ?Clinical Observations/Individualized Feedback:   ? ? ?Plan: Continue to engage patient in RT group sessions 2-3x/week. ? ? ?Caroll Rancher, LRT,CTRS ?04/20/2021 11:35 AM ?

## 2021-04-20 NOTE — B[facility] Group Notes (Signed)
Spirituality group facilitated by Chaplain Katy Clemie General, BCC.   Group Description: Group focused on topic of hope. Patients participated in facilitated discussion around topic, connecting with one another around experiences and definitions for hope. Group members engaged with visual explorer photos, reflecting on what hope looks like for them today. Group engaged in discussion around how their definitions of hope are present today in hospital.   Modalities: Psycho-social ed, Adlerian, Narrative, MI   Patient Progress: Did not attend.  

## 2021-04-20 NOTE — B[facility] Group Notes (Signed)
Adult Psychoeducational Group Note ? ?Date:  04/20/2021 ?Time:  9:22 AM ? ?Group Topic/Focus:  ?Goals Group:   The focus of this group is to help patients establish daily goals to achieve during treatment and discuss how the patient can incorporate goal setting into their daily lives to aide in recovery. ? ?Participation Level:  Did Not Attend ? ?James [name] ?04/20/2021, 9:22 AM ?

## 2021-04-20 NOTE — Progress Notes (Signed)
[facility] MD Progress Note ? ?04/21/2021 7:20 AM ?James [name]  ?MRN:  [id number] ? ?Chief Complaint: aggression and psychiatric decompensation in context of medication noncompliance ? ?Reason for Admission:  ?James [name] is a 41 y.o. male with a history of schizophrenia, who was initially admitted for inpatient psychiatric hospitalization on 04/14/2021 for management of aggressive behaviors at home and psychiatric decompensation in context of medication noncompliance. The patient is currently on Hospital Day 7.  ? ?Chart Review from last 24 hours:  ?The patient's chart was reviewed and nursing notes were reviewed. The patient's case was discussed in multidisciplinary team meeting.  Per nursing patient was pacing on the unit but easily redirectable.  He had no acute behavioral issues or safety concerns noted.  He did not attend group.  Per [facility] patient was compliant with scheduled medicines but did not receive his bedtime dose of lithium and refused his metformin.  He also refused his nighttime dose of Zyprexa.  He did not require any PRN medications. ? ?Information Obtained Today During Patient Interview: ?The patient was seen and evaluated on the unit. On assessment today the patient reports that he did not refuse his lithium or nighttime Zyprexa and instead thinks he was asleep and therefore did not get the medications.  He admits he has been refusing his metformin due to perceived side effects including nausea with medication.  He denies any AVH, paranoia, ideas of reference or first rank symptoms; however, he appears guarded on exam with residual poverty of thought.  He denies medication side effects and currently voices no physical complaints.  He states that his sleep and appetite are "fine".  He denies SI or HI.  He was encouraged to shower and attend to his ADLs as well as to try to go to group. ? ?Principal Problem: Schizophrenia (HCC) ?Diagnosis: Principal Problem: ?  Schizophrenia (HCC) ? ?Total Time  Spent in Direct Patient Care:  ?I personally spent 25 minutes on the unit in direct patient care. The direct patient care time included face-to-face time with the patient, reviewing the patient's chart, communicating with other professionals, and coordinating care. Greater than 50% of this time was spent in counseling or coordinating care with the patient regarding goals of hospitalization, psycho-education, and discharge planning needs. ? ?Past Psychiatric History: see H&P ? ?Past Medical History:  ?Past Medical History:  ?Diagnosis Date  ? Gastroesophageal reflux disease 11/25/2017  ? ?Family History:  ?Family History  ?Problem Relation Age of Onset  ? Diabetes Mother   ? Diabetes Father   ? Prostate cancer Father   ? ?Family Psychiatric  History: see H&P ? ?Social History:  ?Social History  ? ?Substance and Sexual Activity  ?Alcohol Use No  ? Alcohol/week: 0.0 standard drinks  ? Comment: rare alcohol  ?   ?Social History  ? ?Substance and Sexual Activity  ?Drug Use No  ?  ?Social History  ? ?Socioeconomic History  ? Marital status: Single  ?  Spouse name: Not on file  ? Number of children: Not on file  ? Years of education: Not on file  ? Highest education level: Not on file  ?Occupational History  ? Not on file  ?Tobacco Use  ? Smoking status: Every Day  ?  Packs/day: 0.50  ?  Types: Cigarettes  ? Smokeless tobacco: Never  ?Vaping Use  ? Vaping Use: Never used  ?Substance and Sexual Activity  ? Alcohol use: No  ?  Alcohol/week: 0.0 standard drinks  ?  Comment: rare alcohol  ? Drug use: No  ? Sexual activity: Yes  ?  Comment: male  ?Other Topics Concern  ? Not on file  ?Social History Narrative  ? Not on file  ? ?Social Determinants of Health  ? ?Financial Resource Strain: Not on file  ?Food Insecurity: Not on file  ?Transportation Needs: Not on file  ?Physical Activity: Not on file  ?Stress: Not on file  ?Social Connections: Not on file  ? ?Sleep: Good ? ?Appetite:  Good ? ?Current Medications: ?Current  Facility-Administered Medications  ?Medication Dose Route Frequency Provider Last Rate Last Admin  ? acetaminophen (TYLENOL) tablet 650 mg  650 mg Oral Q6H PRN Lenard Lance, FNP      ? alum & mag hydroxide-simeth (MAALOX/MYLANTA) 200-200-20 MG/5ML suspension 30 mL  30 mL Oral Q4H PRN Lenard Lance, FNP      ? ARIPiprazole (ABILIFY) tablet 15 mg  15 mg Oral Daily Pashayan, Mardelle Matte, MD      ? benztropine (COGENTIN) tablet 0.5 mg  0.5 mg Oral BID PRN Lenard Lance, FNP      ? haloperidol (HALDOL) tablet 5 mg  5 mg Oral Q6H PRN Lauro Franklin, MD      ? And  ? LORazepam (ATIVAN) tablet 1 mg  1 mg Oral Q6H PRN Lauro Franklin, MD      ? And  ? benztropine (COGENTIN) tablet 1 mg  1 mg Oral Q6H PRN Lauro Franklin, MD      ? haloperidol lactate (HALDOL) injection 5 mg  5 mg Intramuscular Q6H PRN Lauro Franklin, MD      ? And  ? LORazepam (ATIVAN) injection 1 mg  1 mg Intravenous Q6H PRN Lauro Franklin, MD      ? And  ? benztropine mesylate (COGENTIN) injection 1 mg  1 mg Intramuscular Q6H PRN Lauro Franklin, MD      ? feeding supplement (ENSURE ENLIVE / ENSURE PLUS) liquid 237 mL  237 mL Oral BID BM Hill, Shelbie Hutching, MD   237 mL at 04/20/21 1521  ? lithium carbonate (ESKALITH) CR tablet 450 mg  450 mg Oral Q12H Hill, Shelbie Hutching, MD   450 mg at 04/20/21 0756  ? magnesium hydroxide (MILK OF MAGNESIA) suspension 30 mL  30 mL Oral Daily PRN Lenard Lance, FNP      ? metFORMIN (GLUCOPHAGE) tablet 500 mg  500 mg Oral BID WC Lenard Lance, FNP   500 mg at 04/19/21 1716  ? OLANZapine zydis (ZYPREXA) disintegrating tablet 10 mg  10 mg Oral q AM Hill, Shelbie Hutching, MD   10 mg at 04/21/21 0610  ? OLANZapine zydis (ZYPREXA) disintegrating tablet 20 mg  20 mg Oral QHS Hill, Shelbie Hutching, MD   20 mg at 04/20/21 4765  ? ? ?Lab Results:  ?No results found for this or any previous visit (from the past 48 hour(s)). ? ? ?Blood Alcohol level:  ?Lab Results  ?Component Value Date  ?  ETH <10 04/13/2021  ? ETH <10 07/16/2020  ? ? ?Metabolic Disorder Labs: ?Lab Results  ?Component Value Date  ? HGBA1C 5.9 03/30/2021  ? MPG 120 11/23/2019  ? MPG 111.15 06/27/2018  ? ?Lab Results  ?Component Value Date  ? PROLACTIN 6.9 04/13/2021  ? PROLACTIN 3.3 (L) 11/23/2019  ? ?Lab Results  ?Component Value Date  ? CHOL 174 04/13/2021  ? TRIG 67 04/13/2021  ? HDL 47 04/13/2021  ? CHOLHDL  3.7 04/13/2021  ? VLDL 13 04/13/2021  ? LDLCALC 114 (H) 04/13/2021  ? LDLCALC 74 07/22/2020  ? ? ?Physical Findings: ?AIMS: Facial and Oral Movements ?Muscles of Facial Expression: None, normal ?Lips and Perioral Area: None, normal ?Jaw: None, normal ?Tongue: None, normal,Extremity Movements ?Upper (arms, wrists, hands, fingers): None, normal ?Lower (legs, knees, ankles, toes): None, normal, Trunk Movements ?Neck, shoulders, hips: None, normal, Overall Severity ?Severity of abnormal movements (highest score from questions above): None, normal ?Incapacitation due to abnormal movements: None, normal ?Patient's awareness of abnormal movements (rate only patient's report): No Awareness, Dental Status ?Current problems with teeth and/or dentures?: No ?Does patient usually wear dentures?: No  ? ?Musculoskeletal: ?Strength & Muscle Tone: within normal limits ?Gait & Station: normal ?Patient leans: N/A ? ?Psychiatric Specialty Exam: ? ?Presentation  ?General Appearance: Fair grooming - in hospital gown ? ?Eye Contact:Fair ? ?Speech:clear and coherent - answers in short phrases ? ?Speech Volume:Normal ? ?Mood and Affect  ?Mood:-- (described as "okay" - aloof) ? ?Affect:- constricted, ambivalent ? ?Thought Process  ?Thought Processes:Concrete, ruminative about discharge planning ? ?Orientation:to self, month, year, and city ? ?Thought Content: Appears to have poverty of thought but denies SI, HI, AVH, paranoia, ideas of reference or first rank symptoms and is not grossly responding to internal/external stimuli during exam but remains  guarded ? ?History of Schizophrenia/Schizoaffective disorder:Yes ? ?Duration of Psychotic Symptoms:Greater than six months ? ?Hallucinations: Denied ? ?Ideas of Reference:None ? ?Suicidal Thoughts:Suicidal Though

## 2021-04-20 NOTE — Progress Notes (Signed)
?   04/20/21 0800  ?Psych Admission Type (Psych Patients Only)  ?Admission Status Involuntary  ?Psychosocial Assessment  ?Patient Complaints Anxiety;Restlessness  ?Eye Contact Fair  ?Facial Expression Flat  ?Affect Appropriate to circumstance  ?Speech Logical/coherent  ?Interaction Minimal  ?Motor Activity Pacing  ?Appearance/Hygiene Disheveled  ?Behavior Characteristics Cooperative  ?Mood Anxious  ?Aggressive Behavior  ?Effect No apparent injury  ?Thought Process  ?Coherency Concrete thinking;Circumstantial  ?Content Preoccupation  ?Delusions Paranoid  ?Perception WDL  ?Hallucination None reported or observed  ?Judgment Impaired  ?Confusion Severe  ?Danger to Self  ?Current suicidal ideation? Denies  ?Danger to Others  ?Danger to Others None reported or observed  ? ?Patient is seen pacing on the unit, but he is easily redirectable. Patient is alert and oriented to place, person and situation. Patient is compliant with scheduled medication,well  tolerated by him with no side effect noted. Patient denies SI/HI/AVH. Patient affect is appropriate with situation. No distress noted at this time, will continue to monitor. ?

## 2021-04-20 NOTE — Progress Notes (Signed)
?   04/20/21 [id number]  ?Sleep  ?Number of Hours 8.5  ? ? ?

## 2021-04-20 NOTE — BH IP Treatment Plan (Signed)
Interdisciplinary Treatment and Diagnostic Plan Update ? ?04/20/2021 ?Time of Session: 9:50am  ?James [name] ?MRN: [id number] ? ?Principal Diagnosis: Schizophrenia (HCC) ? ?Secondary Diagnoses: Principal Problem: ?  Schizophrenia (HCC) ? ? ?Current Medications:  ?Current Facility-Administered Medications  ?Medication Dose Route Frequency Provider Last Rate Last Admin  ? acetaminophen (TYLENOL) tablet 650 mg  650 mg Oral Q6H PRN Lenard LanceAllen, Tina L, FNP      ? alum & mag hydroxide-simeth (MAALOX/MYLANTA) 200-200-20 MG/5ML suspension 30 mL  30 mL Oral Q4H PRN Lenard LanceAllen, Tina L, FNP      ? ARIPiprazole (ABILIFY) tablet 10 mg  10 mg Oral Daily Lauro FranklinPashayan, Alexander S, MD   10 mg at 04/20/21 0756  ? benztropine (COGENTIN) tablet 0.5 mg  0.5 mg Oral BID PRN Lenard LanceAllen, Tina L, FNP      ? haloperidol (HALDOL) tablet 5 mg  5 mg Oral Q6H PRN Lauro FranklinPashayan, Alexander S, MD      ? And  ? LORazepam (ATIVAN) tablet 1 mg  1 mg Oral Q6H PRN Lauro FranklinPashayan, Alexander S, MD      ? And  ? benztropine (COGENTIN) tablet 1 mg  1 mg Oral Q6H PRN Lauro FranklinPashayan, Alexander S, MD      ? haloperidol lactate (HALDOL) injection 5 mg  5 mg Intramuscular Q6H PRN Lauro FranklinPashayan, Alexander S, MD      ? And  ? LORazepam (ATIVAN) injection 1 mg  1 mg Intravenous Q6H PRN Lauro FranklinPashayan, Alexander S, MD      ? And  ? benztropine mesylate (COGENTIN) injection 1 mg  1 mg Intramuscular Q6H PRN Lauro FranklinPashayan, Alexander S, MD      ? feeding supplement (ENSURE ENLIVE / ENSURE PLUS) liquid 237 mL  237 mL Oral BID BM Hill, Shelbie HutchingStephanie Leigh, MD   237 mL at 04/20/21 1000  ? lithium carbonate (ESKALITH) CR tablet 450 mg  450 mg Oral Q12H Hill, Shelbie HutchingStephanie Leigh, MD   450 mg at 04/20/21 0756  ? magnesium hydroxide (MILK OF MAGNESIA) suspension 30 mL  30 mL Oral Daily PRN Lenard LanceAllen, Tina L, FNP      ? metFORMIN (GLUCOPHAGE) tablet 500 mg  500 mg Oral BID WC Lenard LanceAllen, Tina L, FNP   500 mg at 04/19/21 1716  ? OLANZapine zydis (ZYPREXA) disintegrating tablet 10 mg  10 mg Oral q AM Hill, Shelbie HutchingStephanie Leigh, MD   10 mg at  04/20/21 0757  ? OLANZapine zydis (ZYPREXA) disintegrating tablet 20 mg  20 mg Oral QHS Hill, Shelbie HutchingStephanie Leigh, MD   20 mg at 04/20/21 [id number]  ? ?PTA Medications: ?Medications Prior to Admission  ?Medication Sig Dispense Refill Last Dose  ? benztropine (COGENTIN) 0.5 MG tablet Take 1 tablet (0.5 mg total) by mouth 2 (two) times daily as needed for tremors (EPS). 60 tablet 0   ? cholecalciferol (VITAMIN D3) 25 MCG (1000 UNIT) tablet Take 2,000 Units by mouth daily.     ? doxepin (SINEQUAN) 25 MG capsule Take 1 capsule (25 mg total) by mouth at bedtime as needed (insomnia). 30 capsule 0   ? lithium carbonate (ESKALITH) 450 MG CR tablet Take 1 tablet (450 mg total) by mouth 2 (two) times daily. For mood stabilization 60 tablet 0   ? metFORMIN (GLUCOPHAGE) 500 MG tablet TAKE 1 TABLET BY MOUTH TWICE DAILY WITH MEALS (Patient taking differently: Take 500 mg by mouth 2 (two) times daily with a meal.) 180 tablet 0   ? Multiple Vitamins-Minerals (MULTIVITAMIN WITH MINERALS) tablet Take 1 tablet by mouth daily.     ?  OLANZapine (ZYPREXA) 10 MG tablet Take 1 tablet (10 mg total) by mouth daily. For mood control 30 tablet 0   ? OLANZapine (ZYPREXA) 20 MG tablet Take 1 tablet (20 mg total) by mouth at bedtime. For mood control 30 tablet 0   ? ? ?Patient Stressors: Marital or family conflict   ?Medication change or noncompliance   ? ?Patient Strengths: Communication skills  ?Motivation for treatment/growth  ?Supportive family/friends  ? ?Treatment Modalities: Medication Management, Group therapy, Case management,  ?1 to 1 session with clinician, Psychoeducation, Recreational therapy. ? ? ?Physician Treatment Plan for Primary Diagnosis: Schizophrenia (HCC) ?Long Term Goal(s): Improvement in symptoms so as ready for discharge  ? ?Short Term Goals: Ability to identify changes in lifestyle to reduce recurrence of condition will improve ?Ability to verbalize feelings will improve ?Ability to disclose and discuss suicidal ideas ?Ability  to demonstrate self-control will improve ?Ability to identify and develop effective coping behaviors will improve ?Ability to maintain clinical measurements within normal limits will improve ?Compliance with prescribed medications will improve ? ?Medication Management: Evaluate patient's response, side effects, and tolerance of medication regimen. ? ?Therapeutic Interventions: 1 to 1 sessions, Unit Group sessions and Medication administration. ? ?Evaluation of Outcomes: Progressing ? ?Physician Treatment Plan for Secondary Diagnosis: Principal Problem: ?  Schizophrenia (HCC) ? ?Long Term Goal(s): Improvement in symptoms so as ready for discharge  ? ?Short Term Goals: Ability to identify changes in lifestyle to reduce recurrence of condition will improve ?Ability to verbalize feelings will improve ?Ability to disclose and discuss suicidal ideas ?Ability to demonstrate self-control will improve ?Ability to identify and develop effective coping behaviors will improve ?Ability to maintain clinical measurements within normal limits will improve ?Compliance with prescribed medications will improve    ? ?Medication Management: Evaluate patient's response, side effects, and tolerance of medication regimen. ? ?Therapeutic Interventions: 1 to 1 sessions, Unit Group sessions and Medication administration. ? ?Evaluation of Outcomes: Progressing ? ? ?RN Treatment Plan for Primary Diagnosis: Schizophrenia (HCC) ?Long Term Goal(s): Knowledge of disease and therapeutic regimen to maintain health will improve ? ?Short Term Goals: Ability to remain free from injury will improve, Ability to participate in decision making will improve, Ability to verbalize feelings will improve, Ability to disclose and discuss suicidal ideas, and Ability to identify and develop effective coping behaviors will improve ? ?Medication Management: RN will administer medications as ordered by provider, will assess and evaluate patient's response and provide  education to patient for prescribed medication. RN will report any adverse and/or side effects to prescribing provider. ? ?Therapeutic Interventions: 1 on 1 counseling sessions, Psychoeducation, Medication administration, Evaluate responses to treatment, Monitor vital signs and CBGs as ordered, Perform/monitor CIWA, COWS, AIMS and Fall Risk screenings as ordered, Perform wound care treatments as ordered. ? ?Evaluation of Outcomes: Progressing ? ? ?LCSW Treatment Plan for Primary Diagnosis: Schizophrenia (HCC) ?Long Term Goal(s): Safe transition to appropriate next level of care at discharge, Engage patient in therapeutic group addressing interpersonal concerns. ? ?Short Term Goals: Engage patient in aftercare planning with referrals and resources, Increase social support, Increase emotional regulation, Facilitate acceptance of mental health diagnosis and concerns, Identify triggers associated with mental health/substance abuse issues, and Increase skills for wellness and recovery ? ?Therapeutic Interventions: Assess for all discharge needs, 1 to 1 time with Child psychotherapist, Explore available resources and support systems, Assess for adequacy in community support network, Educate family and significant other(s) on suicide prevention, Complete Psychosocial Assessment, Interpersonal group therapy. ? ?Evaluation of Outcomes: Progressing ? ? ?  Progress in Treatment: ?Attending groups: No. ?Participating in groups: No. ?Taking medication as prescribed: Yes. ?Toleration medication: Yes. ?Family/Significant other contact made: No, will contact:  Mother and Legal Guardian  ?Patient understands diagnosis: No. ?Discussing patient identified problems/goals with staff: Yes. ?Medical problems stabilized or resolved: Yes. ?Denies suicidal/homicidal ideation: Yes. ?Issues/concerns per patient self-inventory: No. ?  ?  ?New problem(s) identified: No, Describe:  none ?  ?New Short Term/Long Term Goal(s): medication stabilization,  elimination of SI thoughts, development of comprehensive mental wellness plan.  ?  ?  ?Patient Goals:  Did not attend ?  ?Discharge Plan or Barriers: Patient recently admitted. CSW will continue to follow and asse

## 2021-04-21 MED ORDER — ONDANSETRON 4 MG PO TBDP
4.0000 mg | ORAL_TABLET | Freq: Once | ORAL | Status: AC | PRN
  Administered 2021-04-21: 4 mg via ORAL
  Filled 2021-04-21: qty 1

## 2021-04-21 MED ORDER — OLANZAPINE 10 MG PO TBDP
20.0000 mg | ORAL_TABLET | Freq: Every day | ORAL | Status: DC
Start: 2021-04-21 — End: 2021-04-25
  Administered 2021-04-21 – 2021-04-24 (×4): 20 mg via ORAL
  Filled 2021-04-21 (×5): qty 2

## 2021-04-21 NOTE — Progress Notes (Signed)
?   04/21/21 1300  ?Psych Admission Type (Psych Patients Only)  ?Admission Status Involuntary  ?Psychosocial Assessment  ?Patient Complaints Restlessness  ?Eye Contact Fair  ?Facial Expression Flat  ?Affect Appropriate to circumstance  ?Speech Logical/coherent  ?Interaction Minimal  ?Motor Activity Pacing  ?Appearance/Hygiene Disheveled  ?Behavior Characteristics Cooperative  ?Mood Preoccupied  ?Thought Process  ?Coherency Concrete thinking  ?Content Preoccupation  ?Delusions Paranoid  ?Perception Hallucinations  ?Hallucination Auditory  ?Judgment Impaired  ?Confusion None  ?Danger to Self  ?Current suicidal ideation? Denies  ?Danger to Others  ?Danger to Others None reported or observed  ? ? ?

## 2021-04-21 NOTE — Progress Notes (Signed)
?   04/21/21 1610  ?Sleep  ?Number of Hours 10.75  ? ? ?

## 2021-04-21 NOTE — Group Note (Signed)
[facility] Group Notes: (Clinical Social Work)  ? ?04/21/2021    ? ? ?Type of Therapy:  Group Therapy  ? ?Participation Level:  Did Not Attend - was invited both individually by MHT and by overhead announcement, chose not to attend.  Patient was observed pacing in the hallway continuously. ? ? ?Ambrose Mantle, LCSW ?04/21/2021, 11:56 AM ?   ?

## 2021-04-21 NOTE — Progress Notes (Signed)
Pt complained of feeling sick at the beginning of the shift, pt stated he felt nauseated but when pt was offered medication, pt reversed his statement saying that he was okay. Pt was then given his night time medication and went to bed. A few hours later when the staff were doing rounds, the staff found emesis every where on the floor and walls in the pt room. Pt did not report that he was throwing up. Pt had messed himself up but did not want to take a shower, it took a lot of encouragement before he could take one. Pt was also resistant to take Zofran, but after a lot explaining and encouragement, pt agreed to take it. According to the chart, pt has a history of Gastroesophageal reflux and probably could benefit being on anti reflux like Protonix. Pt also stated he does not take metformin because it make him have nausea.  Will continue to monitor.  ?

## 2021-04-22 LAB — RESP PANEL BY RT-PCR (FLU A&B, COVID) ARPGX2
Influenza A by PCR: NEGATIVE
Influenza B by PCR: NEGATIVE
SARS Coronavirus 2 by RT PCR: NEGATIVE

## 2021-04-22 MED ORDER — PANTOPRAZOLE SODIUM 40 MG PO TBEC
40.0000 mg | DELAYED_RELEASE_TABLET | Freq: Every day | ORAL | Status: DC
Start: 2021-04-22 — End: 2021-04-25
  Administered 2021-04-22 – 2021-04-25 (×4): 40 mg via ORAL
  Filled 2021-04-22 (×5): qty 1

## 2021-04-22 NOTE — Group Note (Signed)
[place] Group Notes: (Clinical Social Work)  ? ?04/22/2021    ? ? ?Type of Therapy:  Group Therapy  ? ?Participation Level:  Did Not Attend - was invited both individually by MHT and by overhead announcement, chose not to attend.  The patient came into the room during group for water, was invited to stay but just laughed and left. ? ? ?Selmer Dominion, LCSW ?04/22/2021, 12:09 PM ?   ?

## 2021-04-22 NOTE — Progress Notes (Signed)
Pt given Hat and educated on giving fecal sample and washing hands ? ? ? 04/22/21 2100  ?Psych Admission Type (Psych Patients Only)  ?Admission Status Involuntary  ?Psychosocial Assessment  ?Patient Complaints Restlessness  ?Eye Contact Fair  ?Facial Expression Flat  ?Affect Appropriate to circumstance  ?Speech Logical/coherent  ?Interaction Minimal;Cautious  ?Motor Activity Pacing  ?Appearance/Hygiene Disheveled  ?Behavior Characteristics Cooperative  ?Mood Suspicious;Preoccupied  ?Aggressive Behavior  ?Effect No apparent injury  ?Thought Process  ?Coherency Concrete thinking;Circumstantial  ?Content Preoccupation  ?Delusions Paranoid  ?Perception Hallucinations  ?Hallucination Auditory  ?Judgment Impaired  ?Confusion None  ?Danger to Self  ?Current suicidal ideation? Denies  ?Danger to Others  ?Danger to Others None reported or observed  ? ? ?

## 2021-04-22 NOTE — Plan of Care (Signed)
?  Problem: Coping: ?Goal: Ability to verbalize frustrations and anger appropriately will improve ?Outcome: Progressing ?  ?Problem: Coping: ?Goal: Ability to demonstrate self-control will improve ?Outcome: Progressing ?  ?Problem: Safety: ?Goal: Periods of time without injury will increase ?Outcome: Progressing ?  ?Problem: Coping: ?Goal: Coping ability will improve ?Outcome: Progressing ?  ?Problem: Nutritional: ?Goal: Ability to achieve adequate nutritional intake will improve ?Outcome: Progressing ?  ?

## 2021-04-22 NOTE — Progress Notes (Signed)
?   04/22/21 0915  ?Psych Admission Type (Psych Patients Only)  ?Admission Status Involuntary  ?Psychosocial Assessment  ?Patient Complaints Restlessness  ?Eye Contact Brief  ?Facial Expression Flat  ?Affect Appropriate to circumstance  ?Speech Logical/coherent  ?Interaction Minimal  ?Motor Activity Pacing  ?Appearance/Hygiene Disheveled  ?Behavior Characteristics Cooperative;Calm  ?Mood Preoccupied  ?Thought Process  ?Coherency Concrete thinking  ?Content Preoccupation  ?Delusions Paranoid  ?Perception Hallucinations  ?Hallucination Auditory  ?Judgment Impaired  ?Confusion None  ?Danger to Self  ?Current suicidal ideation? Denies  ?Danger to Others  ?Danger to Others None reported or observed  ? ? ?

## 2021-04-22 NOTE — Progress Notes (Signed)
[facility] ? ?04/22/2021 7:03 AM ?James RoEdward [name] Lohn  ?MRN:  [id number] ? ?Chief Complaint: aggression and psychiatric decompensation in context of medication noncompliance ? ?Reason for Admission:  ?James [name] Matsumura is a 41 y.o. male with a history of schizophrenia, who was initially admitted for inpatient psychiatric hospitalization on 04/14/2021 for management of aggressive behaviors at home and psychiatric decompensation in context of medication noncompliance. The patient is currently on Hospital Day 8.  ? ?Chart Review from last 24 hours:  ?The patient's chart was reviewed and nursing notes were reviewed. The patient's case was discussed in multidisciplinary team meeting.  Per nursing, patient had an episode of emesis overnight and required prompts to shower or allow his room to be cleaned. He required prompts to take PRN Zofran. He was noted to be concrete and minimal in his interactions yesterday. He did not attend group. [facility] scheduled medications except refusal of metformin and nighttime Zyprexa. He received Zofran X 1 but no other PRN meds.  ? ?Information Obtained Today During Patient Interview: ?The patient was seen and evaluated on the unit.  This morning, the patient states that he has had no further vomiting and denies nausea or diarrhea.  He denies any abdominal pain or fever and voices no other physical complaints.  He states he ate "eggs bacon and grits" for breakfast this morning and reports an improved appetite.  He denies SI, HI, AVH, paranoia, ideas of reference, first rank symptoms or paranoia.  He continues to perseverate about when he will be discharging.  He denies medication side effects.  He was encouraged to wear a mask and wash his hands frequently and monitor for additional GI symptoms.   ? ?Principal Problem: Schizophrenia (HCC) ?Diagnosis: Principal Problem: ?  Schizophrenia (HCC) ? ?Total Time Spent in Direct Patient Care:  ?I personally spent 25  minutes on the unit in direct patient care. The direct patient care time included face-to-face time with the patient, reviewing the patient's chart, communicating with other professionals, and coordinating care. Greater than 50% of this time was spent in counseling or coordinating care with the patient regarding goals of hospitalization, psycho-education, and discharge planning needs. ? ?Past Psychiatric History: see H&P ? ?Past Medical History:  ?Past Medical History:  ?Diagnosis Date  ? Gastroesophageal reflux disease 11/25/2017  ? ?Family History:  ?Family History  ?Problem Relation Age of Onset  ? Diabetes Mother   ? Diabetes Father   ? Prostate cancer Father   ? ?Family Psychiatric  History: see H&P ? ?Social History:  ?Social History  ? ?Substance and Sexual Activity  ?Alcohol Use No  ? Alcohol/week: 0.0 standard drinks  ? Comment: rare alcohol  ?   ?Social History  ? ?Substance and Sexual Activity  ?Drug Use No  ?  ?Social History  ? ?Socioeconomic History  ? Marital status: Single  ?  Spouse name: Not on file  ? Number of children: Not on file  ? Years of education: Not on file  ? Highest education level: Not on file  ?Occupational History  ? Not on file  ?Tobacco Use  ? Smoking status: Every Day  ?  Packs/day: 0.50  ?  Types: Cigarettes  ? Smokeless tobacco: Never  ?Vaping Use  ? Vaping Use: Never used  ?Substance and Sexual Activity  ? Alcohol use: No  ?  Alcohol/week: 0.0 standard drinks  ?  Comment: rare alcohol  ? Drug use: No  ? Sexual activity:  Yes  ?  Comment: male  ?Other Topics Concern  ? Not on file  ?Social History Narrative  ? Not on file  ? ?Social Determinants of Health  ? ?Financial Resource Strain: Not on file  ?Food Insecurity: Not on file  ?Transportation Needs: Not on file  ?Physical Activity: Not on file  ?Stress: Not on file  ?Social Connections: Not on file  ? ?Sleep: Good ? ?Appetite: Improving this morning after recent vomiting ? ?Current Medications: ?Current Facility-Administered  Medications  ?Medication Dose Route Frequency Provider Last Rate Last Admin  ? acetaminophen (TYLENOL) tablet 650 mg  650 mg Oral Q6H PRN Lenard Lance, FNP      ? alum & mag hydroxide-simeth (MAALOX/MYLANTA) 200-200-20 MG/5ML suspension 30 mL  30 mL Oral Q4H PRN Lenard Lance, FNP      ? ARIPiprazole (ABILIFY) tablet 15 mg  15 mg Oral Daily Lauro Franklin, MD   15 mg at 04/21/21 0809  ? benztropine (COGENTIN) tablet 0.5 mg  0.5 mg Oral BID PRN Lenard Lance, FNP      ? haloperidol (HALDOL) tablet 5 mg  5 mg Oral Q6H PRN Lauro Franklin, MD      ? And  ? LORazepam (ATIVAN) tablet 1 mg  1 mg Oral Q6H PRN Lauro Franklin, MD      ? And  ? benztropine (COGENTIN) tablet 1 mg  1 mg Oral Q6H PRN Lauro Franklin, MD      ? haloperidol lactate (HALDOL) injection 5 mg  5 mg Intramuscular Q6H PRN Lauro Franklin, MD      ? And  ? LORazepam (ATIVAN) injection 1 mg  1 mg Intravenous Q6H PRN Lauro Franklin, MD      ? And  ? benztropine mesylate (COGENTIN) injection 1 mg  1 mg Intramuscular Q6H PRN Lauro Franklin, MD      ? feeding supplement (ENSURE ENLIVE / ENSURE PLUS) liquid 237 mL  237 mL Oral BID BM Hill, Shelbie Hutching, MD   237 mL at 04/21/21 1333  ? lithium carbonate (ESKALITH) CR tablet 450 mg  450 mg Oral Q12H Hill, Shelbie Hutching, MD   450 mg at 04/21/21 2102  ? magnesium hydroxide (MILK OF MAGNESIA) suspension 30 mL  30 mL Oral Daily PRN Lenard Lance, FNP      ? metFORMIN (GLUCOPHAGE) tablet 500 mg  500 mg Oral BID WC Lenard Lance, FNP   500 mg at 04/19/21 1716  ? OLANZapine zydis (ZYPREXA) disintegrating tablet 10 mg  10 mg Oral q AM Hill, Shelbie Hutching, MD   10 mg at 04/22/21 7253  ? OLANZapine zydis (ZYPREXA) disintegrating tablet 20 mg  20 mg Oral QHS Comer Locket, MD   20 mg at 04/21/21 2103  ? ? ?Lab Results:  ?No results found for this or any previous visit (from the past 48 hour(s)). ? ? ?Blood Alcohol level:  ?Lab Results  ?Component Value Date  ? ETH  <10 04/13/2021  ? ETH <10 07/16/2020  ? ? ?Metabolic Disorder Labs: ?Lab Results  ?Component Value Date  ? HGBA1C 5.9 03/30/2021  ? MPG 120 11/23/2019  ? MPG 111.15 06/27/2018  ? ?Lab Results  ?Component Value Date  ? PROLACTIN 6.9 04/13/2021  ? PROLACTIN 3.3 (L) 11/23/2019  ? ?Lab Results  ?Component Value Date  ? CHOL 174 04/13/2021  ? TRIG 67 04/13/2021  ? HDL 47 04/13/2021  ? CHOLHDL 3.7 04/13/2021  ? VLDL  13 04/13/2021  ? LDLCALC 114 (H) 04/13/2021  ? LDLCALC 74 07/22/2020  ? ? ?Physical Findings: ?AIMS: Facial and Oral Movements ?Muscles of Facial Expression: None, normal ?Lips and Perioral Area: None, normal ?Jaw: None, normal ?Tongue: None, normal,Extremity Movements ?Upper (arms, wrists, hands, fingers): None, normal ?Lower (legs, knees, ankles, toes): None, normal, Trunk Movements ?Neck, shoulders, hips: None, normal, Overall Severity ?Severity of abnormal movements (highest score from questions above): None, normal ?Incapacitation due to abnormal movements: None, normal ?Patient's awareness of abnormal movements (rate only patient's report): No Awareness, Dental Status ?Current problems with teeth and/or dentures?: No ?Does patient usually wear dentures?: No  ? ?Musculoskeletal: ?Strength & Muscle Tone: within normal limits ?Gait & Station: normal ?Patient leans: N/A ? ?Psychiatric Specialty Exam: ? ?Presentation  ?General Appearance: Fair grooming - in hospital gown ? ?Eye Contact:Fair ? ?Speech:clear and coherent - answers in short phrases ? ?Speech Volume:Normal ? ?Mood and Affect  ?Mood:-- (described as "okay" - aloof) ? ?Affect:- constricted, ambivalent ? ?Thought Process  ?Thought Processes:Concrete, ruminative about discharge planning ? ?Orientation:to self, month, year, and city ? ?Thought Content: Appears to have poverty of thought but denies SI, HI, AVH, paranoia, ideas of reference or first rank symptoms and is not grossly responding to internal/external stimuli during exam but remains  guarded ? ?History of Schizophrenia/Schizoaffective disorder:Yes ? ?Duration of Psychotic Symptoms:Greater than six months ? ?Hallucinations: Denied ? ?Ideas of Reference:None ? ?Suicidal Thoughts: Denies ? ?Homic

## 2021-04-23 LAB — CBC WITH DIFFERENTIAL/PLATELET
Abs Immature Granulocytes: 0.03 10*3/uL (ref 0.00–0.07)
Basophils Absolute: 0 10*3/uL (ref 0.0–0.1)
Basophils Relative: 1 %
Eosinophils Absolute: 0.3 10*3/uL (ref 0.0–0.5)
Eosinophils Relative: 4 %
HCT: 42.4 % (ref 39.0–52.0)
Hemoglobin: 14 g/dL (ref 13.0–17.0)
Immature Granulocytes: 0 %
Lymphocytes Relative: 42 %
Lymphs Abs: 3.1 10*3/uL (ref 0.7–4.0)
MCH: 29.7 pg (ref 26.0–34.0)
MCHC: 33 g/dL (ref 30.0–36.0)
MCV: 90 fL (ref 80.0–100.0)
Monocytes Absolute: 0.5 10*3/uL (ref 0.1–1.0)
Monocytes Relative: 7 %
Neutro Abs: 3.3 10*3/uL (ref 1.7–7.7)
Neutrophils Relative %: 46 %
Platelets: 241 10*3/uL (ref 150–400)
RBC: 4.71 MIL/uL (ref 4.22–5.81)
RDW: 13.6 % (ref 11.5–15.5)
WBC: 7.2 10*3/uL (ref 4.0–10.5)
nRBC: 0 % (ref 0.0–0.2)

## 2021-04-23 LAB — COMPREHENSIVE METABOLIC PANEL
ALT: 24 U/L (ref 0–44)
AST: 19 U/L (ref 15–41)
Albumin: 4.1 g/dL (ref 3.5–5.0)
Alkaline Phosphatase: 65 U/L (ref 38–126)
Anion gap: 8 (ref 5–15)
BUN: 12 mg/dL (ref 6–20)
CO2: 23 mmol/L (ref 22–32)
Calcium: 9.4 mg/dL (ref 8.9–10.3)
Chloride: 108 mmol/L (ref 98–111)
Creatinine, Ser: 0.88 mg/dL (ref 0.61–1.24)
GFR, Estimated: >60 mL/min (ref >60)
Glucose, Bld: 111 mg/dL — ABNORMAL HIGH (ref 70–99)
Potassium: 4.3 mmol/L (ref 3.5–5.1)
Sodium: 139 mmol/L (ref 135–145)
Total Bilirubin: 0.2 mg/dL — ABNORMAL LOW (ref 0.3–1.2)
Total Protein: 7 g/dL (ref 6.5–8.1)

## 2021-04-23 LAB — LITHIUM LEVEL: Lithium Lvl: 0.62 mmol/L (ref 0.60–1.20)

## 2021-04-23 LAB — LIPASE, BLOOD: Lipase: 34 U/L (ref 11–51)

## 2021-04-23 NOTE — Progress Notes (Signed)
Pt presents irritable with avertive eye contact, minimal on interactions. Denies SI, HI, VH and pain. Observed pacing in room and actively talking to self. Emotional support, reassurance and encouragement provided to pt. Enteric precaution maintained without loose stools or emesis thus far this shift. Pt unable to give stool sample this shift. Fluids push maintained, pitcher given. Required multiple prompts to stay in his room for precaution as ordered.  ?Safety checks maintained at Q 15 minutes intervals without self harm gestures. Verbal education provided on all medications and effects monitored. Pt tolerates all meals and medications well. Remains safe on unit. ?

## 2021-04-23 NOTE — Progress Notes (Signed)
?   04/23/21 0500  ?Sleep  ?Number of Hours 9.25  ? ? ?

## 2021-04-23 NOTE — Progress Notes (Addendum)
[facility] MD Progress Note ? ?04/23/2021 12:26 PM ?James [name]  ?MRN:  [id number] ? ?Subjective:  James [name] is a 41 y.o. male with a history of schizophrenia, who was initially admitted for inpatient psychiatric hospitalization on 04/14/2021 for management of aggressive behaviors at home and psychiatric decompensation in context of medication noncompliance. The patient is currently on Hospital Day 9. ? ?Case was discussed in the multidisciplinary team. MAR was reviewed and patient was compliant with medications.  He did not require any PRN's for agitation. ? ?Psychiatric Team made the following recommendations yesterday:  ?  -- Continue Zyprexa 10mg  qam and 20mg  changed to 2000 hour for compliance with dosing ?            -- Continue Lithium 450mg  bid ?Li level 0.54, creatinine 0.86, TSH 1.079 ?            -- Continue Abilify 15mg  daily with Abilify maintenna 400mg  IM given 04/18/21 ? ?On assessment this AM patient reports that he he is "fine." Patient reports that he is sleeping and eating well. Patient denies beliefs of thought insertion or deletion as well as beliefs that he is receiving special messages. Patient denies SI, HI and AVH. Patient endorses that his diarrhea is improving and he is having less nausea and vomiting. Discussed pending ACTT referral intake. ? ?Patient as a pretty restricted to flat affect this AM.  ? ?Principal Problem: Schizophrenia (HCC) ?Diagnosis: Principal Problem: ?  Schizophrenia (HCC) ? ?Total Time spent with patient: I personally spent 25 minutes on the unit in direct patient care. The direct patient care time included face-to-face time with the patient, reviewing the patient's chart, communicating with other professionals, and coordinating care. Greater than 50% of this time was spent in counseling or coordinating care with the patient regarding goals of hospitalization, psycho-education, and discharge planning needs. ? ?Past Psychiatric History: see H&P ? ?Past Medical  History:  ?Past Medical History:  ?Diagnosis Date  ? Gastroesophageal reflux disease 11/25/2017  ? History reviewed. No pertinent surgical history. ?Family History:  ?Family History  ?Problem Relation Age of Onset  ? Diabetes Mother   ? Diabetes Father   ? Prostate cancer Father   ? ?Family Psychiatric  History: See H&P ?Social History:  ?Social History  ? ?Substance and Sexual Activity  ?Alcohol Use No  ? Alcohol/week: 0.0 standard drinks  ? Comment: rare alcohol  ?   ?Social History  ? ?Substance and Sexual Activity  ?Drug Use No  ?  ?Social History  ? ?Socioeconomic History  ? Marital status: Single  ?  Spouse name: Not on file  ? Number of children: Not on file  ? Years of education: Not on file  ? Highest education level: Not on file  ?Occupational History  ? Not on file  ?Tobacco Use  ? Smoking status: Every Day  ?  Packs/day: 0.50  ?  Types: Cigarettes  ? Smokeless tobacco: Never  ?Vaping Use  ? Vaping Use: Never used  ?Substance and Sexual Activity  ? Alcohol use: No  ?  Alcohol/week: 0.0 standard drinks  ?  Comment: rare alcohol  ? Drug use: No  ? Sexual activity: Yes  ?  Comment: male  ?Other Topics Concern  ? Not on file  ?Social History Narrative  ? Not on file  ? ?Social Determinants of Health  ? ?Financial Resource Strain: Not on file  ?Food Insecurity: Not on file  ?Transportation Needs: Not on file  ?Physical Activity: Not  on file  ?Stress: Not on file  ?Social Connections: Not on file  ? ?Additional Social History:  ?  ?  ?  ?  ?  ?  ?  ?  ?  ?  ?  ? ?Sleep: Good ? ?Appetite:  Fair ? ?Current Medications: ?Current Facility-Administered Medications  ?Medication Dose Route Frequency Provider Last Rate Last Admin  ? acetaminophen (TYLENOL) tablet 650 mg  650 mg Oral Q6H PRN Lenard Lance, FNP      ? alum & mag hydroxide-simeth (MAALOX/MYLANTA) 200-200-20 MG/5ML suspension 30 mL  30 mL Oral Q4H PRN Lenard Lance, FNP      ? ARIPiprazole (ABILIFY) tablet 15 mg  15 mg Oral Daily Lauro Franklin,  MD   15 mg at 04/23/21 1914  ? benztropine (COGENTIN) tablet 0.5 mg  0.5 mg Oral BID PRN Lenard Lance, FNP      ? haloperidol (HALDOL) tablet 5 mg  5 mg Oral Q6H PRN Lauro Franklin, MD      ? And  ? LORazepam (ATIVAN) tablet 1 mg  1 mg Oral Q6H PRN Lauro Franklin, MD      ? And  ? benztropine (COGENTIN) tablet 1 mg  1 mg Oral Q6H PRN Lauro Franklin, MD      ? haloperidol lactate (HALDOL) injection 5 mg  5 mg Intramuscular Q6H PRN Lauro Franklin, MD      ? And  ? LORazepam (ATIVAN) injection 1 mg  1 mg Intravenous Q6H PRN Lauro Franklin, MD      ? And  ? benztropine mesylate (COGENTIN) injection 1 mg  1 mg Intramuscular Q6H PRN Lauro Franklin, MD      ? feeding supplement (ENSURE ENLIVE / ENSURE PLUS) liquid 237 mL  237 mL Oral BID BM Hill, Shelbie Hutching, MD   237 mL at 04/22/21 1643  ? lithium carbonate (ESKALITH) CR tablet 450 mg  450 mg Oral Q12H Hill, Shelbie Hutching, MD   450 mg at 04/23/21 7829  ? magnesium hydroxide (MILK OF MAGNESIA) suspension 30 mL  30 mL Oral Daily PRN Lenard Lance, FNP      ? OLANZapine zydis (ZYPREXA) disintegrating tablet 10 mg  10 mg Oral q AM Hill, Shelbie Hutching, MD   10 mg at 04/23/21 0617  ? OLANZapine zydis (ZYPREXA) disintegrating tablet 20 mg  20 mg Oral QHS Comer Locket, MD   20 mg at 04/22/21 2048  ? pantoprazole (PROTONIX) EC tablet 40 mg  40 mg Oral Daily Comer Locket, MD   40 mg at 04/23/21 5621  ? ? ?Lab Results:  ?Results for orders placed or performed during the hospital encounter of 04/14/21 (from the past 48 hour(s))  ?Resp Panel by RT-PCR (Flu A&B, Covid) Nasopharyngeal Swab     Status: None  ? Collection Time: 04/22/21  7:12 AM  ? Specimen: Nasopharyngeal Swab; Nasopharyngeal(NP) swabs in vial transport medium  ?Result Value Ref Range  ? SARS Coronavirus 2 by RT PCR NEGATIVE NEGATIVE  ?  Comment: (NOTE) ?SARS-CoV-2 target nucleic acids are NOT DETECTED. ? ?The SARS-CoV-2 RNA is generally detectable in upper  respiratory ?specimens during the acute phase of infection. The lowest ?concentration of SARS-CoV-2 viral copies this assay can detect is ?138 copies/mL. A negative result does not preclude SARS-Cov-2 ?infection and should not be used as the sole basis for treatment or ?other patient management decisions. A negative result may occur with  ?improper specimen collection/handling,  submission of specimen other ?than nasopharyngeal swab, presence of viral mutation(s) within the ?areas targeted by this assay, and inadequate number of viral ?copies(<138 copies/mL). A negative result must be combined with ?clinical observations, patient history, and epidemiological ?information. The expected result is Negative. ? ?Fact Sheet for Patients:  ?BloggerCourse.comhttps://www.fda.gov/media/152166/download ? ?Fact Sheet for Healthcare Providers:  ?SeriousBroker.ithttps://www.fda.gov/media/152162/download ? ?This test is no t yet approved or cleared by the [place] FDA and  ?has been authorized for detection and/or diagnosis of SARS-CoV-2 by ?FDA under an Emergency Use Authorization (EUA). This EUA will remain  ?in effect (meaning this test can be used) for the duration of the ?COVID-19 declaration under Section 564(b)(1) of the Act, 21 ?U.S.C.section 360bbb-3(b)(1), unless the authorization is terminated  ?or revoked sooner.  ? ? ?  ? Influenza A by PCR NEGATIVE NEGATIVE  ? Influenza B by PCR NEGATIVE NEGATIVE  ?  Comment: (NOTE) ?The Xpert Xpress SARS-CoV-2/FLU/RSV plus assay is intended as an aid ?in the diagnosis of influenza from Nasopharyngeal swab specimens and ?should not be used as a sole basis for treatment. Nasal washings and ?aspirates are unacceptable for Xpert Xpress SARS-CoV-2/FLU/RSV ?testing. ? ?Fact Sheet for Patients: ?BloggerCourse.comhttps://www.fda.gov/media/152166/download ? ?Fact Sheet for Healthcare Providers: ?SeriousBroker.ithttps://www.fda.gov/media/152162/download ? ?This test is not yet approved or cleared by the [place] FDA and ?has been authorized for  detection and/or diagnosis of SARS-CoV-2 by ?FDA under an Emergency Use Authorization (EUA). This EUA will remain ?in effect (meaning this test can be used) for the duration of the ?COVID-19 declaration under [employer]

## 2021-04-23 NOTE — Group Note (Signed)
LCSW Group Therapy Note ? ?Group Date: 04/23/2021 ?Start Time: 1300 ?End Time: 1400 ? ? ?Type of Therapy and Topic:  Group Therapy: Anger Cues and Responses ? ?Participation Level:  Active ? ? ?Description of Group:   ?In this group, patients learned how to recognize the physical, cognitive, emotional, and behavioral responses they have to anger-provoking situations.  They identified a recent time they became angry and how they reacted.  They analyzed how their reaction was possibly beneficial and how it was possibly unhelpful.  The group discussed a variety of healthier coping skills that could help with such a situation in the future.  Focus was placed on how helpful it is to recognize the underlying emotions to our anger, because working on those can lead to a more permanent solution as well as our ability to focus on the important rather than the urgent. ? ?Therapeutic Goals: ?Patients will remember their last incident of anger and how they felt emotionally and physically, what their thoughts were at the time, and how they behaved. ?Patients will identify how their behavior at that time worked for them, as well as how it worked against them. ?Patients will explore possible new behaviors to use in future anger situations. ?Patients will learn that anger itself is normal and cannot be eliminated, and that healthier reactions can assist with resolving conflict rather than worsening situations. ? ?Summary of Patient Progress:  Patient was given packet for group and spoke with CSW one on one.  ? ?Therapeutic Modalities:   ?Cognitive Behavioral Therapy ? ? ? ?Vassie Moselle, LCSW ?04/23/2021  1:50 PM   ? ?

## 2021-04-23 NOTE — Group Note (Signed)
Recreation Therapy Group Note ? ? ?Group Topic:Problem Solving  ?Group Date: 04/23/2021 ?Start Time: 1010 ?End Time: 1100 ?Facilitators: Caroll Rancher, LRT,CTRS ?Location: [address] Day[address] ? ? ?Goal Area(s) Addresses:  ?Patient will verbalize importance of using appropriate problem solving techniques.  ?Patient will identify positive change associated with effective problem solving skills.  ? ?Group Description:  Brain Teasers.  Patients were given two worksheets with brain teasers.  Each patient was to use the clues available to figure what each brain teaser ment.  Patients were to also use their reasoning and problem solving skills to complete the worksheets.   ? ? ?Affect/Mood: N/A ?  ?Participation Level: Did not attend ?  ? ?Clinical Observations/Individualized Feedback: Pt unable to attend group due to being restricted to room due to illness.  ? ? ?Plan: Continue to engage patient in RT group sessions 2-3x/week. ? ? ?Caroll Rancher, LRT,CTRS  ?04/23/2021 12:48 PM ?

## 2021-04-23 NOTE — [facility] Group Notes (Signed)
Patient did not attend Wrap-up group. ?

## 2021-04-23 NOTE — Progress Notes (Signed)
Pt continues to be anxious to about staying in the room, but pt has been compliant.  ? ? ? 04/23/21 2100  ?Psych Admission Type (Psych Patients Only)  ?Admission Status Involuntary  ?Psychosocial Assessment  ?Patient Complaints Anxiety  ?Eye Contact Fair  ?Facial Expression Flat  ?Affect Appropriate to circumstance  ?Speech Logical/coherent  ?Interaction Minimal;Cautious  ?Motor Activity Pacing  ?Appearance/Hygiene Disheveled  ?Behavior Characteristics Cooperative  ?Mood Suspicious;Preoccupied  ?Aggressive Behavior  ?Effect No apparent injury  ?Thought Process  ?Coherency Concrete thinking;Circumstantial  ?Content Preoccupation  ?Delusions Paranoid  ?Perception Hallucinations  ?Hallucination Auditory  ?Judgment Impaired  ?Confusion None  ?Danger to Self  ?Current suicidal ideation? Denies  ?Danger to Others  ?Danger to Others None reported or observed  ? ? ?

## 2021-04-24 NOTE — Progress Notes (Signed)
Pt denies SI, HI, AVH and pain. Continues to pace hall and actively responding to internal stimuli. Reported he slept well with good appetite, congruent affect, good eye contact, minimal and concrete on interactions. Noted to be less irritable, more cooperative with verbal prompts and assistance to attend to his ADLS. Showered, bed linens changed with staff assistance. Assessed by ACTT team this shift as part of discharge planing. Emotional support, encouragement and support offered to pt. Verbal education provided on all mediations and effects monitored. Safety checks maintained at Q 15 minutes intervals without self harm gestures. Pt tolerates all meals, medications and fluids well without discomfort. Pt did not attend scheduled groups despite multiple prompts. Off Enteric precaution as he's asymptomatic for 48 hours. ?

## 2021-04-24 NOTE — Progress Notes (Signed)
Pt continues to not present staff with stool sample after repeated prompts, pt continues to have to be re-directed to stay in room multiple times through the evening ?

## 2021-04-24 NOTE — Progress Notes (Signed)
?   04/24/21 0500  ?Sleep  ?Number of Hours 7  ? ? ?

## 2021-04-24 NOTE — Hospital Course (Addendum)
DO NOT ERASE! ?During the patient's hospitalization, patient had extensive initial psychiatric evaluation, and follow-up psychiatric evaluations every day. ? ?Psychiatric diagnoses provided upon initial assessment:  ?Schizophrenia ? ?Patient's psychiatric medications were adjusted on admission: - -- Continued home Lithium 450mg  BID ?-- Continued home Zyprexa 10mg  AM  ? ? ?During the hospitalization, other adjustments were made to the patient's psychiatric medication regimen:  ?-- Patient received Abilify Maintenna ( previous medication success per family) ?- 14 day Abilify PO taper post LAI on Abilify 15mg , dc'd on Day  8 of taper ?- Continued Lithium 450mg  BID ?-- Continued Zyprexa 10mg  daily ?-- Started previous home Zyprexa 20mg  QHS ? ?Gradually, patient started adjusting to milieu.  Unfortunately patient began to display GI viral symptoms that were seen in other patient's on the unit. ID declared an outbreak and patient was placed on enteric precautions. Patient was not able to go to group therapy during this time; however, by d'c patient was looking forward to being able to rerun to therapy. Patient appeared closer to his baseline, was not agitated or violent. Patient did continued to have occasional episodes of pacing and responding to internal stimuli per staff on unit; however this appears to be patient's baseline.  ?Patient's care was discussed during the interdisciplinary team meeting every day during the hospitalization. ? ?The patient denied having side effects to prescribed psychiatric medication. ? ?The patient reports their target psychiatric symptoms of irritability, aggression, and agitation responded well to the psychiatric medications, and the patient reports overall benefit other psychiatric hospitalization. Patient also became slightly less isolative.  Supportive psychotherapy was provided to the patient when he was not in enteric isolation, and psychotherapy packets were provided to patient,  once he was in isolation.  ? ?Labs were reviewed with the patient, and abnormal results were discussed with the patient. ? ?The patient denied having suicidal thoughts more than 48 hours prior to discharge.  Patient denies having homicidal thoughts.  Patient denies having auditory hallucinations.  Patient denies any visual hallucinations.  Patient denies having paranoid thoughts. ? ?It is recommended to the patient to continue psychiatric medications as prescribed, after discharge from the hospital.   ? ?It is recommended to the patient to follow up with your outpatient psychiatric provider and PCP. ? ?Discussed with the patient, the impact of alcohol, drugs, tobacco have been there overall psychiatric and medical wellbeing, and total abstinence from substance use was recommended the patient.  ?

## 2021-04-24 NOTE — Progress Notes (Signed)
The patient indicated that he had a good day today but did not offer any details.  ?

## 2021-04-24 NOTE — Group Note (Signed)
Recreation Therapy Group Note ? ? ?Group Topic:Coping Skills  ?Group Date: 04/24/2021 ?Start Time: 1000 ?End Time: 1035 ?Facilitators: Caroll Rancher, LRT,CTRS ?Location: [address] Dayroom ? ? ?Goal Area(s) Addresses: ?Patient will define what a coping skill is. ?Patient will successfully identify positive coping skills they can use post d/c.  ?Patient will acknowledge benefit(s) of using learned coping skills post d/c. ?  ?Group Description:  Coping A to Z. Patient asked to identify what a coping skill is and when they use them. Patients with Clinical research associate discussed healthy versus unhealthy coping skills. Next patients were given a blank worksheet titled "Coping Skills A-Z".  Patients were instructed to come up with at least one positive coping skill per letter of the alphabet.  Patients were given 15 minutes to brainstorm to come up their coping skills, before ideas were presented to the large group. Patients and LRT debriefed on the importance of coping skill selection based on situation and back-up plans when a skill tried is not effective. At the end of group, patients were given an handout of alphabetized strategies to keep for future reference. ? ? ?Affect/Mood: N/A ?  ?Participation Level: Did not attend ?  ? ?Clinical Observations/Individualized Feedback: Due to room restriction due to illness, pt unable to attend group session.  ? ? ?Plan: Continue to engage patient in RT group sessions 2-3x/week. ? ? ?Caroll Rancher, LRT,CTRS ?04/24/2021 11:15 AM ?

## 2021-04-24 NOTE — Progress Notes (Signed)
[facility] MD Progress Note ? ?04/24/2021 12:55 PM ?James [name]  ?MRN:  [id number] ?Subjective:  James [name] is a 41 y.o. male with a history of schizophrenia, who was initially admitted for inpatient psychiatric hospitalization on 04/14/2021 for management of aggressive behaviors at home and psychiatric decompensation in context of medication noncompliance. The patient is currently on Hospital Day 9. ?  ?Case was discussed in the multidisciplinary team. MAR was reviewed and patient was compliant with medications.  He did not require any PRN's for agitation. Rn reports that yesterday patient was pacing and talking to himself.  ?  ?Psychiatric Team made the following recommendations yesterday:  ?  -- Continue Zyprexa 10mg  qam and 20mg  changed to 2000 hour for compliance with dosing ?            -- Continue Lithium 450mg  bid ?Li level 0.54, creatinine 0.86, TSH 1.079 ?            -- Continue Abilify 15mg  daily with Abilify maintenna 400mg  IM given 04/18/21 ?  ?On assessment this AM patient reports that he is not having episodes of emesis or diarrhea. Patient reports that he is feeling better and is sleeping and eating well. Patient reports that he intends to talk with ACT today when they come. Patient reports that he feels safe in the hospital and denies having special powers, paranoia or AVH. Patient does not recall pacing and talking to himself as described by RN, yesterday. Patient endorsed that he was interested in returning to group.Patient denies SI, HI and AVH.  ? ?Patient remains on enteric precautions per ID and is not able to go to group. Despite being confined to his room, patient had appeared to make some effort to clean.  ?Principal Problem: Schizophrenia (HCC) ?Diagnosis: Principal Problem: ?  Schizophrenia (HCC) ? ?Total Time spent with patient: 20 minutes ? ?Past Psychiatric History: See H&P ? ?Past Medical History:  ?Past Medical History:  ?Diagnosis Date  ? Gastroesophageal reflux disease 11/25/2017   ? History reviewed. No pertinent surgical history. ?Family History:  ?Family History  ?Problem Relation Age of Onset  ? Diabetes Mother   ? Diabetes Father   ? Prostate cancer Father   ? ?Family Psychiatric  History:  See H&P ?Social History:  ?Social History  ? ?Substance and Sexual Activity  ?Alcohol Use No  ? Alcohol/week: 0.0 standard drinks  ? Comment: rare alcohol  ?   ?Social History  ? ?Substance and Sexual Activity  ?Drug Use No  ?  ?Social History  ? ?Socioeconomic History  ? Marital status: Single  ?  Spouse name: Not on file  ? Number of children: Not on file  ? Years of education: Not on file  ? Highest education level: Not on file  ?Occupational History  ? Not on file  ?Tobacco Use  ? Smoking status: Every Day  ?  Packs/day: 0.50  ?  Types: Cigarettes  ? Smokeless tobacco: Never  ?Vaping Use  ? Vaping Use: Never used  ?Substance and Sexual Activity  ? Alcohol use: No  ?  Alcohol/week: 0.0 standard drinks  ?  Comment: rare alcohol  ? Drug use: No  ? Sexual activity: Yes  ?  Comment: male  ?Other Topics Concern  ? Not on file  ?Social History Narrative  ? Not on file  ? ?Social Determinants of Health  ? ?Financial Resource Strain: Not on file  ?Food Insecurity: Not on file  ?Transportation Needs: Not on file  ?Physical  Activity: Not on file  ?Stress: Not on file  ?Social Connections: Not on file  ? ?Additional Social History:  ?  ?  ?  ?  ?  ?  ?  ?  ?  ?  ?  ? ?Sleep: Good ? ?Appetite:  Good ? ?Current Medications: ?Current Facility-Administered Medications  ?Medication Dose Route Frequency Provider Last Rate Last Admin  ? acetaminophen (TYLENOL) tablet 650 mg  650 mg Oral Q6H PRN Lenard LanceAllen, Tina L, FNP      ? alum & mag hydroxide-simeth (MAALOX/MYLANTA) 200-200-20 MG/5ML suspension 30 mL  30 mL Oral Q4H PRN Lenard LanceAllen, Tina L, FNP      ? ARIPiprazole (ABILIFY) tablet 15 mg  15 mg Oral Daily Lauro FranklinPashayan, Alexander S, MD   15 mg at 04/24/21 [id number]  ? benztropine (COGENTIN) tablet 0.5 mg  0.5 mg Oral BID PRN Lenard LanceAllen,  Tina L, FNP      ? haloperidol (HALDOL) tablet 5 mg  5 mg Oral Q6H PRN Lauro FranklinPashayan, Alexander S, MD      ? And  ? LORazepam (ATIVAN) tablet 1 mg  1 mg Oral Q6H PRN Lauro FranklinPashayan, Alexander S, MD      ? And  ? benztropine (COGENTIN) tablet 1 mg  1 mg Oral Q6H PRN Lauro FranklinPashayan, Alexander S, MD      ? haloperidol lactate (HALDOL) injection 5 mg  5 mg Intramuscular Q6H PRN Lauro FranklinPashayan, Alexander S, MD      ? And  ? LORazepam (ATIVAN) injection 1 mg  1 mg Intravenous Q6H PRN Lauro FranklinPashayan, Alexander S, MD      ? And  ? benztropine mesylate (COGENTIN) injection 1 mg  1 mg Intramuscular Q6H PRN Lauro FranklinPashayan, Alexander S, MD      ? feeding supplement (ENSURE ENLIVE / ENSURE PLUS) liquid 237 mL  237 mL Oral BID BM Hill, Shelbie HutchingStephanie Leigh, MD   237 mL at 04/23/21 1335  ? lithium carbonate (ESKALITH) CR tablet 450 mg  450 mg Oral Q12H Hill, Shelbie HutchingStephanie Leigh, MD   450 mg at 04/24/21 [id number]  ? magnesium hydroxide (MILK OF MAGNESIA) suspension 30 mL  30 mL Oral Daily PRN Lenard LanceAllen, Tina L, FNP      ? OLANZapine zydis (ZYPREXA) disintegrating tablet 10 mg  10 mg Oral q AM Hill, Shelbie HutchingStephanie Leigh, MD   10 mg at 04/24/21 0618  ? OLANZapine zydis (ZYPREXA) disintegrating tablet 20 mg  20 mg Oral QHS Comer LocketSingleton, Amy E, MD   20 mg at 04/23/21 2042  ? pantoprazole (PROTONIX) EC tablet 40 mg  40 mg Oral Daily Comer LocketSingleton, Amy E, MD   40 mg at 04/24/21 [id number]  ? ? ?Lab Results:  ?Results for orders placed or performed during the hospital encounter of 04/14/21 (from the past 48 hour(s))  ?CBC with Differential/Platelet     Status: None  ? Collection Time: 04/23/21  6:23 PM  ?Result Value Ref Range  ? WBC 7.2 4.0 - 10.5 K/uL  ? RBC 4.71 4.22 - 5.81 MIL/uL  ? Hemoglobin 14.0 13.0 - 17.0 g/dL  ? HCT 42.4 39.0 - 52.0 %  ? MCV 90.0 80.0 - 100.0 fL  ? MCH 29.7 26.0 - 34.0 pg  ? MCHC 33.0 30.0 - 36.0 g/dL  ? RDW 13.6 11.5 - 15.5 %  ? Platelets 241 150 - 400 K/uL  ? nRBC 0.0 0.0 - 0.2 %  ? Neutrophils Relative % 46 %  ? Neutro Abs 3.3 1.7 - 7.7 K/uL  ? Lymphocytes Relative 42 %  ?  Lymphs Abs 3.1 0.7 - 4.0 K/uL  ? Monocytes Relative 7 %  ? Monocytes Absolute 0.5 0.1 - 1.0 K/uL  ? Eosinophils Relative 4 %  ? Eosinophils Absolute 0.3 0.0 - 0.5 K/uL  ? Basophils Relative 1 %  ? Basophils Absolute 0.0 0.0 - 0.1 K/uL  ? Immature Granulocytes 0 %  ? Abs Immature Granulocytes 0.03 0.00 - 0.07 K/uL  ?  Comment: Performed at [facility], [address], [place], [place] [address]  ?Comprehensive metabolic panel     Status: Abnormal  ? Collection Time: 04/23/21  6:23 PM  ?Result Value Ref Range  ? Sodium 139 135 - 145 mmol/L  ? Potassium 4.3 3.5 - 5.1 mmol/L  ? Chloride 108 98 - 111 mmol/L  ? CO2 23 22 - 32 mmol/L  ? Glucose, Bld 111 (H) 70 - 99 mg/dL  ?  Comment: Glucose reference range applies only to samples taken after fasting for at least 8 hours.  ? BUN 12 6 - 20 mg/dL  ? Creatinine, Ser 0.88 0.61 - 1.24 mg/dL  ? Calcium 9.4 8.9 - 10.3 mg/dL  ? Total Protein 7.0 6.5 - 8.1 g/dL  ? Albumin 4.1 3.5 - 5.0 g/dL  ? AST 19 15 - 41 U/L  ? ALT 24 0 - 44 U/L  ? Alkaline Phosphatase 65 38 - 126 U/L  ? Total Bilirubin 0.2 (L) 0.3 - 1.2 mg/dL  ? GFR, Estimated >60 >60 mL/min  ?  Comment: (NOTE) ?Calculated using the CKD-EPI Creatinine Equation (2021) ?  ? Anion gap 8 5 - 15  ?  Comment: Performed at [facility], [address] [place], [place] [address]  ?Lipase, blood     Status: None  ? Collection Time: 04/23/21  6:23 PM  ?Result Value Ref Range  ? Lipase 34 11 - 51 U/L  ?  Comment: Performed at [facility], [address], [place], [place] [address]  ?Lithium level     Status: None  ? Collection Time: 04/23/21  6:23 PM  ?Result Value Ref Range  ? Lithium Lvl 0.62 0.60 - 1.20 mmol/L  ?  Comment: Performed at [facility], [address] [place], [place] [address]  ? ? ?Blood Alcohol level:  ?Lab Results  ?Component Value Date  ? ETH <10 04/13/2021  ? ETH <10 07/16/2020  ? ? ?Metabolic Disorder Labs: ?Lab Results  ?Component  Value Date  ? HGBA1C 5.9 03/30/2021  ? MPG 120 11/23/2019  ? MPG 111.15 06/27/2018  ? ?Lab Results  ?Component Value Date  ? PROLACTIN 6.9 04/13/2021  ? PROLACTIN 3.3 (L) 11/23/2019  ? ?Lab Results  ?Componen

## 2021-04-24 NOTE — Progress Notes (Signed)
D:  James [name] was noted pacing the hall talking to himself.  When asked how he was, he replied "I'm good" with no further explanation.  He denied SI/HI.  He denied any pain or discomfort and appears to be in no acute distress.  He took HS medications without difficulty.   ?A:  1:1 with RN for support and encouragement.  Medications given as ordered.  Q 15 minute checks maintained for safety.  Encouraged participation in group and unit activities.   ?R:  He is currently resting with his eyes closed and appears to be asleep.  He remains safe on the unit.   ? ? ? 04/24/21 2104  ?Psych Admission Type (Psych Patients Only)  ?Admission Status Involuntary  ?Psychosocial Assessment  ?Patient Complaints Restlessness  ?Eye Contact Fair  ?Facial Expression Flat  ?Affect Flat  ?Speech Logical/coherent  ?Interaction Cautious;Minimal  ?Motor Activity Pacing  ?Appearance/Hygiene Disheveled  ?Behavior Characteristics Cooperative  ?Mood Suspicious;Preoccupied  ?Thought Process  ?Coherency Circumstantial;Concrete thinking  ?Content Preoccupation  ?Delusions Paranoid  ?Perception Hallucinations  ?Hallucination Auditory  ?Judgment Poor  ?Confusion None  ?Danger to Self  ?Current suicidal ideation? Denies  ?Danger to Others  ?Danger to Others None reported or observed  ? ? ?

## 2021-04-25 ENCOUNTER — Encounter (HOSPITAL_COMMUNITY): Payer: Self-pay

## 2021-04-25 MED ORDER — ARIPIPRAZOLE 15 MG PO TABS: 15.0000 mg | ORAL_TABLET | Freq: Every day | ORAL | 0 refills | Status: DC

## 2021-04-25 MED ORDER — LITHIUM CARBONATE ER 450 MG PO TBCR: 450.0000 mg | EXTENDED_RELEASE_TABLET | Freq: Two times a day (BID) | ORAL | 0 refills | Status: DC

## 2021-04-25 NOTE — Progress Notes (Signed)
?  [facility] Adult Case Management Discharge Plan : ? ?Will you be returning to the same living situation after discharge:  Yes,  to home ?At discharge, do you have transportation home?: Yes,  mother to pick this patient up ?Do you have the ability to pay for your medications: Yes,  has insurance ? ?Release of information consent forms completed and in the chart;  Patient's signature needed at discharge. ? ?Patient to Follow up at: ? Follow-up Information   ? ? [facility] Follow up.   ?Why: ACTT intake has been completed. ACTT will follow up with you at home after discharge. ?Contact information: ?[address] ?[place] [address] ?[phone] ? ? ?  ?  ? ? [facility] Follow up on 05/02/2021.   ?Why: You have a hospital follow up appointment for therapy and medication management services on 05/02/21 at 9:30 am.  This will be a Virtual telehealth appointment. ?Contact information: ?[address]  ?[address] ?[place] [address] ?[phone] ? ? ?  ?  ? ?  ?  ? ?  ? ? ?Next level of care provider has access to [facility] Link:no ? ?Safety Planning and Suicide Prevention discussed: Yes,  with mother ? ?  ? ?Has patient been referred to the Quitline?: Patient refused referral ? ?Patient has been referred for addiction treatment: N/A ? ?Otelia Santee, LCSW ?04/25/2021, 9:03 AM ?

## 2021-04-25 NOTE — Plan of Care (Signed)
Patient did not attend recreation therapy group sessions.   Kamdon Reisig, LRT,CTRS 

## 2021-04-25 NOTE — Progress Notes (Signed)
Recreation Therapy Notes ? ?INPATIENT RECREATION TR PLAN ? ?Patient Details ?Name: James [name] ?MRN: [id number] ?DOB: [birth year] ?Today's Date: 04/25/2021 ? ?Rec Therapy Plan ?Is patient appropriate for Therapeutic Recreation?: Yes ?Treatment times per week: about 3 days ?Estimated Length of Stay: 5-7 ?TR Treatment/Interventions: Group participation (Comment) ? ?Discharge Criteria ?Pt will be discharged from therapy if:: Discharged ?Treatment plan/goals/alternatives discussed and agreed upon by:: Patient/family ? ?Discharge Summary ?Short term goals set: See patient care plan ?Short term goals met: Not met ?Reason goals not met: Pt did not attend group sessions. ?Therapeutic equipment acquired: N/A ?Reason patient discharged from therapy: Discharge from hospital ?Pt/family agrees with progress & goals achieved: Yes ?Date patient discharged from therapy: 04/25/21 ? ? ? ?Victorino Sparrow, LRT,CTRS ?Victorino Sparrow A ?04/25/2021, 11:46 AM ?

## 2021-04-25 NOTE — Discharge Summary (Signed)
Physician Discharge Summary Note ? ?Patient:  James [name] is an 41 y.o., male ?MRN:  [id number] ?DOB:  [birth year] ?Patient phone:  [phone] (home)  ?Patient address:   ?[address] ?[place] [address],  ?Total Time spent with patient: 20 minutes ? ?Date of Admission:  04/14/2021 ?Date of Discharge: 04/25/2021 ? ?Reason for Admission:  IVC,  Family reported that the patient had been aggressive in the home and behavior had become erratic recently. ? ?Principal Problem: Schizophrenia (HCC) ?Discharge Diagnoses: Principal Problem: ?  Schizophrenia (HCC) ? ? ?Past Psychiatric History: previously diagnosed with schizophrenia. Has been to [facility] 2020, 2021. Has been on wellbutrin, abilify, latuda, lithium, risperdal, risperdal consta, zyprexa ? ?Past Medical History:  ?Past Medical History:  ?Diagnosis Date  ? Gastroesophageal reflux disease 11/25/2017  ? History reviewed. No pertinent surgical history. ?Family History:  ?Family History  ?Problem Relation Age of Onset  ? Diabetes Mother   ? Diabetes Father   ? Prostate cancer Father   ? ?Family Psychiatric  History: N/A ?Social History:  ?Social History  ? ?Substance and Sexual Activity  ?Alcohol Use No  ? Alcohol/week: 0.0 standard drinks  ? Comment: rare alcohol  ?   ?Social History  ? ?Substance and Sexual Activity  ?Drug Use No  ?  ?Social History  ? ?Socioeconomic History  ? Marital status: Single  ?  Spouse name: Not on file  ? Number of children: Not on file  ? Years of education: Not on file  ? Highest education level: Not on file  ?Occupational History  ? Not on file  ?Tobacco Use  ? Smoking status: Every Day  ?  Packs/day: 0.50  ?  Types: Cigarettes  ? Smokeless tobacco: Never  ?Vaping Use  ? Vaping Use: Never used  ?Substance and Sexual Activity  ? Alcohol use: No  ?  Alcohol/week: 0.0 standard drinks  ?  Comment: rare alcohol  ? Drug use: No  ? Sexual activity: Yes  ?  Comment: male  ?Other Topics Concern  ? Not on file  ?Social History  Narrative  ? Not on file  ? ?Social Determinants of Health  ? ?Financial Resource Strain: Not on file  ?Food Insecurity: Not on file  ?Transportation Needs: Not on file  ?Physical Activity: Not on file  ?Stress: Not on file  ?Social Connections: Not on file  ? ? ?Hospital Course:  During the patient's hospitalization, patient had extensive initial psychiatric evaluation, and follow-up psychiatric evaluations every day. ?  ?Psychiatric diagnoses provided upon initial assessment:  ?Schizophrenia ?  ?Patient's psychiatric medications were adjusted on admission: - -- Continued home Lithium 450mg  BID ?-- Continued home Zyprexa 10mg  AM  ?  ?  ?During the hospitalization, other adjustments were made to the patient's psychiatric medication regimen:  ?-- Patient received Abilify Maintenna ( previous medication success per family) ?- 14 day Abilify PO taper post LAI on Abilify 15mg , dc'd on Day  8 of taper ?- Continued Lithium 450mg  BID ?-- Continued Zyprexa 10mg  daily ?-- Started previous home Zyprexa 20mg  QHS ?  ?Gradually, patient started adjusting to milieu.  Unfortunately patient began to display GI viral symptoms that were seen in other patient's on the unit. ID declared an outbreak and patient was placed on enteric precautions. Patient was not able to go to group therapy during this time; however, by d'c patient was looking forward to being able to rerun to therapy. Patient appeared closer to his baseline, was not agitated or violent.  Patient did continued to have occasional episodes of pacing and responding to internal stimuli per staff on unit; however this appears to be patient's baseline.  ?Patient's care was discussed during the interdisciplinary team meeting every day during the hospitalization. ?  ?The patient denied having side effects to prescribed psychiatric medication. ?  ?The patient reports their target psychiatric symptoms of irritability, aggression, and agitation responded well to the psychiatric  medications, and the patient reports overall benefit other psychiatric hospitalization. Patient also became slightly less isolative.  Supportive psychotherapy was provided to the patient when he was not in enteric isolation, and psychotherapy packets were provided to patient, once he was in isolation.  ?  ?Labs were reviewed with the patient, and abnormal results were discussed with the patient. ?  ?The patient denied having suicidal thoughts more than 48 hours prior to discharge.  Patient denies having homicidal thoughts.  Patient denies having auditory hallucinations.  Patient denies any visual hallucinations.  Patient denies having paranoid thoughts. ?  ?It is recommended to the patient to continue psychiatric medications as prescribed, after discharge from the hospital.   ?  ?It is recommended to the patient to follow up with your outpatient psychiatric provider and PCP. ?  ?Discussed with the patient, the impact of alcohol, drugs, tobacco have been there overall psychiatric and medical wellbeing, and total abstinence from substance use was recommended the patient.  ? ?Physical Findings: ?AIMS: Facial and Oral Movements ?Muscles of Facial Expression: None, normal ?Lips and Perioral Area: None, normal ?Jaw: None, normal ?Tongue: None, normal,Extremity Movements ?Upper (arms, wrists, hands, fingers): None, normal ?Lower (legs, knees, ankles, toes): None, normal, Trunk Movements ?Neck, shoulders, hips: None, normal, Overall Severity ?Severity of abnormal movements (highest score from questions above): None, normal ?Incapacitation due to abnormal movements: None, normal ?Patient's awareness of abnormal movements (rate only patient's report): No Awareness, Dental Status ?Current problems with teeth and/or dentures?: No ?Does patient usually wear dentures?: No  ?CIWA:    ?COWS:    ? ?Musculoskeletal: ?Strength & Muscle Tone: within normal limits ?Gait & Station: normal ?Patient leans: N/A ? ? ?Psychiatric Specialty  Exam: ? ?Presentation  ?General Appearance: Appropriate for Environment ? ?Eye Contact:Good ? ?Speech:Clear and Coherent ? ?Speech Volume:Decreased ? ?Handedness:Right ? ? ?Mood and Affect  ?Mood:-- ("good") ? ?Affect:Blunt ? ? ?Thought Process  ?Thought Processes:Linear ? ?Descriptions of Associations:Circumstantial ? ?Orientation:Full (Time, Place and Person) ? ?Thought Content:Logical ? ?History of Schizophrenia/Schizoaffective disorder:No ? ?Duration of Psychotic Symptoms:Greater than six months ? ?Hallucinations:Hallucinations: None ? ?Ideas of Reference:None ? ?Suicidal Thoughts:Suicidal Thoughts: No ? ?Homicidal Thoughts:Homicidal Thoughts: No ? ? ?Sensorium  ?Memory:Immediate Fair; Recent Fair ? ?Judgment:-- (Improved) ? ?Insight:Shallow ? ? ?Executive Functions  ?Concentration:Fair ? ?Attention Span:Fair ? ?Recall:Fair ? ?Fund of Knowledge:Fair ? ?Language:Fair (limited) ? ? ?Psychomotor Activity  ?Psychomotor Activity:Psychomotor Activity: Normal ? ? ?Assets  ?Assets:Resilience; Social Support ? ? ?Sleep  ?Sleep:Sleep: Good ? ? ? ?Physical Exam: ?Physical Exam ?HENT:  ?   Head: Normocephalic and atraumatic.  ?Pulmonary:  ?   Effort: Pulmonary effort is normal.  ?Neurological:  ?   Mental Status: He is alert and oriented to person, place, and time.  ? ?Review of Systems  ?Psychiatric/Behavioral:  Negative for depression, hallucinations and suicidal ideas.   ?Blood pressure 118/76, pulse (!) 110, temperature 98.5 ?F (36.9 ?C), temperature source Oral, resp. rate 19, height 5\' 10"  (1.778 m), weight 79.4 kg, SpO2 98 %. Body mass index is 25.11 kg/m?. ? ? ?Social History  ? ?  Tobacco Use  ?Smoking Status Every Day  ? Packs/day: 0.50  ? Types: Cigarettes  ?Smokeless Tobacco Never  ? ?Tobacco Cessation:  N/A, patient does not currently use tobacco products ? ? ?Blood Alcohol level:  ?Lab Results  ?Component Value Date  ? ETH <10 04/13/2021  ? ETH <10 07/16/2020  ? ? ?Metabolic Disorder Labs:  ?Lab Results   ?Component Value Date  ? HGBA1C 5.9 03/30/2021  ? MPG 120 11/23/2019  ? MPG 111.15 06/27/2018  ? ?Lab Results  ?Component Value Date  ? PROLACTIN 6.9 04/13/2021  ? PROLACTIN 3.3 (L) 11/23/2019  ? ?Lab Results  ?Componen

## 2021-04-25 NOTE — BH IP Treatment Plan (Signed)
Interdisciplinary Treatment and Diagnostic Plan Update ? ?04/25/2021 ?Time of Session: 9:30am  ?James [name] ?MRN: [id number] ? ?Principal Diagnosis: Schizophrenia (HCC) ? ?Secondary Diagnoses: Principal Problem: ?  Schizophrenia (HCC) ? ? ?Current Medications:  ?Current Facility-Administered Medications  ?Medication Dose Route Frequency Provider Last Rate Last Admin  ? acetaminophen (TYLENOL) tablet 650 mg  650 mg Oral Q6H PRN Lenard Lance, FNP      ? alum & mag hydroxide-simeth (MAALOX/MYLANTA) 200-200-20 MG/5ML suspension 30 mL  30 mL Oral Q4H PRN Lenard Lance, FNP      ? ARIPiprazole (ABILIFY) tablet 15 mg  15 mg Oral Daily Lauro Franklin, MD   15 mg at 04/25/21 9735  ? benztropine (COGENTIN) tablet 0.5 mg  0.5 mg Oral BID PRN Lenard Lance, FNP      ? haloperidol (HALDOL) tablet 5 mg  5 mg Oral Q6H PRN Lauro Franklin, MD      ? And  ? LORazepam (ATIVAN) tablet 1 mg  1 mg Oral Q6H PRN Lauro Franklin, MD      ? And  ? benztropine (COGENTIN) tablet 1 mg  1 mg Oral Q6H PRN Lauro Franklin, MD      ? haloperidol lactate (HALDOL) injection 5 mg  5 mg Intramuscular Q6H PRN Lauro Franklin, MD      ? And  ? LORazepam (ATIVAN) injection 1 mg  1 mg Intravenous Q6H PRN Lauro Franklin, MD      ? And  ? benztropine mesylate (COGENTIN) injection 1 mg  1 mg Intramuscular Q6H PRN Lauro Franklin, MD      ? feeding supplement (ENSURE ENLIVE / ENSURE PLUS) liquid 237 mL  237 mL Oral BID BM Hill, Shelbie Hutching, MD   237 mL at 04/25/21 0944  ? lithium carbonate (ESKALITH) CR tablet 450 mg  450 mg Oral Q12H Hill, Shelbie Hutching, MD   450 mg at 04/25/21 3299  ? magnesium hydroxide (MILK OF MAGNESIA) suspension 30 mL  30 mL Oral Daily PRN Lenard Lance, FNP      ? OLANZapine zydis (ZYPREXA) disintegrating tablet 10 mg  10 mg Oral q AM Hill, Shelbie Hutching, MD   10 mg at 04/25/21 2426  ? OLANZapine zydis (ZYPREXA) disintegrating tablet 20 mg  20 mg Oral QHS Comer Locket, MD    20 mg at 04/24/21 2104  ? pantoprazole (PROTONIX) EC tablet 40 mg  40 mg Oral Daily Comer Locket, MD   40 mg at 04/25/21 8341  ? ?PTA Medications: ?Medications Prior to Admission  ?Medication Sig Dispense Refill Last Dose  ? benztropine (COGENTIN) 0.5 MG tablet Take 1 tablet (0.5 mg total) by mouth 2 (two) times daily as needed for tremors (EPS). 60 tablet 0   ? cholecalciferol (VITAMIN D3) 25 MCG (1000 UNIT) tablet Take 2,000 Units by mouth daily.     ? doxepin (SINEQUAN) 25 MG capsule Take 1 capsule (25 mg total) by mouth at bedtime as needed (insomnia). 30 capsule 0   ? lithium carbonate (ESKALITH) 450 MG CR tablet Take 1 tablet (450 mg total) by mouth 2 (two) times daily. For mood stabilization 60 tablet 0   ? metFORMIN (GLUCOPHAGE) 500 MG tablet TAKE 1 TABLET BY MOUTH TWICE DAILY WITH MEALS (Patient taking differently: Take 500 mg by mouth 2 (two) times daily with a meal.) 180 tablet 0   ? Multiple Vitamins-Minerals (MULTIVITAMIN WITH MINERALS) tablet Take 1 tablet by mouth daily.     ?  OLANZapine (ZYPREXA) 10 MG tablet Take 1 tablet (10 mg total) by mouth daily. For mood control 30 tablet 0   ? OLANZapine (ZYPREXA) 20 MG tablet Take 1 tablet (20 mg total) by mouth at bedtime. For mood control 30 tablet 0   ? ? ?Patient Stressors: Marital or family conflict   ?Medication change or noncompliance   ? ?Patient Strengths: Communication skills  ?Motivation for treatment/growth  ?Supportive family/friends  ? ?Treatment Modalities: Medication Management, Group therapy, Case management,  ?1 to 1 session with clinician, Psychoeducation, Recreational therapy. ? ? ?Physician Treatment Plan for Primary Diagnosis: Schizophrenia (HCC) ?Long Term Goal(s): Improvement in symptoms so as ready for discharge  ? ?Short Term Goals: Ability to identify changes in lifestyle to reduce recurrence of condition will improve ?Ability to verbalize feelings will improve ?Ability to disclose and discuss suicidal ideas ?Ability to  demonstrate self-control will improve ?Ability to identify and develop effective coping behaviors will improve ?Ability to maintain clinical measurements within normal limits will improve ?Compliance with prescribed medications will improve ? ?Medication Management: Evaluate patient's response, side effects, and tolerance of medication regimen. ? ?Therapeutic Interventions: 1 to 1 sessions, Unit Group sessions and Medication administration. ? ?Evaluation of Outcomes: Adequate for Discharge ? ?Physician Treatment Plan for Secondary Diagnosis: Principal Problem: ?  Schizophrenia (HCC) ? ?Long Term Goal(s): Improvement in symptoms so as ready for discharge  ? ?Short Term Goals: Ability to identify changes in lifestyle to reduce recurrence of condition will improve ?Ability to verbalize feelings will improve ?Ability to disclose and discuss suicidal ideas ?Ability to demonstrate self-control will improve ?Ability to identify and develop effective coping behaviors will improve ?Ability to maintain clinical measurements within normal limits will improve ?Compliance with prescribed medications will improve    ? ?Medication Management: Evaluate patient's response, side effects, and tolerance of medication regimen. ? ?Therapeutic Interventions: 1 to 1 sessions, Unit Group sessions and Medication administration. ? ?Evaluation of Outcomes: Adequate for Discharge ? ? ?RN Treatment Plan for Primary Diagnosis: Schizophrenia (HCC) ?Long Term Goal(s): Knowledge of disease and therapeutic regimen to maintain health will improve ? ?Short Term Goals: Ability to remain free from injury will improve, Ability to participate in decision making will improve, Ability to verbalize feelings will improve, Ability to disclose and discuss suicidal ideas, and Ability to identify and develop effective coping behaviors will improve ? ?Medication Management: RN will administer medications as ordered by provider, will assess and evaluate patient's  response and provide education to patient for prescribed medication. RN will report any adverse and/or side effects to prescribing provider. ? ?Therapeutic Interventions: 1 on 1 counseling sessions, Psychoeducation, Medication administration, Evaluate responses to treatment, Monitor vital signs and CBGs as ordered, Perform/monitor CIWA, COWS, AIMS and Fall Risk screenings as ordered, Perform wound care treatments as ordered. ? ?Evaluation of Outcomes: Adequate for Discharge ? ? ?LCSW Treatment Plan for Primary Diagnosis: Schizophrenia (HCC) ?Long Term Goal(s): Safe transition to appropriate next level of care at discharge, Engage patient in therapeutic group addressing interpersonal concerns. ? ?Short Term Goals: Engage patient in aftercare planning with referrals and resources, Increase social support, Increase emotional regulation, Facilitate acceptance of mental health diagnosis and concerns, Identify triggers associated with mental health/substance abuse issues, and Increase skills for wellness and recovery ? ?Therapeutic Interventions: Assess for all discharge needs, 1 to 1 time with Child psychotherapistocial worker, Explore available resources and support systems, Assess for adequacy in community support network, Educate family and significant other(s) on suicide prevention, Complete Psychosocial Assessment, Interpersonal group  therapy. ? ?Evaluation of Outcomes: Adequate for Discharge ? ? ?Progress in Treatment: ?Attending groups: No. ?Participating in groups: No. ?Taking medication as prescribed: Yes. ?Toleration medication: Yes. ?Family/Significant other contact made: No, will contact:  Mother and Legal Guardian  ?Patient understands diagnosis: No. ?Discussing patient identified problems/goals with staff: Yes. ?Medical problems stabilized or resolved: Yes. ?Denies suicidal/homicidal ideation: Yes. ?Issues/concerns per patient self-inventory: No. ?  ?  ?New problem(s) identified: No, Describe:  none ?  ?New Short Term/Long  Term Goal(s): medication stabilization, elimination of SI thoughts, development of comprehensive mental wellness plan.  ?  ?  ?Patient Goals:  Did not attend ?  ?Discharge Plan or Barriers: Patient discharged hom

## 2021-04-25 NOTE — BHH Suicide Risk Assessment (Addendum)
Suicide Risk Assessment ? ?Discharge Assessment    ?[facility] Discharge Suicide Risk Assessment ? ? ?Principal Problem: Schizophrenia (HCC) ?Discharge Diagnoses: Principal Problem: ?  Schizophrenia (HCC) ? ? ?Total Time spent with patient: 20 minutes ?During the patient's hospitalization, patient had extensive initial psychiatric evaluation, and follow-up psychiatric evaluations every day. ? ?Psychiatric diagnoses provided upon initial assessment:  ?Schizophrenia ? ?Patient's psychiatric medications were adjusted on admission: - -- Continued home Lithium 450mg  BID ?-- Continued home Zyprexa 10mg  AM  ? ? ?During the hospitalization, other adjustments were made to the patient's psychiatric medication regimen:  ?-- Patient received Abilify Maintenna ( previous medication success per family) ?- 14 day Abilify PO taper post LAI on Abilify 15mg , dc'd on Day  8 of taper ?- Continued Lithium 450mg  BID ?-- Continued Zyprexa 10mg  daily ?-- Started previous home Zyprexa 20mg  QHS ? ?Gradually, patient started adjusting to milieu.  Unfortunately patient began to display GI viral symptoms that were seen in other patient's on the unit. ID declared an outbreak and patient was placed on enteric precautions. Patient was not able to go to group therapy during this time; however, by d'c patient was looking forward to being able to rerun to therapy. Patient appeared closer to his baseline, was not agitated or violent. Patient did continued to have occasional episodes of pacing and responding to internal stimuli per staff on unit; however this appears to be patient's baseline.  ?Patient's care was discussed during the interdisciplinary team meeting every day during the hospitalization. ? ?The patient denied having side effects to prescribed psychiatric medication. ? ?The patient reports their target psychiatric symptoms of irritability, aggression, and agitation responded well to the psychiatric medications, and the patient reports overall  benefit other psychiatric hospitalization. Patient also became slightly less isolative.  Supportive psychotherapy was provided to the patient when he was not in enteric isolation, and psychotherapy packets were provided to patient, once he was in isolation.  ? ?Labs were reviewed with the patient, and abnormal results were discussed with the patient. ? ?The patient denied having suicidal thoughts more than 48 hours prior to discharge.  Patient denies having homicidal thoughts.  Patient denies having auditory hallucinations.  Patient denies any visual hallucinations.  Patient denies having paranoid thoughts. ? ?It is recommended to the patient to continue psychiatric medications as prescribed, after discharge from the hospital.   ? ?It is recommended to the patient to follow up with your outpatient psychiatric provider and PCP. ? ?Discussed with the patient, the impact of alcohol, drugs, tobacco have been there overall psychiatric and medical wellbeing, and total abstinence from substance use was recommended the patient.  ?Musculoskeletal: ?Strength & Muscle Tone: within normal limits ?Gait & Station: normal ?Patient leans: N/A ? ?Psychiatric Specialty Exam ? ?Presentation  ?General Appearance: Appropriate for Environment ? ?Eye Contact:Good ? ?Speech:Clear and Coherent ? ?Speech Volume:Decreased ? ?Handedness:Right ? ? ?Mood and Affect  ?Mood:-- ("good") ? ?Duration of Depression Symptoms: Greater than two weeks ? ?Affect:Blunt ? ? ?Thought Process  ?Thought Processes:Linear ? ?Descriptions of Associations:Circumstantial ? ?Orientation:Full (Time, Place and Person) ? ?Thought Content:Logical ? ?History of Schizophrenia/Schizoaffective disorder:No ? ?Duration of Psychotic Symptoms:Greater than six months ? ?Hallucinations:Hallucinations: None ? ?Ideas of Reference:None ? ?Suicidal Thoughts:Suicidal Thoughts: No ? ?Homicidal Thoughts:Homicidal Thoughts: No ? ? ?Sensorium  ?Memory:Immediate Fair; Recent  Fair ? ?Judgment:-- (Improved) ? ?Insight:Shallow ? ? ?Executive Functions  ?Concentration:Fair ? ?Attention Span:Fair ? ?Recall:Fair ? ?Fund of Knowledge:Fair ? ?Language:Fair (limited) ? ? ?Psychomotor Activity  ?Psychomotor Activity:Psychomotor  Activity: Normal ? ? ?Assets  ?Assets:Resilience; Social Support ? ? ?Sleep  ?Sleep:Sleep: Good ? ? ?Physical Exam: ?Physical Exam ?Constitutional:   ?   Appearance: Normal appearance.  ?HENT:  ?   Head: Normocephalic and atraumatic.  ?Pulmonary:  ?   Effort: Pulmonary effort is normal.  ?Neurological:  ?   Mental Status: He is alert and oriented to person, place, and time.  ? ?Review of Systems  ?Psychiatric/Behavioral:  Negative for hallucinations and suicidal ideas.   ?Blood pressure 118/76, pulse (!) 110, temperature 98.5 ?F (36.9 ?C), temperature source Oral, resp. rate 19, height 5\' 10"  (1.778 m), weight 79.4 kg, SpO2 98 %. Body mass index is 25.11 kg/m?. ? ?Mental Status Per Nursing Assessment::   ?On Admission:  Self-harm behaviors, Thoughts of violence towards others ? ?Demographic Factors:  ?Male ? ?Loss Factors: ?NA ? ?Historical Factors: ?NA ? ?Risk Reduction Factors:   ?Positive social support ? ?Continued Clinical Symptoms:  ?Denies, has been known to respond to internal stimuli at baseline ? ?Cognitive Features That Contribute To Risk:  ?None   ? ?Suicide Risk:  ?Minimal: No identifiable suicidal ideation.  Patients presenting with no risk factors but with morbid ruminations; may be classified as minimal risk based on the severity of the depressive symptoms ? ? Follow-up Information   ? ? [facility] Follow up.   ?Why: [facility] intake has been completed. [facility] will follow up with you at home after discharge. ?Contact information: ?[address] ?[place] ?[phone] ? ? ?  ?  ? ? [facility] Follow up on 05/02/2021.   ?Why: You have a hospital follow up appointment for therapy and medication management services on 05/02/21 at 9:30 am.   This will be a Virtual telehealth appointment. ?Contact information: ?[address]  ?[address] ?[place] ?[phone] ? ? ?  ?  ? ?  ?  ? ?  ? ? ?Activity: as tolerated ? ?Diet: heart healthy ? ?Other: ?-Follow-up with your outpatient psychiatric provider -instructions on appointment date, time, and address (location) are provided to you in discharge paperwork. ? ?-Take your psychiatric medications as prescribed at discharge - instructions are provided to you in the discharge paperwork ? ?-Follow-up with outpatient primary care doctor and other specialists -for management of chronic medical disease, including: Elevated A1c and GERD ? ?-Testing: Follow-up with outpatient primary care provider for abnormal lab results:  ? -- Would recommend a repeat Urinalysis with outpatient provider due to small amount of blood noted, this can be seen on occasion; however would have followed up ? ?-Recommend abstinence from alcohol, tobacco, and other illicit drug use at discharge.  ? ?-If your psychiatric symptoms recur, worsen, or if you have side effects to your psychiatric medications, call your outpatient psychiatric provider, 911, [phone] or go to the nearest emergency department. ? ?-If suicidal thoughts recur, call your outpatient psychiatric provider, 911, [phone] or go to the nearest emergency department.  ? ? ?PGY-2 ?[phone], MD ?04/25/2021, 9:32 AM ?

## 2021-04-25 NOTE — Group Note (Signed)
Recreation Therapy Group Note ? ? ?Group Topic:Self-Esteem  ?Group Date: 04/25/2021 ?Start Time: 1005 ?End Time: [id number] ?Facilitators: Victorino Sparrow, LRT,CTRS ?Location: [facility] ? ? ?Goal Area(s) Addresses:  ?Patient will identify and write at least one positive trait about themself. ?Patient will acknowledge the benefit of healthy self-esteem. ?Patient will endorse understanding of ways to increase self-esteem.  ? ?Group Description:  LRT began group session with open dialogue asking the patients to define self-esteem and verbally identify positive qualities and traits people may possess. Patients were then instructed to design a personalized license plate, with words and drawings, representing positive things about themselves. Pts were encouraged to include favorites, things they are proud of, what they enjoy doing, and dreams for their future. If a patient had a life motto or a meaningful phase that expressed their life values, pt's were asked to incorporate that into their design as well. Patients were given the opportunity to share their completed work with the group. ? ? ?Affect/Mood: N/A ?  ?Participation Level: Did not attend ?  ? ?Clinical Observations/Individualized Feedback:   ? ? ?Plan: Continue to engage patient in RT group sessions 2-3x/week. ? ? ?Victorino Sparrow, LRT,CTRS ?04/25/2021 11:38 AM ?

## 2021-04-25 NOTE — Progress Notes (Signed)
Patient ID: James [name], male   DOB: 12-25-1980, 41 y.o.   MRN: [id number] ? ? ? ?Pt ambulatory, alert, and oriented X4 on and off the unit. Education, support, and encouragement provided. Discharge summary/AVS, prescriptions, medications, and follow up appointments reviewed with pt's mother/legal guardian and pt. A copy of the AVS was given to pt's mother/legal guardian. Suicide prevention resources provided. Pt's belongings in locker #11 returned and belongings sheet signed. Pt denies SI/HI, A/VH, pain, or any concerns at this time. Pt discharged to lobby to his mother/legal guardian to be transported home. ?

## 2021-04-27 ENCOUNTER — Telehealth: Payer: Self-pay | Admitting: Medical

## 2021-04-27 NOTE — Telephone Encounter (Signed)
Left message for patient to call back and schedule Medicare Annual Wellness Visit (AWV) in office.  ? ?If not able to come in office, please offer to do virtually or by telephone.  Left office number and my jabber [phone]. ? ?Last AWV:08/11/2018 ? ?Please schedule at anytime with Nurse Health Advisor. ?  ?

## 2021-04-30 ENCOUNTER — Telehealth (HOSPITAL_COMMUNITY): Payer: Self-pay | Admitting: Medical

## 2021-04-30 NOTE — BH Assessment (Signed)
Care Management - Follow Up [facility] Discharges  ? ?Patient has been placed in an inpatient psychiatric hospital ([facility]) on 04-14-2021.   ?

## 2021-06-13 ENCOUNTER — Telehealth: Payer: Self-pay | Admitting: Medical

## 2021-06-13 NOTE — Telephone Encounter (Signed)
Left message for patient to call back and schedule Medicare Annual Wellness Visit (AWV).  ? ?Please offer to do virtually or by telephone.  Left office number and my jabber [phone]. ? ?Last AWV:08/11/2018 ? ?Please schedule at anytime with Nurse Health Advisor. ?  ?

## 2021-06-26 ENCOUNTER — Ambulatory Visit: Payer: Medicare Other | Admitting: Podiatry

## 2021-06-28 ENCOUNTER — Ambulatory Visit: Payer: Medicare Other | Admitting: Medical

## 2021-07-10 ENCOUNTER — Encounter: Payer: Self-pay | Admitting: Family Medicine

## 2021-07-10 ENCOUNTER — Ambulatory Visit (INDEPENDENT_AMBULATORY_CARE_PROVIDER_SITE_OTHER): Payer: Medicare Other | Admitting: Family Medicine

## 2021-07-10 VITALS — BP 99/63 | HR 92 | Ht 70.0 in | Wt 185.2 lb

## 2021-07-10 DIAGNOSIS — R739 Hyperglycemia, unspecified: Secondary | ICD-10-CM | POA: Diagnosis not present

## 2021-07-10 LAB — COMPREHENSIVE METABOLIC PANEL
ALT: 19 U/L (ref 0–53)
AST: 14 U/L (ref 0–37)
Albumin: 4.4 g/dL (ref 3.5–5.2)
Alkaline Phosphatase: 75 U/L (ref 39–117)
BUN: 10 mg/dL (ref 6–23)
CO2: 25 mEq/L (ref 19–32)
Calcium: 9.6 mg/dL (ref 8.4–10.5)
Chloride: 108 mEq/L (ref 96–112)
Creatinine, Ser: 0.93 mg/dL (ref 0.40–1.50)
GFR: 101.43 mL/min (ref >60.00)
Glucose, Bld: 93 mg/dL (ref 70–99)
Potassium: 4.6 mEq/L (ref 3.5–5.1)
Sodium: 140 mEq/L (ref 135–145)
Total Bilirubin: 0.3 mg/dL (ref 0.2–1.2)
Total Protein: 6.9 g/dL (ref 6.0–8.3)

## 2021-07-10 LAB — HEMOGLOBIN A1C: Hgb A1c MFr Bld: 5.9 % (ref 4.6–6.5)

## 2021-07-10 NOTE — Progress Notes (Signed)
   Established Patient Office Visit  Subjective   Patient ID: James [name], male    DOB: 1980/06/15  Age: 41 y.o. MRN: [id number]  CC: A1c f/u    HPI Patient is here for 82-month pre-diabetes follow-up.   He last saw PCP on 03/30/21. At that time mom was concerned about adverse effects of metformin (renal impact). A1c was well controlled at 5.9%, so they decided to hold metformin for 3 months and recheck labs at follow-up today. Mom would like renal function rechecked today as well. He is very active, does a lot of walking. He does like sweets and sodas, but mom is trying to get him to eat a healthy diet.  Reports he is feeling good overall. No new concerns to discuss.        ROS All review of systems negative except what is listed in the HPI    Objective:     BP 99/63   Pulse 92   Ht 5\' 10"  (1.778 m)   Wt 185 lb 3.2 oz (84 kg)   BMI 26.57 kg/m    Physical Exam Vitals reviewed.  Constitutional:      Appearance: Normal appearance.  Cardiovascular:     Rate and Rhythm: Normal rate and regular rhythm.  Pulmonary:     Effort: Pulmonary effort is normal.     Breath sounds: Normal breath sounds.  Musculoskeletal:     Right lower leg: No edema.     Left lower leg: No edema.  Skin:    General: Skin is warm and dry.  Neurological:     General: No focal deficit present.     Mental Status: He is alert. Mental status is at baseline.  Psychiatric:        Mood and Affect: Mood normal.        Behavior: Behavior normal.     No results found for any visits on 07/10/21.    The 10-year ASCVD risk score (Arnett DK, et al., 2019) is: 2.3%    Assessment & Plan:   1. Elevated blood sugar No new concerns. Rechecking A1c and CMP today. Continue holding metformin until we get results. Discussed lifestyle measures including diet choices and exercise.  Patient aware of signs/symptoms requiring further/urgent evaluation.   - Hemoglobin A1c - Comprehensive metabolic  panel    Return in about 3 months (around 10/10/2021) for routine f/u PCP.    12/10/2021, NP

## 2021-07-10 NOTE — Patient Instructions (Signed)
Rechecking your A1c and renal function today. We will notify you of results and any changes to plan of care.

## 2021-07-10 NOTE — Progress Notes (Signed)
Labs look good! A1c has not worsened since stopping the metformin. Continue focusing on lifestyle management - healthy, low-carb diet, and regular physical activity.

## 2021-07-31 ENCOUNTER — Encounter: Payer: Self-pay | Admitting: Podiatry

## 2021-07-31 ENCOUNTER — Ambulatory Visit (INDEPENDENT_AMBULATORY_CARE_PROVIDER_SITE_OTHER): Payer: Medicare Other | Admitting: Podiatry

## 2021-07-31 DIAGNOSIS — Q828 Other specified congenital malformations of skin: Secondary | ICD-10-CM | POA: Diagnosis not present

## 2021-07-31 DIAGNOSIS — B351 Tinea unguium: Secondary | ICD-10-CM | POA: Diagnosis not present

## 2021-07-31 DIAGNOSIS — M79674 Pain in right toe(s): Secondary | ICD-10-CM | POA: Diagnosis not present

## 2021-07-31 DIAGNOSIS — M79675 Pain in left toe(s): Secondary | ICD-10-CM | POA: Diagnosis not present

## 2021-07-31 NOTE — Progress Notes (Signed)
This patient returns to the office for evaluation and treatment of long thick painful nails .  This patient is unable to trim his own nails since the patient cannot reach his feet.  Patient says the nails are painful walking and wearing his shoes. He also has callus under both big toes.   He presents to the office with his mother.He returns for preventive foot care services.  General Appearance  Alert, conversant and in no acute stress.  Vascular  Dorsalis pedis and posterior tibial  pulses are palpable  bilaterally.  Capillary return is within normal limits  bilaterally. Temperature is within normal limits  bilaterally.  Neurologic  Senn-Weinstein monofilament wire test within normal limits  bilaterally. Muscle power within normal limits bilaterally.  Nails Thick disfigured discolored nails with subungual debris  hallux nails bilaterally. No evidence of bacterial infection or drainage bilaterally.  Orthopedic  No limitations of motion  feet .  No crepitus or effusions noted.  No bony pathology or digital deformities noted.  Skin  normotropic skin with no porokeratosis noted bilaterally.  No signs of infections or ulcers noted.  Callus noted under hallux  B/l.  Onychomycosis  Pain in toes right foot  Pain in toes left foot  Callus hallux  B/L  Debridement  of nails  1-5  B/L with a nail nipper.  Nails were then filed using a dremel tool with no incidents.  Debride callus with # 15 blade followed by dremel tool.   RTC 4 months   Gardiner Barefoot DPM

## 2021-10-09 ENCOUNTER — Ambulatory Visit (INDEPENDENT_AMBULATORY_CARE_PROVIDER_SITE_OTHER): Payer: Medicare Other | Admitting: Medical

## 2021-10-09 VITALS — BP 106/76 | HR 100 | Resp 18 | Ht 70.0 in | Wt 189.0 lb

## 2021-10-09 DIAGNOSIS — K219 Gastro-esophageal reflux disease without esophagitis: Secondary | ICD-10-CM | POA: Diagnosis not present

## 2021-10-09 DIAGNOSIS — R5383 Other fatigue: Secondary | ICD-10-CM | POA: Diagnosis not present

## 2021-10-09 DIAGNOSIS — F17200 Nicotine dependence, unspecified, uncomplicated: Secondary | ICD-10-CM

## 2021-10-09 DIAGNOSIS — F209 Schizophrenia, unspecified: Secondary | ICD-10-CM | POA: Diagnosis not present

## 2021-10-09 DIAGNOSIS — R739 Hyperglycemia, unspecified: Secondary | ICD-10-CM | POA: Diagnosis not present

## 2021-10-09 NOTE — Patient Instructions (Addendum)
Schizophrenia. Mood is stable. Pt was on lithium and zyprexa(cogentin as needed). Sees psychiatrist and therapist.(He is in [facility] program). Let me know what injectable med he is using.   Recommend smoking cessation.  For elevated sugar get cmp and a1c. After review decide on getting back on metformin.  For mild fatigue get tsh and t4 level.  Follow up in 3-6 months or as needed. Exact date recommended after lab review.

## 2021-10-09 NOTE — Progress Notes (Signed)
Subjective:    Patient ID: James [name], male    DOB: [birth year], 41 y.o.   MRN: [id number]  HPI  Hx of elevated sugar. A1c last year good but in past mild borderline. Pt was on metformin in the past briefly. In past recent a1c was well controlled so he stopped metformin.   Pt has schizophrennia. Mood is stable. Pt was on lithium and zyprexa(cogentin as needed). Sees psychiatrist and therapist.(He is in ACT program).    Pt is a smoker. He smokes about 10 cigarettes a day.Psychiatrist may rx med to help quit smoking.  Former gerd now well controlled.     Review of Systems  Constitutional:  Negative for chills, fatigue and fever.  Respiratory:  Negative for cough, chest tightness, shortness of breath and wheezing.   Cardiovascular:  Negative for chest pain and palpitations.  Gastrointestinal:  Negative for anal bleeding.  Genitourinary:  Negative for difficulty urinating, dysuria and frequency.  Musculoskeletal:  Negative for back pain and joint swelling.  Skin:  Negative for rash.  Hematological:  Negative for adenopathy. Does not bruise/bleed easily.  Psychiatric/Behavioral:  Negative for behavioral problems. The patient is not nervous/anxious.        Pt mood is stable per mom.    Past Medical History:  Diagnosis Date   Gastroesophageal reflux disease 11/25/2017     Social History   Socioeconomic History   Marital status: Single    Spouse name: Not on file   Number of children: Not on file   Years of education: Not on file   Highest education level: Not on file  Occupational History   Not on file  Tobacco Use   Smoking status: Every Day    Packs/day: 0.50    Types: Cigarettes   Smokeless tobacco: Never  Vaping Use   Vaping Use: Never used  Substance and Sexual Activity   Alcohol use: No    Alcohol/week: 0.0 standard drinks of alcohol    Comment: rare alcohol   Drug use: No   Sexual activity: Yes    Comment: male  Other Topics Concern   Not on file   Social History Narrative   Not on file   Social Determinants of Health   Financial Resource Strain: Not on file  Food Insecurity: Not on file  Transportation Needs: Not on file  Physical Activity: Not on file  Stress: Not on file  Social Connections: Not on file  Intimate Partner Violence: Not on file    No past surgical history on file.  Family History  Problem Relation Age of Onset   Diabetes Mother    Diabetes Father    Prostate cancer Father     Allergies  Allergen Reactions   Clozapine Other (See Comments)    Reaction unknown   Latuda [Lurasidone] Other (See Comments)    Over time developed a termor in his hands.   Mango Flavor     Mango; [place]: Patient has no known reaction to report. Mom states he just don't like.    Current Outpatient Medications on File Prior to Visit  Medication Sig Dispense Refill   ARIPiprazole (ABILIFY) 15 MG tablet Take 1 tablet (15 mg total) by mouth daily. 6 tablet 0   benztropine (COGENTIN) 0.5 MG tablet Take 1 tablet (0.5 mg total) by mouth 2 (two) times daily as needed for tremors (EPS). 60 tablet 0   cholecalciferol (VITAMIN D3) 25 MCG (1000 UNIT) tablet Take 2,000 Units by mouth daily.  lithium carbonate (ESKALITH) 450 MG CR tablet Take 1 tablet (450 mg total) by mouth every 12 (twelve) hours. 60 tablet 0   metFORMIN (GLUCOPHAGE) 500 MG tablet TAKE 1 TABLET BY MOUTH TWICE DAILY WITH MEALS 180 tablet 0   Multiple Vitamins-Minerals (MULTIVITAMIN WITH MINERALS) tablet Take 1 tablet by mouth daily.     OLANZapine (ZYPREXA) 10 MG tablet Take 1 tablet (10 mg total) by mouth daily. For mood control 30 tablet 0   OLANZapine (ZYPREXA) 20 MG tablet Take 1 tablet (20 mg total) by mouth at bedtime. For mood control 30 tablet 0   No current facility-administered medications on file prior to visit.    BP 106/76   Pulse 100   Resp 18   Ht 5\' 10"  (1.778 m)   Wt 189 lb (85.7 kg)   SpO2 97%   BMI 27.12 kg/m        Objective:    Physical Exam   General- No acute distress. Pleasant patient. Neck- Full range of motion, no jvd Lungs- Clear, even and unlabored. Heart- regular rate and rhythm. Neurologic- CNII- XII grossly intact.      Assessment & Plan:   Patient Instructions  Schizophrennia. Mood is stable. Pt was on lithium and zyprexa(cogentin as needed). Sees psychiatrist and therapist.(He is in ACT program). Let me know what injectible med he is using.   Recommend smoking cessation.  For elevated sugar get cmp and a1c. After review decide on getting back on meformin.  For mild fatigue get tsh and t4 level.  Follow up in 3-6 months or as needed. Exact date recommended after lab review.      , PA-C

## 2021-10-10 LAB — HEMOGLOBIN A1C: Hgb A1c MFr Bld: 5.9 % (ref 4.6–6.5)

## 2021-10-10 LAB — T4, FREE: Free T4: 0.66 ng/dL (ref 0.60–1.60)

## 2021-10-10 LAB — COMPREHENSIVE METABOLIC PANEL
ALT: 36 U/L (ref 0–53)
AST: 20 U/L (ref 0–37)
Albumin: 4.4 g/dL (ref 3.5–5.2)
Alkaline Phosphatase: 74 U/L (ref 39–117)
BUN: 14 mg/dL (ref 6–23)
CO2: 27 mEq/L (ref 19–32)
Calcium: 9.9 mg/dL (ref 8.4–10.5)
Chloride: 107 mEq/L (ref 96–112)
Creatinine, Ser: 1.04 mg/dL (ref 0.40–1.50)
GFR: 88.54 mL/min (ref >60.00)
Glucose, Bld: 79 mg/dL (ref 70–99)
Potassium: 4.3 mEq/L (ref 3.5–5.1)
Sodium: 141 mEq/L (ref 135–145)
Total Bilirubin: 0.4 mg/dL (ref 0.2–1.2)
Total Protein: 7.1 g/dL (ref 6.0–8.3)

## 2021-10-10 LAB — TSH: TSH: 0.75 u[IU]/mL (ref 0.35–5.50)

## 2021-11-06 ENCOUNTER — Encounter: Payer: Self-pay | Admitting: Podiatry

## 2021-11-06 ENCOUNTER — Ambulatory Visit (INDEPENDENT_AMBULATORY_CARE_PROVIDER_SITE_OTHER): Payer: Medicare Other | Admitting: Podiatry

## 2021-11-06 DIAGNOSIS — Q828 Other specified congenital malformations of skin: Secondary | ICD-10-CM

## 2021-11-06 DIAGNOSIS — M79675 Pain in left toe(s): Secondary | ICD-10-CM

## 2021-11-06 DIAGNOSIS — B351 Tinea unguium: Secondary | ICD-10-CM | POA: Diagnosis not present

## 2021-11-06 DIAGNOSIS — M79674 Pain in right toe(s): Secondary | ICD-10-CM

## 2021-11-06 NOTE — Progress Notes (Signed)
This patient returns to the office for evaluation and treatment of long thick painful nails .  This patient is unable to trim his own nails since the patient cannot reach his feet.  Patient says the nails are painful walking and wearing his shoes. He also has callus under both big toes.   He presents to the office with his mother.He returns for preventive foot care services.  General Appearance  Alert, conversant and in no acute stress.  Vascular  Dorsalis pedis and posterior tibial  pulses are palpable  bilaterally.  Capillary return is within normal limits  bilaterally. Temperature is within normal limits  bilaterally.  Neurologic  Senn-Weinstein monofilament wire test within normal limits  bilaterally. Muscle power within normal limits bilaterally.  Nails Thick disfigured discolored nails with subungual debris  hallux nails bilaterally. No evidence of bacterial infection or drainage bilaterally.  Orthopedic  No limitations of motion  feet .  No crepitus or effusions noted.  No bony pathology or digital deformities noted.  Skin  normotropic skin with no porokeratosis noted bilaterally.  No signs of infections or ulcers noted.  Callus noted under hallux  B/l.  Onychomycosis  Pain in toes right foot  Pain in toes left foot  Callus hallux  B/L  Debridement  of nails  1-5  B/L with a nail nipper.  Nails were then filed using a dremel tool with no incidents.  Debride callus with # 15 blade followed by dremel tool.   RTC 4 months   Gardiner Barefoot DPM

## 2022-03-13 ENCOUNTER — Ambulatory Visit (INDEPENDENT_AMBULATORY_CARE_PROVIDER_SITE_OTHER): Payer: 59 | Admitting: Podiatry

## 2022-03-13 ENCOUNTER — Encounter: Payer: Self-pay | Admitting: Podiatry

## 2022-03-13 VITALS — BP 110/78 | HR 90

## 2022-03-13 DIAGNOSIS — Q828 Other specified congenital malformations of skin: Secondary | ICD-10-CM | POA: Diagnosis not present

## 2022-03-13 NOTE — Progress Notes (Signed)
This patient returns to the office for evaluation and treatment of his long thick callus under his big toes.  He paces  which causes these callus to develop.    He presents to the office with his mother. He says he does not need his nails treated.He returns for preventive foot care services.  General Appearance  Alert, conversant and in no acute stress.  Vascular  Dorsalis pedis and posterior tibial  pulses are palpable  bilaterally.  Capillary return is within normal limits  bilaterally. Temperature is within normal limits  bilaterally.  Neurologic  Senn-Weinstein monofilament wire test within normal limits  bilaterally. Muscle power within normal limits bilaterally.  Nails Thick disfigured discolored nails with subungual debris  from hallux to fifth toes bilaterally. No evidence of bacterial infection or drainage bilaterally.  Orthopedic  No limitations of motion  feet .  No crepitus or effusions noted.  No bony pathology or digital deformities noted.  Skin  normotropic skin with no porokeratosis noted bilaterally.  No signs of infections or ulcers noted.  Callus/porokeratosis  sub hallux  B/L  Callus hallux  B/L  Porokeratosis hallux  B/L.     Debride callus hallux  B/L. with # 15 blade.    RTC 4  months   Gardiner Barefoot DPM

## 2022-06-12 ENCOUNTER — Encounter: Payer: Self-pay | Admitting: Podiatry

## 2022-06-12 ENCOUNTER — Ambulatory Visit (INDEPENDENT_AMBULATORY_CARE_PROVIDER_SITE_OTHER): Payer: 59 | Admitting: Podiatry

## 2022-06-12 DIAGNOSIS — Q828 Other specified congenital malformations of skin: Secondary | ICD-10-CM | POA: Diagnosis not present

## 2022-06-12 DIAGNOSIS — M79675 Pain in left toe(s): Secondary | ICD-10-CM | POA: Diagnosis not present

## 2022-06-12 DIAGNOSIS — B351 Tinea unguium: Secondary | ICD-10-CM | POA: Diagnosis not present

## 2022-06-12 DIAGNOSIS — M79674 Pain in right toe(s): Secondary | ICD-10-CM

## 2022-06-12 NOTE — Progress Notes (Signed)
This patient returns to the office for evaluation and treatment of his long thick callus under his big toes.  He paces  which causes these callus to develop.    He presents to the office with his mother. He says he does not need his nails treated.He returns for preventive foot care services.  General Appearance  Alert, conversant and in no acute stress.  Vascular  Dorsalis pedis and posterior tibial  pulses are palpable  bilaterally.  Capillary return is within normal limits  bilaterally. Temperature is within normal limits  bilaterally.  Neurologic  Senn-Weinstein monofilament wire test within normal limits  bilaterally. Muscle power within normal limits bilaterally.  Nails Thick disfigured discolored nails with subungual debris  hallux nails bilaterally. No evidence of bacterial infection or drainage bilaterally.  Orthopedic  No limitations of motion  feet .  No crepitus or effusions noted.  No bony pathology or digital deformities noted.  Skin  normotropic skin with no porokeratosis noted bilaterally.  No signs of infections or ulcers noted.  Callus/porokeratosis  sub hallux  B/L  Callus hallux  B/L  Porokeratosis hallux  B/L.     Debride callus hallux  B/L. with # 15 blade.    RTC 3  months   Helane Gunther DPM

## 2022-09-13 ENCOUNTER — Encounter: Payer: Self-pay | Admitting: Podiatry

## 2022-09-13 ENCOUNTER — Ambulatory Visit (INDEPENDENT_AMBULATORY_CARE_PROVIDER_SITE_OTHER): Payer: 59 | Admitting: Podiatry

## 2022-09-13 DIAGNOSIS — M79674 Pain in right toe(s): Secondary | ICD-10-CM

## 2022-09-13 DIAGNOSIS — B351 Tinea unguium: Secondary | ICD-10-CM | POA: Diagnosis not present

## 2022-09-13 DIAGNOSIS — Q828 Other specified congenital malformations of skin: Secondary | ICD-10-CM | POA: Diagnosis not present

## 2022-09-13 DIAGNOSIS — M79675 Pain in left toe(s): Secondary | ICD-10-CM

## 2022-09-13 NOTE — Progress Notes (Signed)
This patient returns to the office for evaluation and treatment of his long thick callus under his big toes.  He paces  which causes these callus to develop.    He presents to the office with his mother. He says he does not need his nails treated.He returns for preventive foot care services.  General Appearance  Alert, conversant and in no acute stress.  Vascular  Dorsalis pedis and posterior tibial  pulses are palpable  bilaterally.  Capillary return is within normal limits  bilaterally. Temperature is within normal limits  bilaterally.  Neurologic  Senn-Weinstein monofilament wire test within normal limits  bilaterally. Muscle power within normal limits bilaterally.  Nails Thick disfigured discolored nails with subungual debris  hallux nails bilaterally. No evidence of bacterial infection or drainage bilaterally.  Orthopedic  No limitations of motion  feet .  No crepitus or effusions noted.  No bony pathology or digital deformities noted.  Skin  normotropic skin with no porokeratosis noted bilaterally.  No signs of infections or ulcers noted.  Callus/porokeratosis  sub hallux  B/L  Callus hallux  B/L  Porokeratosis hallux  B/L.     Debride callus hallux  B/L. with # 15 blade.    RTC 3  months   Helane Gunther DPM

## 2022-10-14 ENCOUNTER — Telehealth: Payer: Self-pay | Admitting: Medical

## 2022-10-14 ENCOUNTER — Ambulatory Visit (INDEPENDENT_AMBULATORY_CARE_PROVIDER_SITE_OTHER): Payer: 59 | Admitting: Medical

## 2022-10-14 VITALS — BP 130/80 | HR 74 | Resp 18 | Ht 70.0 in | Wt 178.2 lb

## 2022-10-14 DIAGNOSIS — Z5181 Encounter for therapeutic drug level monitoring: Secondary | ICD-10-CM

## 2022-10-14 DIAGNOSIS — F200 Paranoid schizophrenia: Secondary | ICD-10-CM | POA: Diagnosis not present

## 2022-10-14 DIAGNOSIS — Z0000 Encounter for general adult medical examination without abnormal findings: Secondary | ICD-10-CM

## 2022-10-14 DIAGNOSIS — F209 Schizophrenia, unspecified: Secondary | ICD-10-CM | POA: Diagnosis not present

## 2022-10-14 DIAGNOSIS — R739 Hyperglycemia, unspecified: Secondary | ICD-10-CM

## 2022-10-14 DIAGNOSIS — R5383 Other fatigue: Secondary | ICD-10-CM

## 2022-10-14 DIAGNOSIS — Z79899 Other long term (current) drug therapy: Secondary | ICD-10-CM | POA: Diagnosis not present

## 2022-10-14 NOTE — Patient Instructions (Addendum)
For you wellness exam today I have ordered cbc, cmp, A1c and  lipid panel.  Declines flu vaccine today  Recommend exercise and healthy diet.  We will let you know lab results as they come in.  Follow up date appointment will be determined after lab review.    1. Schizophrenia, unspecified type (HCC) - AMB Referral to [facility] Coordin[facility] (ACO Patients)  2. Paranoid schizophrenia (HCC) - Ambulatory referral to Psychiatry  Checking with our office nurse manager and supervising MD if we can give abilify injection pending getting in with new psychiatrist.  Would ask you also call [facility] tomorrow or Wednesday to see if he can get in there as he was formerly seen by [name] 3 years ago.

## 2022-10-14 NOTE — Telephone Encounter (Signed)
Question on med that pt may need injection monthly until he can see new psychiatrist. Abilify 400 mg extended release. For pt with schizophrenia. Can we give this injection on monthly basis? In process of finding him new psychiatrist as he was dropped abruptly recently due to insurance issues.

## 2022-10-14 NOTE — Progress Notes (Signed)
Subjective:    Patient ID: TAYRON MICCO, male    DOB: [birth year], 42 y.o.   MRN: [id number]  HPI  Pt in for wellness exam. Pt at breakfast at 7:30 am approximate. Did not eat lunch 2:25 time of interview/exam.  Pt walks a lot around house and to store. Pt not working. Mom states wants him to do some work. Pt has schizophrenia. 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  Pt has schizophrennia. Mood is stable. Pt was on lithium and zyprexa(cogentin as needed). Has seen psychiatrist and therapist.(He was seeing ACT team in the past). Mom update me recent under [employer]. Pt states [employer] is evaluating him not fully aware of his financial situation. Apparently issue with his net worth since a car was under his name/on title. Per pt he no longer qualifes under [employer]. Pt get abilfy injection every month. He needs new psychiatrist.   Abby at [phone])gave pt list of 6 placed that might be able to give the injections.   Pt was also part on [facility].     Review of Systems  Constitutional:  Negative for chills, fatigue and fever.  HENT:  Negative for congestion and dental problem.   Respiratory:  Negative for cough, chest tightness and wheezing.   Cardiovascular:  Negative for chest pain and palpitations.  Gastrointestinal:  Negative for abdominal pain, anal bleeding, blood in stool and nausea.  Genitourinary:  Negative for enuresis.  Musculoskeletal:  Negative for back pain, joint swelling and neck pain.  Skin:  Negative for rash.  Neurological:  Negative for dizziness, seizures, weakness and light-headedness.  Hematological:  Negative for adenopathy. Does not bruise/bleed easily.  Psychiatric/Behavioral:  Negative for behavioral problems, dysphoric mood, self-injury and suicidal ideas. The patient is not hyperactive.        No auditory or visual hallucinatons.     Past Medical History:  Diagnosis Date   Gastroesophageal reflux disease  11/25/2017     Social History   Socioeconomic History   Marital status: Single    Spouse name: Not on file   Number of children: Not on file   Years of education: Not on file   Highest education level: Not on file  Occupational History   Not on file  Tobacco Use   Smoking status: Every Day    Current packs/day: 0.50    Types: Cigarettes   Smokeless tobacco: Never  Vaping Use   Vaping status: Never Used  Substance and Sexual Activity   Alcohol use: No    Alcohol/week: 0.0 standard drinks of alcohol    Comment: rare alcohol   Drug use: No   Sexual activity: Yes    Comment: male  Other Topics Concern   Not on file  Social History Narrative   Not on file   Social Determinants of Health   Financial Resource Strain: Not on file  Food Insecurity: Not on file  Transportation Needs: Not on file  Physical Activity: Not on file  Stress: Not on file  Social Connections: Not on file  Intimate Partner Violence: Not on file    No past surgical history on file.  Family History  Problem Relation Age of Onset   Diabetes Mother    Diabetes Father    Prostate cancer Father     Allergies  Allergen Reactions   Clozapine Other (See Comments)    Reaction unknown   Latuda [Lurasidone] Other (See Comments)    Over time developed a  termor in his hands.   Mango Flavor     Mango; [place]: Patient has no known reaction to report. Mom states he just don't like.    Current Outpatient Medications on File Prior to Visit  Medication Sig Dispense Refill   ARIPiprazole (ABILIFY) 15 MG tablet Take 1 tablet (15 mg total) by mouth daily. 6 tablet 0   benztropine (COGENTIN) 0.5 MG tablet Take 1 tablet (0.5 mg total) by mouth 2 (two) times daily as needed for tremors (EPS). 60 tablet 0   cholecalciferol (VITAMIN D3) 25 MCG (1000 UNIT) tablet Take 2,000 Units by mouth daily.     lithium carbonate (ESKALITH) 450 MG CR tablet Take 1 tablet (450 mg total) by mouth every 12 (twelve) hours. 60  tablet 0   metFORMIN (GLUCOPHAGE) 500 MG tablet TAKE 1 TABLET BY MOUTH TWICE DAILY WITH MEALS 180 tablet 0   Multiple Vitamins-Minerals (MULTIVITAMIN WITH MINERALS) tablet Take 1 tablet by mouth daily.     OLANZapine (ZYPREXA) 10 MG tablet Take 1 tablet (10 mg total) by mouth daily. For mood control 30 tablet 0   OLANZapine (ZYPREXA) 20 MG tablet Take 1 tablet (20 mg total) by mouth at bedtime. For mood control 30 tablet 0   No current facility-administered medications on file prior to visit.    BP 130/80   Pulse 74   Resp 18   Ht 5\' 10"  (1.778 m)   Wt 178 lb 3.2 oz (80.8 kg)   SpO2 96%   BMI 25.57 kg/m       Objective:   Physical Exam   General Mental Status- Alert. General Appearance- Not in acute distress.   Skin General: Color- Normal Color. Moisture- Normal Moisture.  Neck Carotid Arteries- Normal color. Moisture- Normal Moisture. No carotid bruits. No JVD.  Chest and Lung Exam Auscultation: Breath Sounds:-Normal.  Cardiovascular Auscultation:Rythm- Regular. Murmurs & Other Heart Sounds:Auscultation of the heart reveals- No Murmurs.  Abdomen Inspection:-Inspeection Normal. Palpation/Percussion:Note:No mass. Palpation and Percussion of the abdomen reveal- Non Tender, Non Distended + BS, no rebound or guarding.   Neurologic Cranial Nerve exam:- CN III-XII intact(No nystagmus), symmetric smile. Drift Test:- No drift. Romberg Exam:- Negative.  Heal to Toe Gait exam:-Normal. Finger to Nose:- Normal/Intact Strength:- 5/5 equal and symmetric strength both upper and lower extremities.      Assessment & Plan:   Patient Instructions  For you wellness exam today I have ordered cbc, cmp, A1c and  lipid panel.  Declines flu vaccine today  Recommend exercise and healthy diet.  We will let you know lab results as they come in.  Follow up date appointment will be determined after lab review.    1. Schizophrenia, unspecified type (HCC) - AMB Referral to  [facility] Coordinaton (ACO Patients)  2. Paranoid schizophrenia (HCC) - Ambulatory referral to Psychiatry  Checking with our office nurse manager and supervising MD if we can give abilify injection pending getting in with new psychiatrist.  Would ask you also call [facility] tomorrow or Wednesday to see if he can get in there as he was formerly seen by [name] 3 years ago.         Esperanza Richters, [place]   [id number] charge as did address scenario of schizophrenia and needing new psychiatrist. Placed urgent referral and send referral to care coordination to get help in this matter. Also following tsh and t4 due to use of lithium. Checking on if we can give ability injection pending psychiatry referral. Explained plan to mom going  forward.

## 2022-10-15 ENCOUNTER — Telehealth: Payer: Self-pay

## 2022-10-15 LAB — CBC WITH DIFFERENTIAL/PLATELET
Basophils Absolute: 0.1 10*3/uL (ref 0.0–0.1)
Basophils Relative: 1 % (ref 0.0–3.0)
Eosinophils Absolute: 0.2 10*3/uL (ref 0.0–0.7)
Eosinophils Relative: 3.4 % (ref 0.0–5.0)
HCT: 46.5 % (ref 39.0–52.0)
Hemoglobin: 14.9 g/dL (ref 13.0–17.0)
Lymphocytes Relative: 39.4 % (ref 12.0–46.0)
Lymphs Abs: 2.2 10*3/uL (ref 0.7–4.0)
MCHC: 32 g/dL (ref 30.0–36.0)
MCV: 90.1 fl (ref 78.0–100.0)
Monocytes Absolute: 0.5 10*3/uL (ref 0.1–1.0)
Monocytes Relative: 9 % (ref 3.0–12.0)
Neutro Abs: 2.7 10*3/uL (ref 1.4–7.7)
Neutrophils Relative %: 47.2 % (ref 43.0–77.0)
Platelets: 216 10*3/uL (ref 150.0–400.0)
RBC: 5.16 Mil/uL (ref 4.22–5.81)
RDW: 14.7 % (ref 11.5–15.5)
WBC: 5.7 10*3/uL (ref 4.0–10.5)

## 2022-10-15 LAB — LIPID PANEL
Cholesterol: 154 mg/dL (ref 0–200)
HDL: 57.2 mg/dL (ref >39.00)
LDL Cholesterol: 83 mg/dL (ref 0–99)
NonHDL: 96.79
Total CHOL/HDL Ratio: 3
Triglycerides: 67 mg/dL (ref 0.0–149.0)
VLDL: 13.4 mg/dL (ref 0.0–40.0)

## 2022-10-15 LAB — TSH: TSH: 0.89 u[IU]/mL (ref 0.35–5.50)

## 2022-10-15 LAB — COMPREHENSIVE METABOLIC PANEL
ALT: 36 U/L (ref 0–53)
AST: 20 U/L (ref 0–37)
Albumin: 4.5 g/dL (ref 3.5–5.2)
Alkaline Phosphatase: 69 U/L (ref 39–117)
BUN: 11 mg/dL (ref 6–23)
CO2: 29 meq/L (ref 19–32)
Calcium: 10 mg/dL (ref 8.4–10.5)
Chloride: 107 meq/L (ref 96–112)
Creatinine, Ser: 0.93 mg/dL (ref 0.40–1.50)
GFR: 100.54 mL/min (ref >60.00)
Glucose, Bld: 83 mg/dL (ref 70–99)
Potassium: 4.1 meq/L (ref 3.5–5.1)
Sodium: 143 meq/L (ref 135–145)
Total Bilirubin: 0.4 mg/dL (ref 0.2–1.2)
Total Protein: 7.2 g/dL (ref 6.0–8.3)

## 2022-10-15 LAB — T4, FREE: Free T4: 0.7 ng/dL (ref 0.60–1.60)

## 2022-10-15 LAB — HEMOGLOBIN A1C: Hgb A1c MFr Bld: 5.9 % (ref 4.6–6.5)

## 2022-10-15 MED ORDER — ARIPIPRAZOLE 10 MG PO TABS
ORAL_TABLET | ORAL | 3 refills | Status: DC
Start: 2022-10-15 — End: 2022-10-22

## 2022-10-15 NOTE — Addendum Note (Signed)
Addended by: Gwenevere Abbot on: 10/15/2022 01:03 PM   Modules accepted: Orders

## 2022-10-15 NOTE — Progress Notes (Signed)
   Care Guide Note  10/15/2022 Name: EGOR KERSHNER MRN: [id number] DOB: 1980-08-22  Referred by: Esperanza Richters, PA-C Reason for referral : Care Coordination (Outreach to schedule with Pharm d )   James [name] is a 42 y.o. year old male who is a primary care patient of Saguier, [place], [place]. [name] was referred to the pharmacist for assistance related to Schizophrenia .    Successful contact was made with the patient to discuss pharmacy services including being ready for the pharmacist to call at least 5 minutes before the scheduled appointment time, to have medication bottles and any blood sugar or blood pressure readings ready for review. The patient agreed to meet with the pharmacist via with the pharmacist via video visit on (date/time).  10/16/2022  Penne Lash, RMA Care Guide [facility]  [place], [place] [address] Direct Dial: [phone] [email or web address]

## 2022-10-16 ENCOUNTER — Ambulatory Visit (INDEPENDENT_AMBULATORY_CARE_PROVIDER_SITE_OTHER): Payer: 59 | Admitting: Pharmacist

## 2022-10-16 DIAGNOSIS — F209 Schizophrenia, unspecified: Secondary | ICD-10-CM

## 2022-10-16 NOTE — Progress Notes (Signed)
   10/16/2022 Name: James [name] MRN: [id number] DOB: 01/03/1981  Chief Complaint  Patient presents with   Medication Management    James [name] is a 42 y.o. year old male who presented for a telephone visit.   They were referred to the pharmacist by their PCP for assistance in managing medication access.    Subjective:  Patient was seen by Esperanza Richters, PAC in our office last week. From his notes and talking with patient's care giver today, there has been change in his Medicaid coverage that has lead to the patient not being able to see is usually psychiatry office. Patient usually received IM Abilify 400mg  monthly but he missed his last dose due to provider change. James [name] has sent in Rx for aripiprazole / Abilify 10mg  twice a day to replace monthly IM dose after consulting with psychiatry colleague.   James [name] has tried to contact his case worker with Medicaid to try to settle the financial misunderstanding about patient's past car ownership and to try to find out more about which psychiatry services he can use. She is awaiting a call back but plans to try to call again today.  Additionally patient was getting his medications in adherence packaging from [facility] but with recent coverage change he is no longer able to get free delivery from them.  All his medications have been moved to [place] on [address]   Per his mother / caregiver James [name] has all his medication except aripiprazole currently in adherence packaging which will last until around 11/02/2022   Medication Access/Adherence  Current Pharmacy:  [facility] [address] ([address]), [place] - [address] [place] ([place]) [place] [address] Phone: [phone] Fax: [phone]   Patient reports affordability concerns with their medications: No  Patient reports access/transportation concerns to their pharmacy: Yes  Patient reports adherence concerns with their  medications:  Yes     BP Readings from Last 3 Encounters:  10/14/22 130/80  03/13/22 110/78  10/09/21 106/76    Objective:  Lab Results  Component Value Date   HGBA1C 5.9 10/14/2022    Lab Results  Component Value Date   CREATININE 0.93 10/14/2022   BUN 11 10/14/2022   NA 143 10/14/2022   K 4.1 10/14/2022   CL 107 10/14/2022   CO2 29 10/14/2022    Lab Results  Component Value Date   CHOL 154 10/14/2022   HDL 57.20 10/14/2022   LDLCALC 83 10/14/2022   TRIG 67.0 10/14/2022   CHOLHDL 3 10/14/2022    Medications Reviewed Today   Medications were not reviewed in this encounter       Assessment/Plan:   Medication Management: Currently strategy insufficient to maintain appropriate adherence to prescribed medication regimen - Discussed adherence packaging options. James [name] was considering [facility] adherence packaging but has decided that she would prefer not have medications mailed and will pick up meds at [facility].  - Called [facility] to request fill for Ablify / aripiprazole.  - Suggested use of weekly pill box to organize medications  - Provided phone number for [facility] Health contact for assistance with Medicaid [phone] in case James [name] is unable to reach James [name]' case worker.     Follow Up Plan: 1 week to check on meds and see if Mrs. Carnegie has been able to contact Medicaid case worker.   James [name], PharmD Clinical Pharmacist [facility]

## 2022-10-18 ENCOUNTER — Telehealth: Payer: Self-pay | Admitting: Medical

## 2022-10-18 NOTE — Telephone Encounter (Signed)
[facility] pharmacy called stating that pts insurance will not cover the Abilify twice doily and they will only cover one per day. Please Advise.

## 2022-10-18 NOTE — Telephone Encounter (Signed)
Are you okay with the pt taking Once daily?

## 2022-10-22 MED ORDER — ARIPIPRAZOLE 10 MG PO TABS: ORAL_TABLET | ORAL | 0 refills | Status: DC

## 2022-10-22 NOTE — Telephone Encounter (Signed)
Spoke with [facility] , BID retail price will be $600, if its okay for one tablet daily [facility] will need a new [facility]

## 2022-10-22 NOTE — Addendum Note (Signed)
Addended by: Gwenevere Abbot on: 10/22/2022 09:45 PM   Modules accepted: Orders

## 2022-10-23 ENCOUNTER — Other Ambulatory Visit (HOSPITAL_BASED_OUTPATIENT_CLINIC_OR_DEPARTMENT_OTHER): Payer: Self-pay

## 2022-10-23 ENCOUNTER — Ambulatory Visit (INDEPENDENT_AMBULATORY_CARE_PROVIDER_SITE_OTHER): Payer: 59 | Admitting: Pharmacist

## 2022-10-23 DIAGNOSIS — F209 Schizophrenia, unspecified: Secondary | ICD-10-CM

## 2022-10-23 NOTE — Progress Notes (Signed)
10/23/2022 Name: James [name] MRN: [id number] DOB: 06-23-1980  No chief complaint on file.   James [name] is a 42 y.o. year old male who presented for a telephone visit.   They were referred to the pharmacist by their PCP for assistance in managing medication access.    Subjective:  Patient was seen by James [name], PAC in our office 2 weeks ago. From his notes and after talking with patient's care giver (mother James [name]), there had been a change in his Medicaid coverage that has lead to the patient not being able to see his usual psychiatry office. Patient usually received IM Abilify 400mg  monthly but he missed his last dose due to provider change. James [name] has sent in Rx for aripiprazole / Abilify 10mg  twice a day to replace monthly IM dose after consulting with psychiatry colleague. Rx was sent to [facility] 10/16/2022 but was not filled until today 10/23/2022.   James [name] was able to contact his case worker with Medicaid this last week. James [name] was told that they could not provide her with specifics because she was not listed as his guardian. She was told that his has been assigned a guardian by the [employer] [place].  James [name] was told that James [name] did still have Medicaid coverage.  James [name] would like to return to [facility] for psychiatry services. James [name] also expressed that she would like to see if there are any services or programs that patient can enroll in that would "keep him busy and active"   Additionally patient was getting his medications in adherence packaging from [facility] but with recent coverage change he is no longer able to get free delivery from them.  All his medications have been moved to [place] on [address]   Per his mother / caregiver James [name] has all his medication except aripiprazole currently in adherence packaging which will last until around 11/02/2022   Medication  Access/Adherence  Current Pharmacy:  [facility] [address] ([address]), [place] - [address] [place] ([place]) [place] [address] Phone: [phone] Fax: [phone]   Patient reports affordability concerns with their medications: No  Patient reports access/transportation concerns to their pharmacy: Yes  Patient reports adherence concerns with their medications:  Yes     BP Readings from Last 3 Encounters:  10/14/22 130/80  03/13/22 110/78  10/09/21 106/76    Objective:  Lab Results  Component Value Date   HGBA1C 5.9 10/14/2022    Lab Results  Component Value Date   CREATININE 0.93 10/14/2022   BUN 11 10/14/2022   NA 143 10/14/2022   K 4.1 10/14/2022   CL 107 10/14/2022   CO2 29 10/14/2022    Lab Results  Component Value Date   CHOL 154 10/14/2022   HDL 57.20 10/14/2022   LDLCALC 83 10/14/2022   TRIG 67.0 10/14/2022   CHOLHDL 3 10/14/2022    Medications Reviewed Today   Medications were not reviewed in this encounter       Assessment/Plan:   Medication Management: Currently strategy has improved adherence to prescribed medication regimen - Discussed adherence packaging options at last visit. Mrs. Manella was considering adherence packaging but has decided that she would prefer not have medications mailed and will pick up meds at [facility].  - Verified with [facility] patients Ablify / aripiprazole was covered by his [facility] / medicaid plan - cost is $0  SODH:  - referral  place to request assistance identifying community resources for possible programs to keep patient engaged and active.  - Encouraged Mrs. Kozar to reach out to [facility] Psychiatry to see if they would be able to see patient again.    Follow Up Plan: not further outreach by Clinical Pharmacist Practitioner needed at this time .  James [name], PharmD Clinical Pharmacist [facility]

## 2022-10-23 NOTE — Telephone Encounter (Signed)
Downstairs unable to process refill because its already at another location

## 2022-10-24 ENCOUNTER — Telehealth: Payer: Self-pay | Admitting: *Deleted

## 2022-10-24 NOTE — Progress Notes (Signed)
Care Coordination  Outreach Note  10/24/2022 Name: CLEOTHA ARRIAZA MRN: [id number] DOB: April 26, 1980   Care Coordination Outreach Attempts: An unsuccessful telephone outreach was attempted today to offer the patient information about available care coordination services.  Follow Up Plan:  Additional outreach attempts will be made to offer the patient care coordination information and services.   Encounter Outcome:  No Answer  Burman Nieves, CCMA Care Coordination Care Guide Direct Dial: [phone]

## 2022-10-25 ENCOUNTER — Other Ambulatory Visit (HOSPITAL_BASED_OUTPATIENT_CLINIC_OR_DEPARTMENT_OTHER): Payer: Self-pay

## 2022-10-25 MED ORDER — ARIPIPRAZOLE 10 MG PO TABS
10.0000 mg | ORAL_TABLET | Freq: Every day | ORAL | 0 refills | Status: DC
Start: 2022-10-25 — End: 2022-11-27
  Filled 2022-10-25: qty 30, 30d supply, fill #0

## 2022-10-25 NOTE — Addendum Note (Signed)
Addended by: Gwenevere Abbot on: 10/25/2022 05:03 PM   Modules accepted: Orders

## 2022-10-25 NOTE — Progress Notes (Signed)
Care Coordination   Note   10/25/2022 Name: James [name] MRN: [id number] DOB: [birth year]  James [name] is a 42 y.o. year old male who sees Saguier, Audie, [place] for primary care. I reached out to [name] by phone today to offer care coordination services.  [name] was given information about Care Coordination services today including:   The Care Coordination services include support from the care team which includes your Nurse Coordinator, Clinical Social Worker, or Pharmacist.  The Care Coordination team is here to help remove barriers to the health concerns and goals most important to you. Care Coordination services are voluntary, and the patient may decline or stop services at any time by request to their care team member.   Care Coordination Consent Status: Patient agreed to services and verbal consent obtained.   Follow up plan:  Telephone appointment with care coordination team member scheduled for:  10/29/2022  Encounter Outcome:  Patient Scheduled from referral   Burman Nieves, [facility] Care Coordination Care Guide Direct Dial: [phone]

## 2022-10-28 ENCOUNTER — Other Ambulatory Visit (HOSPITAL_BASED_OUTPATIENT_CLINIC_OR_DEPARTMENT_OTHER): Payer: Self-pay

## 2022-10-29 ENCOUNTER — Ambulatory Visit: Payer: Self-pay | Admitting: Licensed Clinical Social Worker

## 2022-10-30 NOTE — Patient Instructions (Signed)
Visit Information  Thank you for taking time to visit with me today. Please don't hesitate to contact me if I can be of assistance to you.   Following are the goals we discussed today:   Goals Addressed             This Visit's Progress    Obtain Supportive Resources   On track    Activities and task to complete in order to accomplish goals.   Keep all upcoming appointments discussed today Continue with compliance of taking medication prescribed by Doctor Implement healthy coping skills discussed to assist with management of symptoms         Our next appointment is by telephone on 10/09 at 1 PM  Please call the care guide team at [phone] if you need to cancel or reschedule your appointment.   If you are experiencing a Mental Health or Behavioral Health Crisis or need someone to talk to, please call the Suicide and Crisis Lifeline: 988 call 911   The patient verbalized understanding of instructions, educational materials, and care plan provided today and DECLINED offer to receive copy of patient instructions, educational materials, and care plan.   Jenel Lucks, MSW, LCSW [facility] Care Management [place]  [email or web address] Phone [phone] 5:58 PM

## 2022-10-30 NOTE — Patient Outreach (Signed)
Care Coordination   Initial Visit Note   10/29/2022 Name: James [name] MRN: [id number] DOB: [birth year]  James [name] is a 42 y.o. year old male who sees Saguier, [place], [place] for primary care. I spoke with  [name] mother, [name], by phone today.  What matters to the patients health and wellness today?  Supportive Resources    Goals Addressed             This Visit's Progress    Obtain Supportive Resources   On track    Activities and task to complete in order to accomplish goals.   Keep all upcoming appointments discussed today Continue with compliance of taking medication prescribed by Doctor Implement healthy coping skills discussed to assist with management of symptoms         SDOH assessments and interventions completed:  Yes  SDOH Interventions Today    Flowsheet Row Most Recent Value  SDOH Interventions   Food Insecurity Interventions Intervention Not Indicated  Housing Interventions Intervention Not Indicated  Transportation Interventions Intervention Not Indicated        Care Coordination Interventions:  Yes, provided  Interventions Today    Flowsheet Row Most Recent Value  Chronic Disease   Chronic disease during today's visit Other  [Schizophrenia]  General Interventions   General Interventions Discussed/Reviewed General Interventions Discussed, Community Resources, Doctor Visits, Level of Care  Doctor Visits Discussed/Reviewed Doctor Visits Discussed  Level of Care --  [Discussed pt's recent discharge from [facility] services after switched to [facility]. Discussed supportive resources with patient's mother]  Mental Health Interventions   Mental Health Discussed/Reviewed Mental Health Discussed, Coping Strategies  [Caregiver Strain identified. Strategies to assist with stress management discussed. Validation and encouragement provided, in addition to supportive community resources]  Nutrition Interventions   Nutrition Discussed/Reviewed  Nutrition Discussed  Pharmacy Interventions   Pharmacy Dicussed/Reviewed Pharmacy Topics Discussed, Medication Adherence  [Patient's mother is in the process of re-establishing pt with [facility] for med management.]  Safety Interventions   Safety Discussed/Reviewed Safety Discussed       Follow up plan: Follow up call scheduled for 2-4 weeks    Encounter Outcome:  Patient Visit Completed   Jenel Lucks, MSW, LCSW [facility] Care Management [facility] Health  [facility] [email or web address] Phone [phone] 5:57 PM

## 2022-11-02 ENCOUNTER — Telehealth: Payer: Self-pay

## 2022-11-02 NOTE — Telephone Encounter (Signed)
Parent of patient (Mother) with patient requesting refills of the following:  Lithium, Benztropine Mesylate, Olanzapine (2 doses).  Reviewed the following:  LITHIUM CARBONATE ER  450 MG TBCR  Sig: SMARTSIG:1 Tablet(s) By Mouth Twice Daily  Dispensed: 10/29/2022 12:00 AM  Days supply: 30  Dispense Note: [NO ORIGINAL SIG]  Quantity: 60 tablet  Pharmacy: [facility] - [place], [place] - [address] [facility] [address] [place] [address] Phone: [phone] Fax: [phone]  Authorizing provider: Charlynn Grimes, MD [address] [place] [address] Phone: [phone] Fax: [phone]  Received from: [facility] (Claim History, Ambulatory)  Brand or Generic: Generic    BENZTROPINE MESYLATE  0.5 MG TABS  Sig: SMARTSIG:Tablet(s) By Mouth  Dispensed: 10/29/2022 12:00 AM  Days supply: 30  Dispense Note: [NO ORIGINAL SIG]  Quantity: 60 tablet  Pharmacy: [facility] - [place], [place] - [address] [facility] [address] [place] [address] Phone: [phone] Fax: [phone]  Authorizing provider: Charlynn Grimes, MD [address] [place] [address] Phone: [phone] Fax: [phone]  Received from: [facility] (Claim History, Ambulatory)  Brand or Generic: Generic   OLANZapine (ZYPREXA) tablet 2.5 mg   [[id number]]  Ordered Dose: 2.5 mg Route: Oral Frequency: NOW  Admin Dose: 2.5 mg     Scheduled Start Date/Time: 11/27/19 1130 End Date/Time: 11/27/19 1147 after 1 doses     Order Status: Completed Sat Nov 27, 2019 1147, originally scheduled to end Sun Nov 28, 2019 1130    Ordering User: Antonieta Pert, MD Ordering Date/Time: Sat Nov 27, 2019 1127  Ordering Provider: Antonieta Pert, MD Authorizing Provider: Antonieta Pert, MD   Discussed with both parties to contact:  [facility] on call service regarding medications.  Mother states this isn't and emergency  so will call office on Monday. Staff message sent to office on behalf of patient/parent (mother).  B. Roten CMA

## 2022-11-04 ENCOUNTER — Telehealth: Payer: Self-pay | Admitting: Medical

## 2022-11-04 ENCOUNTER — Telehealth: Payer: Self-pay | Admitting: Pharmacist

## 2022-11-04 MED ORDER — BENZTROPINE MESYLATE 0.5 MG PO TABS
0.5000 mg | ORAL_TABLET | Freq: Two times a day (BID) | ORAL | 3 refills | Status: DC | PRN
  Filled 2022-11-04: qty 60, 30d supply, fill #0

## 2022-11-04 MED ORDER — LITHIUM CARBONATE ER 450 MG PO TBCR
450.0000 mg | EXTENDED_RELEASE_TABLET | Freq: Two times a day (BID) | ORAL | 3 refills | Status: DC
  Filled 2022-11-04: qty 60, 30d supply, fill #0

## 2022-11-04 NOTE — Telephone Encounter (Signed)
Forwarding request to PCP

## 2022-11-04 NOTE — Telephone Encounter (Signed)
error 

## 2022-11-04 NOTE — Addendum Note (Signed)
Addended by: Gwenevere Abbot on: 11/04/2022 09:19 PM   Modules accepted: Orders

## 2022-11-04 NOTE — Telephone Encounter (Signed)
-----   Message from [facility] sent at 11/02/2022  3:25 PM EDT ----- Regarding: Rx(s) Dawayne Cirri mother stopped by our Urgent Care for Rx refills (see telephone call). Can you contact her on Monday to assist.  Ty.  Lucille Passy CMA [facility] Urgent [facility]  [facility] (Mother) [phone]

## 2022-11-05 ENCOUNTER — Other Ambulatory Visit (HOSPITAL_BASED_OUTPATIENT_CLINIC_OR_DEPARTMENT_OTHER): Payer: Self-pay

## 2022-11-05 NOTE — Telephone Encounter (Signed)
Pt has an upcoming appt with PCP on 11/06/22

## 2022-11-06 ENCOUNTER — Ambulatory Visit: Payer: 59 | Admitting: Medical

## 2022-11-13 ENCOUNTER — Ambulatory Visit: Payer: Self-pay | Admitting: Licensed Clinical Social Worker

## 2022-11-15 NOTE — Patient Instructions (Signed)
Visit Information  Thank you for taking time to visit with me today. Please don't hesitate to contact me if I can be of assistance to you.   Following are the goals we discussed today:   Goals Addressed             This Visit's Progress    Obtain Supportive Resources   On track    Activities and task to complete in order to accomplish goals.   Keep all upcoming appointments discussed today Continue with compliance of taking medication prescribed by Doctor Implement healthy coping skills discussed to assist with management of symptoms         Our next appointment is by telephone on 10/31 at 1 PM  Please call the care guide team at [phone] if you need to cancel or reschedule your appointment.   If you are experiencing a Mental Health or Behavioral Health Crisis or need someone to talk to, please call the Suicide and Crisis Lifeline: 988 call 911   The patient verbalized understanding of instructions, educational materials, and care plan provided today and DECLINED offer to receive copy of patient instructions, educational materials, and care plan.   Jenel Lucks, MSW, LCSW [facility] Care Management [place]  [facility] [email or web address] Phone [phone] 5:18 AM

## 2022-11-15 NOTE — Patient Outreach (Signed)
  Care Coordination   Follow Up Visit Note   11/13/2022 Name: KENDEN BRANDT MRN: [id number] DOB: 03-21-1980  KAI CALICO is a 42 y.o. year old male who sees Saguier, Geraldo, [place] for primary care. I spoke with  [name] mother by phone today.  What matters to the patients health and wellness today?  Symptom Management    Goals Addressed             This Visit's Progress    Obtain Supportive Resources   On track    Activities and task to complete in order to accomplish goals.   Keep all upcoming appointments discussed today Continue with compliance of taking medication prescribed by Doctor Implement healthy coping skills discussed to assist with management of symptoms         SDOH assessments and interventions completed:  No     Care Coordination Interventions:  Yes, provided  Interventions Today    Flowsheet Row Most Recent Value  Chronic Disease   Chronic disease during today's visit Other  [Schizophrenia]  General Interventions   General Interventions Discussed/Reviewed General Interventions Reviewed, Doctor Visits, Community Resources  Doctor Visits Discussed/Reviewed Doctor Visits Reviewed  Mental Health Interventions   Mental Health Discussed/Reviewed Mental Health Reviewed, Coping Strategies  [Pt's symptoms are managed currently. Family has a second conference with [facility] on 10/30 to advocate for pt to re-establish with previous psychiatrist]  Nutrition Interventions   Nutrition Discussed/Reviewed Nutrition Reviewed  Pharmacy Interventions   Pharmacy Dicussed/Reviewed Pharmacy Topics Reviewed, Medication Adherence  Safety Interventions   Safety Discussed/Reviewed Safety Reviewed       Follow up plan: Follow up call scheduled for 2-4 weeks    Encounter Outcome:  Patient Visit Completed   Jenel Lucks, MSW, LCSW [facility] Care Management [facility] Health  [facility] [email or web address] Phone [phone] 5:18  AM

## 2022-11-27 ENCOUNTER — Telehealth: Payer: Self-pay | Admitting: Medical

## 2022-11-27 ENCOUNTER — Other Ambulatory Visit (HOSPITAL_BASED_OUTPATIENT_CLINIC_OR_DEPARTMENT_OTHER): Payer: Self-pay

## 2022-11-27 ENCOUNTER — Other Ambulatory Visit: Payer: Self-pay | Admitting: Medical

## 2022-11-27 MED ORDER — ARIPIPRAZOLE 10 MG PO TABS
10.0000 mg | ORAL_TABLET | Freq: Two times a day (BID) | ORAL | 1 refills | Status: DC
  Filled 2022-11-27: qty 60, 30d supply, fill #0
  Filled 2022-11-28: qty 30, 15d supply, fill #0

## 2022-11-27 NOTE — Telephone Encounter (Signed)
Provide Exception Process Printed Notice  Maximum Daily Dose of 1 insurance will not pay for twice daily

## 2022-11-27 NOTE — Telephone Encounter (Signed)
Pt mother states he will be out of ARIPiprazole (ABILIFY) 10 MG tablet by Friday and is requesting a 5 day supply sent to the pharmacy downstairs. States their was a dosing issue with rx and he does not have enough to last him till his appt with the psychiatrist as he takes this rx twice daily.   ARIPiprazole (ABILIFY) 10 MG tablet   [facility] [address] [place] [address] Phone: [phone]  Fax: [phone]

## 2022-11-27 NOTE — Addendum Note (Signed)
Addended by: Gwenevere Abbot on: 11/27/2022 02:50 PM   Modules accepted: Orders

## 2022-11-28 ENCOUNTER — Other Ambulatory Visit (HOSPITAL_BASED_OUTPATIENT_CLINIC_OR_DEPARTMENT_OTHER): Payer: Self-pay

## 2022-12-05 ENCOUNTER — Ambulatory Visit: Payer: Self-pay | Admitting: Licensed Clinical Social Worker

## 2022-12-06 NOTE — Patient Outreach (Signed)
  Care Coordination   Follow Up Visit Note   12/05/2022 Name: James [name] MRN: [id number] DOB: 30-Nov-1980  James [name] is a 42 y.o. year old male who sees Saguier, [place], [place] for primary care. I spoke with  James [name] mother by phone today.  What matters to the patients health and wellness today?  Symptom Management    Goals Addressed             This Visit's Progress    Obtain Supportive Resources   On track    Activities and task to complete in order to accomplish goals.   Keep all upcoming appointments discussed today Continue with compliance of taking medication prescribed by Doctor Implement healthy coping skills discussed to assist with management of symptoms         SDOH assessments and interventions completed:  No     Care Coordination Interventions:  Yes, provided  Interventions Today    Flowsheet Row Most Recent Value  Chronic Disease   Chronic disease during today's visit Other  [Schizophrenia]  General Interventions   General Interventions Discussed/Reviewed General Interventions Reviewed, Community Resources, Doctor Visits  Doctor Visits Discussed/Reviewed Doctor Visits Reviewed  Mental Health Interventions   Mental Health Discussed/Reviewed Mental Health Reviewed, Coping Strategies  [Pt has re-established with Psychiatrist with [facility]. Family will f/up with him regarding tx plan and recommendations to assist with management of symptoms]  Pharmacy Interventions   Pharmacy Dicussed/Reviewed Pharmacy Topics Reviewed, Medication Adherence  Safety Interventions   Safety Discussed/Reviewed Safety Reviewed       Follow up plan: Follow up call scheduled for 2-4 weeks    Encounter Outcome:  Patient Visit Completed   Jenel Lucks, MSW, LCSW [facility] Care Management [facility] Health  [facility] [name] Phone [phone] 5:54 AM

## 2022-12-06 NOTE — Patient Instructions (Signed)
Visit Information  Thank you for taking time to visit with me today. Please don't hesitate to contact me if I can be of assistance to you.   Following are the goals we discussed today:   Goals Addressed             This Visit's Progress    Obtain Supportive Resources   On track    Activities and task to complete in order to accomplish goals.   Keep all upcoming appointments discussed today Continue with compliance of taking medication prescribed by Doctor Implement healthy coping skills discussed to assist with management of symptoms         Our next appointment is by telephone on 11/15 at 1 PM  Please call the care guide team at [phone] if you need to cancel or reschedule your appointment.   If you are experiencing a Mental Health or Behavioral Health Crisis or need someone to talk to, please call the Suicide and Crisis Lifeline: 988 call 911   The patient verbalized understanding of instructions, educational materials, and care plan provided today and DECLINED offer to receive copy of patient instructions, educational materials, and care plan.   Jenel Lucks, MSW, LCSW [facility] Care Management [place]  [facility] [email or web address] Phone [phone] 5:54 AM

## 2022-12-11 ENCOUNTER — Ambulatory Visit (INDEPENDENT_AMBULATORY_CARE_PROVIDER_SITE_OTHER): Payer: 59 | Admitting: Podiatry

## 2022-12-11 ENCOUNTER — Encounter: Payer: Self-pay | Admitting: Podiatry

## 2022-12-11 DIAGNOSIS — M79675 Pain in left toe(s): Secondary | ICD-10-CM

## 2022-12-11 DIAGNOSIS — Q828 Other specified congenital malformations of skin: Secondary | ICD-10-CM

## 2022-12-11 DIAGNOSIS — M79674 Pain in right toe(s): Secondary | ICD-10-CM | POA: Diagnosis not present

## 2022-12-11 DIAGNOSIS — B351 Tinea unguium: Secondary | ICD-10-CM | POA: Diagnosis not present

## 2022-12-11 NOTE — Progress Notes (Signed)
This patient returns to the office for evaluation and treatment of his long thick callus under his big toes.  He paces  which causes these callus to develop.     He says he does not need his nails treated. He returns for preventive foot care services.  General Appearance  Alert, conversant and in no acute stress.  Vascular  Dorsalis pedis and posterior tibial  pulses are palpable  bilaterally.  Capillary return is within normal limits  bilaterally. Temperature is within normal limits  bilaterally.  Neurologic  Senn-Weinstein monofilament wire test within normal limits  bilaterally. Muscle power within normal limits bilaterally.  Nails Thick disfigured discolored nails with subungual debris  hallux nails bilaterally. No evidence of bacterial infection or drainage bilaterally.  Orthopedic  No limitations of motion  feet .  No crepitus or effusions noted.  No bony pathology or digital deformities noted.  Skin  normotropic skin with no porokeratosis noted bilaterally.  No signs of infections or ulcers noted.  Callus/porokeratosis  sub hallux  B/L  Callus hallux  B/L  Porokeratosis hallux  B/L.     Debride callus hallux  B/L. with # 15 blade.  Nails were done as a courtesy.  RTC 3  months   Helane Gunther DPM

## 2022-12-20 ENCOUNTER — Ambulatory Visit: Payer: Self-pay | Admitting: Licensed Clinical Social Worker

## 2022-12-23 NOTE — Patient Outreach (Signed)
  Care Coordination   Follow Up Visit Note   12/20/2022 Name: James [name] MRN: [id number] DOB: May 30, 1980  James [name] is a 42 y.o. year old male who sees James [name], James [name], [place] for primary care. I spoke with  James [name] mother by phone today.  What matters to the patients health and wellness today?  Supportive Resources and Symptom Management    Goals Addressed             This Visit's Progress    Obtain Supportive Resources   On track    Activities and task to complete in order to accomplish goals.   Keep all upcoming appointments discussed today Continue with compliance of taking medication prescribed by Doctor Implement healthy coping skills discussed to assist with management of symptoms F/up with James [name] regarding therapy and job readiness options         SDOH assessments and interventions completed:  No     Care Coordination Interventions:  Yes, provided  Interventions Today    Flowsheet Row Most Recent Value  Chronic Disease   Chronic disease during today's visit Other  [Schizophrenia]  General Interventions   General Interventions Discussed/Reviewed General Interventions Reviewed, [employer], Doctor Visits  Doctor Visits Discussed/Reviewed Doctor Visits Reviewed  Mental Health Interventions   Mental Health Discussed/Reviewed Mental Health Reviewed, Coping Strategies       Follow up plan: Follow up call scheduled for 2-4 weeks    Encounter Outcome:  Patient Visit Completed   James [name], MSW, LCSW [facility] Care Management [facility] Health  [facility] [name] Phone [phone] 12:18 PM

## 2022-12-23 NOTE — Patient Instructions (Signed)
Visit Information  Thank you for taking time to visit with me today. Please don't hesitate to contact me if I can be of assistance to you.   Following are the goals we discussed today:   Goals Addressed             This Visit's Progress    Obtain Supportive Resources   On track    Activities and task to complete in order to accomplish goals.   Keep all upcoming appointments discussed today Continue with compliance of taking medication prescribed by Doctor Implement healthy coping skills discussed to assist with management of symptoms F/up with [name] regarding therapy and job readiness options         Our next appointment is by telephone on 12/6   Please call the care guide team at [phone] if you need to cancel or reschedule your appointment.   If you are experiencing a Mental Health or Behavioral Health Crisis or need someone to talk to, please call the Suicide and Crisis Lifeline: 988 call 911   The patient verbalized understanding of instructions, educational materials, and care plan provided today and DECLINED offer to receive copy of patient instructions, educational materials, and care plan.   Jenel Lucks, MSW, LCSW [facility] Care Management [place]  [facility] [email or web address] Phone [phone] 12:24 PM

## 2022-12-23 NOTE — Patient Outreach (Signed)
  Care Coordination   Follow Up Visit Note   12/20/2022 Name: James [name] MRN: [id number] DOB: 1980-07-05  James [name] is a 42 y.o. year old male who sees Saguier, [place], [place] for primary care. I spoke with  [name] mother by phone today.  What matters to the patients health and wellness today?  Symptom Management and Supportive Resources    Goals Addressed             This Visit's Progress    Obtain Supportive Resources   On track    Activities and task to complete in order to accomplish goals.   Keep all upcoming appointments discussed today Continue with compliance of taking medication prescribed by Doctor Implement healthy coping skills discussed to assist with management of symptoms F/up with [name] regarding therapy and job readiness options         SDOH assessments and interventions completed:  No     Care Coordination Interventions:  Yes, provided  Interventions Today    Flowsheet Row Most Recent Value  Chronic Disease   Chronic disease during today's visit Other  [Schizophrenia]  General Interventions   General Interventions Discussed/Reviewed General Interventions Reviewed, [employer], Doctor Visits  Doctor Visits Discussed/Reviewed Doctor Visits Reviewed  Mental Health Interventions   Mental Health Discussed/Reviewed Mental Health Reviewed, Coping Strategies       Follow up plan: Follow up call scheduled for 2-4 weeks    Encounter Outcome:  Patient Visit Completed   Jenel Lucks, MSW, LCSW [facility] Care Management [facility] Health  [facility] [email or web address] Phone [phone] 12:23 PM

## 2023-01-10 ENCOUNTER — Encounter: Payer: Self-pay | Admitting: Licensed Clinical Social Worker

## 2023-01-10 ENCOUNTER — Telehealth: Payer: Self-pay | Admitting: Licensed Clinical Social Worker

## 2023-01-10 NOTE — Patient Outreach (Signed)
  Care Coordination   01/10/2023 Name: James [name] MRN: [id number] DOB: 1980/07/22   Care Coordination Outreach Attempts:  An unsuccessful telephone outreach was attempted for a scheduled appointment today.  Follow Up Plan:  Additional outreach attempts will be made to offer the patient care coordination information and services.   Encounter Outcome:  No Answer   Care Coordination Interventions:  No, not indicated    Jenel Lucks, MSW, LCSW [facility] Care Management [place]  [facility] [email or web address] Phone [phone] 12:40 PM

## 2023-03-12 ENCOUNTER — Ambulatory Visit: Payer: Self-pay | Admitting: Licensed Clinical Social Worker

## 2023-03-12 NOTE — Patient Instructions (Signed)
 Visit Information  Thank you for taking time to visit with me today. Please don't hesitate to contact me if I can be of assistance to you.   Following are the goals we discussed today:   Goals Addressed   None     Our next appointment is by telephone on 2/19 at 12:30 PM  Please call the care guide team at [phone] if you need to cancel or reschedule your appointment.   If you are experiencing a Mental Health or Behavioral Health Crisis or need someone to talk to, please call the Suicide and Crisis Lifeline: 988 call 911   The patient verbalized understanding of instructions, educational materials, and care plan provided today and DECLINED offer to receive copy of patient instructions, educational materials, and care plan.   James [name] [facility] Health  [facility] Clinical Social Worker Direct Dial: [phone]  Fax: [phone] Website: [email or web address] 1:16 PM

## 2023-03-12 NOTE — Patient Outreach (Signed)
  Care Coordination   Follow Up Visit Note   03/12/2023 Name: James [name] MRN: [id number] DOB: Dec 07, 1980  James [name] is a 43 y.o. year old male who sees Saguier, Dell, PA-C for primary care. I spoke with  James [name] Sovereign mother by phone today.  What matters to the patients health and wellness today?  Pt's father recently had a stroke and is currently hospitalized. Encouragement provided to family and LCSW will f/up in a couple of weeks     SDOH assessments and interventions completed:  No     Care Coordination Interventions:  Yes, provided  Interventions Today    Flowsheet Row Most Recent Value  General Interventions   General Interventions Discussed/Reviewed General Interventions Reviewed  Mental Health Interventions   Mental Health Discussed/Reviewed Coping Strategies  [Pt's mother informed LCSW that pt's father has had a stroke and currently in hospital. Encouragement provided.]       Follow up plan: Follow up call scheduled for 2-4 weeks    Encounter Outcome:  Patient Visit Completed   Rolin Kerns, LCSW [place]  [facility], [facility] Clinical Social Worker Direct Dial: [phone]  Fax: [phone] Website: [email or web address] 1:16 PM

## 2023-03-13 ENCOUNTER — Ambulatory Visit: Payer: 59 | Admitting: Podiatry

## 2023-03-25 ENCOUNTER — Telehealth: Payer: Self-pay | Admitting: Licensed Clinical Social Worker

## 2023-03-25 NOTE — Patient Outreach (Signed)
  Care Coordination   03/25/2023 Name: James [name] MRN: [id number] DOB: Jun 29, 1980   Care Coordination Outreach Attempts:  An unsuccessful outreach was attempted to r/s for an appointment scheduled for 03/26/23  Follow Up Plan:  LCSW will collaborate with Scheduler to r/s f/up appt  Encounter Outcome:  No Answer   Care Coordination Interventions:  No, not indicated    Jenel Lucks, LCSW [place]  [facility], [facility] Clinical Social Worker Direct Dial: [phone]  Fax: [phone] Website: [email or web address] 4:21 PM

## 2023-03-26 ENCOUNTER — Encounter: Payer: Self-pay | Admitting: Licensed Clinical Social Worker

## 2023-04-03 ENCOUNTER — Telehealth: Payer: Self-pay | Admitting: *Deleted

## 2023-04-03 NOTE — Progress Notes (Unsigned)
 Complex Care Management Care Guide Note  04/03/2023 Name: James [name] MRN: [id number] DOB: [birth year]  [name] is a 43 y.o. year old male who is a primary care patient of Saguier, Karder, Goodin and is actively engaged with the care management team. I reached out to [name] by phone today to assist with re-scheduling  with the Licensed Clinical Child psychotherapist.  Follow up plan: Unsuccessful telephone outreach attempt made. A HIPAA compliant phone message was left for the patient providing contact information and requesting a return call.  Burman Nieves, CMA, Care Guide [facility]  [facility], [facility] Guide Direct Dial: [phone]  Fax: [phone] Website: [email or web address]

## 2023-04-04 ENCOUNTER — Telehealth: Payer: Self-pay

## 2023-04-04 ENCOUNTER — Encounter: Payer: Self-pay | Admitting: Licensed Clinical Social Worker

## 2023-04-04 NOTE — Telephone Encounter (Signed)
 Saguier, Romello 9:08 AM  Good morning. I am NP with palliative care at [facility]. I am caring for [name] father. Unfortunately father's prognosis is very poor. Family are trying to prepare and are asking for assistance for support to James [name] DOB Mar 30, 1980 with resources such as meals on wheels, support to help him manage meds, etc. His father was helping him most with this and his mother will need more support. I am reaching out to a social worker [place] for assistance as well (I am not sure the agency that Laurel works with yet). Daughter James [name] is at [phone] if there is any help or guidance that your office can give her. Thank you.   can you review and help me out with this. not sure how ot handle this.

## 2023-04-04 NOTE — Telephone Encounter (Signed)
 Epic Note 04/03/23 - pt has social work in place

## 2023-04-04 NOTE — Progress Notes (Signed)
 Complex Care Management Care Guide Note  04/04/2023 Name: James [name] MRN: [id number] DOB: 15-Jul-1980  James [name] is a 43 y.o. year old male who is a primary care patient of Saguier, Jasiel, [place] and is actively engaged with the care management team. I reached out to [name] by phone today to assist with re-scheduling  with the Licensed Clinical Child psychotherapist.  Follow up plan: Telephone appointment with complex care management team member scheduled for:  04/07/2023  Burman Nieves, CMA, Care Guide [facility], [facility] Guide Direct Dial: [phone]  Fax: [phone] Website: [email or web address]

## 2023-04-04 NOTE — Patient Outreach (Signed)
 Care Coordination   Collaboration  Visit Note   04/04/2023 Name: James [name] MRN: [id number] DOB: July 08, 1980  James [name] is a 43 y.o. year old male who sees James [name], [place] for primary care. I  spoke with hospital RN  What matters to the patients health and wellness today?  Pt was not engaged during this encounter. LCSW has left a detailed message for pt's sister, James [name], [phone] at 9:17 AM    SDOH assessments and interventions completed:  No     Care Coordination Interventions:  Yes, provided  Interventions Today    Flowsheet Row Most Recent Value  Chronic Disease   Chronic disease during today's visit Other  [Schizophrenia]  General Interventions   General Interventions Discussed/Reviewed Communication with  Communication with RN  [employer] collaborated with hospital Nurse. Informed LCSW that pt's father is critically ill while inpt at [facility]. Pt's sister, James [name], has requested assistance with medication management, meal assistance, and tobacco cessation. Nurse has updated PCP of pt needs]       Follow up plan:  LCSW will continue to make attempts to f/up with family    Encounter Outcome:  Patient Visit Completed \\  Jenel Lucks, LCSW [facility]  [facility], [facility] Clinical Social Worker Direct Dial: [phone]  Fax: [phone] Website: [email or web address] 9:46 AM

## 2023-04-07 ENCOUNTER — Ambulatory Visit: Payer: Self-pay | Admitting: Licensed Clinical Social Worker

## 2023-04-08 NOTE — Patient Instructions (Signed)
 Visit Information  Thank you for taking time to visit with me today. Please don't hesitate to contact me if I can be of assistance to you.   Following are the goals we discussed today:   Goals Addressed             This Visit's Progress    Obtain Supportive Resources   On track    Activities and task to complete in order to accomplish goals.   Keep all upcoming appointments discussed today Continue with compliance of taking medication prescribed by Doctor Implement healthy coping skills discussed to assist with management of symptoms F/up with [name] regarding therapy and job readiness options F/up with Medicaid regarding benefit and eligibility for [facility] and/or ACTT services         Our next appointment is by telephone on 3/31 at 3:30 PM  Please call the care guide team at [phone] if you need to cancel or reschedule your appointment.   If you are experiencing a Mental Health or Behavioral Health Crisis or need someone to talk to, please call the Suicide and Crisis Lifeline: 988 call 911   The patient verbalized understanding of instructions, educational materials, and care plan provided today and DECLINED offer to receive copy of patient instructions, educational materials, and care plan.   Windy Fast [facility]  [facility], [facility] Clinical Social Worker Direct Dial: [phone]  Fax: [phone] Website: [email or web address] 3:42 PM

## 2023-04-08 NOTE — Patient Outreach (Signed)
 Care Coordination   Follow Up Visit Note   04/07/2023 Name: James [name] MRN: [id number] DOB: 1981/01/06  MALE James [name] is a 43 y.o. year old male who sees James [name], [place] for primary care. I spoke with  James [name] mother and sibling, James [name], by phone today.  What matters to the patients health and wellness today?  Symptom Management, Level of Care, Caregiver Stress, Grief    Goals Addressed             This Visit's Progress    Obtain Supportive Resources   On track    Activities and task to complete in order to accomplish goals.   Keep all upcoming appointments discussed today Continue with compliance of taking medication prescribed by Doctor Implement healthy coping skills discussed to assist with management of symptoms F/up with James [name] regarding therapy and job readiness options F/up with Medicaid regarding benefit and eligibility for PCS and/or ACTT services         SDOH assessments and interventions completed:  No     Care Coordination Interventions:  Yes, provided  Interventions Today    Flowsheet Row Most Recent Value  Chronic Disease   Chronic disease during today's visit Other  [Schizophrenia]  General Interventions   General Interventions Discussed/Reviewed General Interventions Reviewed, [employer], Doctor Visits, Level of Care  Doctor Visits Discussed/Reviewed Doctor Visits Reviewed  Level of Care Adult Daycare, Personal Care Services, Applications  [LCSW discussed various level of care options. Family interested in pt remaining in the home,  however, endorses need for med assistance, meal assistance/prep. Need to f/up with Medicaid regarding eligibility for PCS and add'l services]  Mental Health Interventions   Mental Health Discussed/Reviewed Mental Health Reviewed, Coping Strategies, Grief and Loss  [Family endorsed increase in stress and limited support since decline in pt's father's health. Encouragement and validation  provided. Strategies discussed to assist with strenghtening support in the home, in addition, to management of symptoms for pt]  Nutrition Interventions   Nutrition Discussed/Reviewed Nutrition Reviewed  [Family endorsed need to assist with pt having meals preppred/provided and ensuring pt eats to support caregiver in the home]  Pharmacy Interventions   Pharmacy Dicussed/Reviewed Pharmacy Topics Reviewed, Medication Adherence  [Pt's mother assists with refilling pt's pill box weekly]  Safety Interventions   Safety Discussed/Reviewed Safety Reviewed, Home Safety       Follow up plan: Follow up call scheduled for 2-4 weeks    Encounter Outcome:  Patient Visit Completed   James Lucks, LCSW [place]  [facility], [facility] Clinical Social Worker Direct Dial: [phone]  Fax: [phone] Website: [email or web address] 3:40 PM

## 2023-05-05 ENCOUNTER — Ambulatory Visit: Payer: Self-pay | Admitting: Licensed Clinical Social Worker

## 2023-05-09 NOTE — Patient Outreach (Signed)
 Care Coordination   Follow Up Visit Note   05/05/2023 Name: James [name] MRN: [id number] DOB: 1980/02/17  James [name] is a 43 y.o. year old male who sees Saguier, [place], [place] for primary care. I spoke with  [name] mother by phone today.  What matters to the patients health and wellness today?  Symptom Management, Grief, Caregiver Stress    Goals Addressed             This Visit's Progress    Obtain Supportive Resources   On track    Activities and task to complete in order to accomplish goals.   Keep all upcoming appointments discussed today Continue with compliance of taking medication prescribed by Doctor Implement healthy coping skills discussed to assist with management of symptoms F/up with [name] regarding therapy and job readiness options F/up with Medicaid regarding benefit and eligibility for PCS and/or ACTT services         SDOH assessments and interventions completed:  No     Care Coordination Interventions:  Yes, provided  Interventions Today    Flowsheet Row Most Recent Value  Chronic Disease   Chronic disease during today's visit Other  [Schizophrenia]  General Interventions   General Interventions Discussed/Reviewed General Interventions Reviewed, [employer], Doctor Visits, Level of Care  Doctor Visits Discussed/Reviewed Doctor Visits Reviewed  Mental Health Interventions   Mental Health Discussed/Reviewed Mental Health Reviewed, Coping Strategies, Grief and Loss  [Caregiver Stress]  Pharmacy Interventions   Pharmacy Dicussed/Reviewed Pharmacy Topics Reviewed, Medication Adherence  Safety Interventions   Safety Discussed/Reviewed Safety Reviewed       Follow up plan: Follow up call scheduled for 2-4 weeks    Encounter Outcome:  Patient Visit Completed   Jenel Lucks, LCSW [place]  [facility], [facility] Clinical Social Worker Direct Dial: [phone]  Fax: [phone] Website:  [email or web address] 9:15 AM

## 2023-05-09 NOTE — Patient Instructions (Signed)
 Visit Information  Thank you for taking time to visit with me today. Please don't hesitate to contact me if I can be of assistance to you.   Following are the goals we discussed today:   Goals Addressed             This Visit's Progress    Obtain Supportive Resources   On track    Activities and task to complete in order to accomplish goals.   Keep all upcoming appointments discussed today Continue with compliance of taking medication prescribed by Doctor Implement healthy coping skills discussed to assist with management of symptoms F/up with [name] regarding therapy and job readiness options F/up with Medicaid regarding benefit and eligibility for [facility] and/or ACTT services         Our next appointment is by telephone on 4/14 at 3 PM  Please call the care guide team at [phone] if you need to cancel or reschedule your appointment.   If you are experiencing a Mental Health or Behavioral Health Crisis or need someone to talk to, please call the Suicide and Crisis Lifeline: 988 go to [facility] [address], [place] [phone]) call 911   The patient verbalized understanding of instructions, educational materials, and care plan provided today and DECLINED offer to receive copy of patient instructions, educational materials, and care plan.   Windy Fast [facility]  [facility], [facility] Clinical Social Worker Direct Dial: [phone]  Fax: [phone] Website: [email or web address] 9:16 AM

## 2023-05-14 ENCOUNTER — Emergency Department (EMERGENCY_DEPARTMENT_HOSPITAL)
Admission: EM | Admit: 2023-05-14 | Discharge: 2023-05-15 | Disposition: A | Discharge status: Other Acute Inpatient Hospital | Source: Home / Self Care | Attending: Emergency Medicine | Admitting: Emergency Medicine

## 2023-05-14 DIAGNOSIS — F209 Schizophrenia, unspecified: Secondary | ICD-10-CM | POA: Insufficient documentation

## 2023-05-14 DIAGNOSIS — Z7984 Long term (current) use of oral hypoglycemic drugs: Secondary | ICD-10-CM | POA: Insufficient documentation

## 2023-05-14 DIAGNOSIS — F411 Generalized anxiety disorder: Secondary | ICD-10-CM | POA: Insufficient documentation

## 2023-05-14 DIAGNOSIS — R259 Unspecified abnormal involuntary movements: Secondary | ICD-10-CM | POA: Diagnosis not present

## 2023-05-14 DIAGNOSIS — Z046 Encounter for general psychiatric examination, requested by authority: Secondary | ICD-10-CM

## 2023-05-15 ENCOUNTER — Inpatient Hospital Stay (HOSPITAL_COMMUNITY)
Admission: AD | Admit: 2023-05-15 | Discharge: 2023-05-28 | DRG: 885 | Disposition: A | Discharge status: Home / Self Care | Source: Intra-hospital | Attending: Psychiatry | Admitting: Psychiatry

## 2023-05-15 ENCOUNTER — Encounter (HOSPITAL_COMMUNITY): Payer: Self-pay

## 2023-05-15 ENCOUNTER — Other Ambulatory Visit: Payer: Self-pay

## 2023-05-15 ENCOUNTER — Encounter (HOSPITAL_COMMUNITY): Payer: Self-pay | Admitting: Psychiatry

## 2023-05-15 DIAGNOSIS — F411 Generalized anxiety disorder: Secondary | ICD-10-CM | POA: Diagnosis present

## 2023-05-15 DIAGNOSIS — Z8042 Family history of malignant neoplasm of prostate: Secondary | ICD-10-CM | POA: Diagnosis not present

## 2023-05-15 DIAGNOSIS — F209 Schizophrenia, unspecified: Secondary | ICD-10-CM

## 2023-05-15 DIAGNOSIS — Q828 Other specified congenital malformations of skin: Secondary | ICD-10-CM | POA: Diagnosis not present

## 2023-05-15 DIAGNOSIS — Z91148 Patient's other noncompliance with medication regimen for other reason: Secondary | ICD-10-CM

## 2023-05-15 DIAGNOSIS — Z79899 Other long term (current) drug therapy: Secondary | ICD-10-CM | POA: Diagnosis not present

## 2023-05-15 DIAGNOSIS — Z833 Family history of diabetes mellitus: Secondary | ICD-10-CM

## 2023-05-15 DIAGNOSIS — F17210 Nicotine dependence, cigarettes, uncomplicated: Secondary | ICD-10-CM | POA: Diagnosis present

## 2023-05-15 DIAGNOSIS — Z888 Allergy status to other drugs, medicaments and biological substances status: Secondary | ICD-10-CM

## 2023-05-15 DIAGNOSIS — F200 Paranoid schizophrenia: Secondary | ICD-10-CM | POA: Diagnosis not present

## 2023-05-15 DIAGNOSIS — Z7984 Long term (current) use of oral hypoglycemic drugs: Secondary | ICD-10-CM

## 2023-05-15 DIAGNOSIS — Z9102 Food additives allergy status: Secondary | ICD-10-CM

## 2023-05-15 DIAGNOSIS — F201 Disorganized schizophrenia: Secondary | ICD-10-CM | POA: Diagnosis not present

## 2023-05-15 LAB — CBC
HCT: 47.4 % (ref 39.0–52.0)
Hemoglobin: 15.4 g/dL (ref 13.0–17.0)
MCH: 29 pg (ref 26.0–34.0)
MCHC: 32.5 g/dL (ref 30.0–36.0)
MCV: 89.3 fL (ref 80.0–100.0)
Platelets: 186 10*3/uL (ref 150–400)
RBC: 5.31 MIL/uL (ref 4.22–5.81)
RDW: 13.9 % (ref 11.5–15.5)
WBC: 6.2 10*3/uL (ref 4.0–10.5)
nRBC: 0 % (ref 0.0–0.2)

## 2023-05-15 LAB — COMPREHENSIVE METABOLIC PANEL WITH GFR
ALT: 37 U/L (ref 0–44)
AST: 31 U/L (ref 15–41)
Albumin: 4.5 g/dL (ref 3.5–5.0)
Alkaline Phosphatase: 60 U/L (ref 38–126)
Anion gap: 7 (ref 5–15)
BUN: 15 mg/dL (ref 6–20)
CO2: 21 mmol/L — ABNORMAL LOW (ref 22–32)
Calcium: 9.3 mg/dL (ref 8.9–10.3)
Chloride: 111 mmol/L (ref 98–111)
Creatinine, Ser: 0.92 mg/dL (ref 0.61–1.24)
GFR, Estimated: >60 mL/min (ref >60)
Glucose, Bld: 97 mg/dL (ref 70–99)
Potassium: 3.7 mmol/L (ref 3.5–5.1)
Sodium: 139 mmol/L (ref 135–145)
Total Bilirubin: 0.8 mg/dL (ref 0.0–1.2)
Total Protein: 7.8 g/dL (ref 6.5–8.1)

## 2023-05-15 LAB — RAPID URINE DRUG SCREEN, HOSP PERFORMED
Amphetamines: NOT DETECTED
Barbiturates: NOT DETECTED
Benzodiazepines: NOT DETECTED
Cocaine: NOT DETECTED
Opiates: NOT DETECTED
Tetrahydrocannabinol: NOT DETECTED

## 2023-05-15 LAB — SALICYLATE LEVEL: Salicylate Lvl: <7 mg/dL — ABNORMAL LOW (ref 7.0–30.0)

## 2023-05-15 LAB — ACETAMINOPHEN LEVEL: Acetaminophen (Tylenol), Serum: <10 ug/mL — ABNORMAL LOW (ref 10–30)

## 2023-05-15 LAB — ETHANOL: Alcohol, Ethyl (B): <10 mg/dL (ref <10)

## 2023-05-15 MED ORDER — MAGNESIUM HYDROXIDE 400 MG/5ML PO SUSP
30.0000 mL | Freq: Every day | ORAL | Status: DC | PRN
Start: 2023-05-15 — End: 2023-05-28

## 2023-05-15 MED ORDER — LORAZEPAM 2 MG/ML IJ SOLN: 2.0000 mg | Freq: Three times a day (TID) | INTRAMUSCULAR | Status: DC | PRN

## 2023-05-15 MED ORDER — BENZTROPINE MESYLATE 0.5 MG PO TABS: 0.5000 mg | ORAL_TABLET | Freq: Two times a day (BID) | ORAL | Status: DC | PRN

## 2023-05-15 MED ORDER — LITHIUM CARBONATE ER 450 MG PO TBCR
450.0000 mg | EXTENDED_RELEASE_TABLET | Freq: Two times a day (BID) | ORAL | Status: DC
  Administered 2023-05-15 – 2023-05-28 (×26): 450 mg via ORAL
  Filled 2023-05-15 (×32): qty 1

## 2023-05-15 MED ORDER — DIPHENHYDRAMINE HCL 25 MG PO CAPS
50.0000 mg | ORAL_CAPSULE | Freq: Three times a day (TID) | ORAL | Status: DC | PRN
Start: 2023-05-15 — End: 2023-05-28

## 2023-05-15 MED ORDER — HALOPERIDOL LACTATE 5 MG/ML IJ SOLN: 10.0000 mg | Freq: Three times a day (TID) | INTRAMUSCULAR | Status: DC | PRN

## 2023-05-15 MED ORDER — BENZTROPINE MESYLATE 0.5 MG PO TABS
0.5000 mg | ORAL_TABLET | Freq: Two times a day (BID) | ORAL | Status: DC | PRN
Start: 2023-05-15 — End: 2023-05-15

## 2023-05-15 MED ORDER — DIPHENHYDRAMINE HCL 50 MG/ML IJ SOLN: 50.0000 mg | Freq: Three times a day (TID) | INTRAMUSCULAR | Status: DC | PRN

## 2023-05-15 MED ORDER — HYDROXYZINE HCL 25 MG PO TABS: 25.0000 mg | ORAL_TABLET | Freq: Three times a day (TID) | ORAL | Status: DC | PRN

## 2023-05-15 MED ORDER — HALOPERIDOL LACTATE 5 MG/ML IJ SOLN: 5.0000 mg | Freq: Three times a day (TID) | INTRAMUSCULAR | Status: DC | PRN

## 2023-05-15 MED ORDER — TRAZODONE HCL 50 MG PO TABS
50.0000 mg | ORAL_TABLET | Freq: Every evening | ORAL | Status: DC | PRN
  Administered 2023-05-21: 50 mg via ORAL
  Filled 2023-05-15: qty 1

## 2023-05-15 MED ORDER — HALOPERIDOL 5 MG PO TABS: 5.0000 mg | ORAL_TABLET | Freq: Three times a day (TID) | ORAL | Status: DC | PRN

## 2023-05-15 MED ORDER — LITHIUM CARBONATE ER 450 MG PO TBCR
450.0000 mg | EXTENDED_RELEASE_TABLET | Freq: Two times a day (BID) | ORAL | Status: DC
Start: 2023-05-15 — End: 2023-05-15
  Administered 2023-05-15: 450 mg via ORAL
  Filled 2023-05-15: qty 1

## 2023-05-15 MED ORDER — ACETAMINOPHEN 325 MG PO TABS: 650.0000 mg | ORAL_TABLET | Freq: Four times a day (QID) | ORAL | Status: DC | PRN

## 2023-05-15 MED ORDER — OLANZAPINE 10 MG PO TABS
10.0000 mg | ORAL_TABLET | Freq: Every day | ORAL | Status: DC
Start: 2023-05-16 — End: 2023-05-28
  Administered 2023-05-16 – 2023-05-28 (×13): 10 mg via ORAL
  Filled 2023-05-15 (×18): qty 1

## 2023-05-15 MED ORDER — OLANZAPINE 10 MG PO TABS
10.0000 mg | ORAL_TABLET | Freq: Every day | ORAL | Status: DC
  Administered 2023-05-15: 10 mg via ORAL
  Filled 2023-05-15: qty 1

## 2023-05-15 MED ORDER — ALUM & MAG HYDROXIDE-SIMETH 200-200-20 MG/5ML PO SUSP: 30.0000 mL | ORAL | Status: DC | PRN

## 2023-05-15 NOTE — ED Notes (Signed)
 Report called to [facility] and given to [facility].  Called [facility] for transport of pt to [facility] due to IVC.

## 2023-05-15 NOTE — ED Triage Notes (Addendum)
 Pt BIB [facility] after being picked up from his home and IVC'd by family. Pt doesn't have any further complaints at this time. Denies any HI,SI, or AH.
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
## 2023-05-15 NOTE — ED Notes (Addendum)
 Pt. In burgundy scrubs and wanded by security. Pt. Has 1 belongings bag. Pt. Has no cell phone, no wallet, 1 gray shiet, 1 red/blue pants and 1 brown necklace, bracelet, 1 green rope and 1 silver ring. pt. Belongings locked up at the nurses station in triage.

## 2023-05-15 NOTE — BH Assessment (Signed)
 TTS attempted to see pt. Per Juliette Alcide, EMT, pt is currently sleeping and not awake at this time for an assessment. Pt will be seen during day shift.

## 2023-05-15 NOTE — ED Provider Notes (Signed)
 [place] EMERGENCY DEPARTMENT AT [facility] Provider Note   CSN: [id number] Arrival date & time: 05/14/23  2344     History  Chief Complaint  Patient presents with   Medical Clearance    NIRAV SWEDA is a 43 y.o. male.  The history is provided by the patient and medical records.   43 year old male with history of schizophrenia, presenting to the ED under IVC petition by family.  Per paperwork, patient has not been eating or sleeping, has been aggressive towards family, voiced wanting to kill his sister, has been hallucinating.  Is unclear if he has been taking his medication or not.  Patient himself denies any complaints.  He denies any SI/HI.  He states he just feels tired and wants to go to bed.  Home Medications Prior to Admission medications   Medication Sig Start Date End Date Taking? Authorizing Provider  ARIPiprazole (ABILIFY) 10 MG tablet Take 1 tablet (10 mg total) by mouth 2 (two) times daily. 11/27/22   Saguier, [name], PA-C  benztropine (COGENTIN) 0.5 MG tablet Take 1 tablet (0.5 mg total) by mouth 2 (two) times daily as needed for tremors (EPS). 11/04/22   Saguier, [name], PA-C  cholecalciferol (VITAMIN D3) 25 MCG (1000 UNIT) tablet Take 2,000 Units by mouth daily. Patient not taking: Reported on 10/16/2022    [provider]  lithium carbonate (ESKALITH) 450 MG ER tablet Take 1 tablet (450 mg total) by mouth every 12 (twelve) hours. 11/04/22   Saguier, [name], PA-C  metFORMIN (GLUCOPHAGE) 500 MG tablet TAKE 1 TABLET BY MOUTH TWICE DAILY WITH MEALS 03/15/21   Saguier, [name], PA-C  Multiple Vitamins-Minerals (MULTIVITAMIN WITH MINERALS) tablet Take 1 tablet by mouth daily.    [provider]      Allergies    Clozapine, Latuda [lurasidone], and Mango flavoring agent (non-screening)    Review of Systems   Review of Systems  Psychiatric/Behavioral:  Positive for behavioral problems.        IVC  All other systems reviewed and are  negative.   Physical Exam Updated Vital Signs BP (!) 122/97 (BP Location: Left Arm)   Pulse 84   Temp 98.1 F (36.7 C) (Oral)   Resp 18   SpO2 99%   Physical Exam Vitals and nursing note reviewed.  Constitutional:      Appearance: He is well-developed.     Comments: Somewhat disheveled appearing, pacing around room  HENT:     Head: Normocephalic and atraumatic.  Eyes:     Conjunctiva/sclera: Conjunctivae normal.     Pupils: Pupils are equal, round, and reactive to light.  Cardiovascular:     Rate and Rhythm: Normal rate and regular rhythm.     Heart sounds: Normal heart sounds.  Pulmonary:     Effort: Pulmonary effort is normal.     Breath sounds: Normal breath sounds.  Abdominal:     General: Bowel sounds are normal.     Palpations: Abdomen is soft.  Musculoskeletal:        General: Normal range of motion.     Cervical back: Normal range of motion.  Skin:    General: Skin is warm and dry.  Neurological:     Mental Status: He is alert and oriented to person, place, and time.  Psychiatric:     Comments: Denies SI/HI, answering questions as normal, does not seem to be responding to internal stimuli currently     ED Results / Procedures / Treatments   Labs (  all labs ordered are listed, but only abnormal results are displayed) Labs Reviewed  COMPREHENSIVE METABOLIC PANEL WITH GFR - Abnormal; Notable for the following components:      Result Value   CO2 21 (*)    All other components within normal limits  SALICYLATE LEVEL - Abnormal; Notable for the following components:   Salicylate Lvl <7.0 (*)    All other components within normal limits  ACETAMINOPHEN LEVEL - Abnormal; Notable for the following components:   Acetaminophen (Tylenol), Serum <10 (*)    All other components within normal limits  ETHANOL  CBC  RAPID URINE DRUG SCREEN, HOSP PERFORMED    EKG None  Radiology No results found.  Procedures Procedures    Medications Ordered in  ED Medications - No data to display  ED Course/ Medical Decision Making/ A&P                                 Medical Decision Making Amount and/or Complexity of Data Reviewed Labs: ordered. ECG/medicine tests: ordered and independent interpretation performed.   43 y.o. M here under IVC petitioned by family.  Reportedly some aggressive behavior towards family at home, threatened to kill sister, not eating/sleeping, etc.  Patient has no acute complaints.    Labs obtained-- no leukocytosis or electrolyte derangement.  Negative APAP, salicylate, and ethanol levels.  UDS negative.  Medically cleared.  Will get TTS evaluation.  First exam filed.  Final Clinical Impression(s) / ED Diagnoses Final diagnoses:  Involuntary commitment    Rx / DC Orders ED Discharge Orders     None         Garlon Hatchet, PA-C 05/15/23 0617    Palumbo, April, MD 05/15/23 [phone]

## 2023-05-15 NOTE — Progress Notes (Signed)
 Pt has been accepted to [facility] on 05/15/2023 Bed assignment: 404-1  Pt meets inpatient criteria per: Alona Bene, PMHNP   Attending Physician will be: . Dr. Herma Carson  Report can be called to: Adult unit: [phone]  Pt can arrive after discharges   Care Team Notified: [facility] Malva Limes RN, Jacquelynn Cree NT, April Wilson paramedic, Synetta Fail RN,  [place], PMHNP   [place] Journie Howson LCSW-A   05/15/2023 12:52 PM

## 2023-05-15 NOTE — Tx Team (Signed)
 Initial Treatment Plan 05/15/2023 6:46 PM James [name] ZOX:[id number]    PATIENT STRESSORS: Marital or family conflict   Medication change or noncompliance     PATIENT STRENGTHS: Communication skills  Physical Health  Supportive family/friends    PATIENT IDENTIFIED PROBLEMS: Medication non compliance  Per IVC aggressive with family  Per IVC poor eating  Per IVC poor sleep               DISCHARGE CRITERIA:  Improved stabilization in mood, thinking, and/or behavior Need for constant or close observation no longer present  PRELIMINARY DISCHARGE PLAN: Return to previous living arrangement  PATIENT/FAMILY INVOLVEMENT: This treatment plan has been presented to and reviewed with the patient, James [name].  The patient has been given the opportunity to ask questions and make suggestions.  Edwyna Perfect, RN 05/15/2023, 6:46 PM
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
## 2023-05-16 ENCOUNTER — Encounter (HOSPITAL_COMMUNITY): Payer: Self-pay

## 2023-05-16 DIAGNOSIS — F209 Schizophrenia, unspecified: Secondary | ICD-10-CM

## 2023-05-16 MED ORDER — NICOTINE 21 MG/24HR TD PT24
21.0000 mg | MEDICATED_PATCH | Freq: Every day | TRANSDERMAL | Status: DC
  Filled 2023-05-16 (×9): qty 1

## 2023-05-16 MED ORDER — OLANZAPINE 10 MG PO TABS
10.0000 mg | ORAL_TABLET | Freq: Every day | ORAL | Status: AC
  Administered 2023-05-16: 10 mg via ORAL
  Filled 2023-05-16: qty 1

## 2023-05-16 NOTE — Progress Notes (Signed)
   05/16/23 2145  Psych Admission Type (Psych Patients Only)  Admission Status Involuntary  Psychosocial Assessment  Patient Complaints None  Eye Contact Brief  Facial Expression Flat  Affect Preoccupied  Speech Logical/coherent  Interaction Isolative  Motor Activity Slow  Appearance/Hygiene In scrubs  Behavior Characteristics Appropriate to situation  Mood Preoccupied;Pleasant  Thought Process  Coherency WDL  Content UTA  Delusions None reported or observed  Perception Hallucinations  Hallucination Auditory  Judgment Impaired  Confusion None  Danger to Self  Current suicidal ideation? Denies  Agreement Not to Harm Self Yes  Description of Agreement verbal  Danger to Others  Danger to Others None reported or observed
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
## 2023-05-16 NOTE — Group Note (Signed)
 Date:  05/16/2023 Time:  8:44 AM  Group Topic/Focus:  Emotional Education:   The focus of this group is to discuss what feelings/emotions are, and how they are experienced.    Participation Level:  Did Not Attend   Erasmo Score 05/16/2023, 8:44 AM

## 2023-05-16 NOTE — Group Note (Signed)
 Recreation Therapy Group Note   Group Topic:Leisure Education  Group Date: 05/16/2023 Start Time: 0930 End Time: 0955 Facilitators: Janvi Ammar-McCall, LRT,CTRS Location: [address] Dayroom   Group Topic: Leisure Education  Goal Area(s) Addresses:  Patient will identify positive leisure activities for use post discharge. Patient will identify at least one positive benefit of participation in leisure activities.  Patient will work effectively work with peers in making sure activity goes as planned.  Intervention: [facility]   Activity: Patients were placed in a circle in the dayroom and given a beach ball. Patients were to play a game where they were to keep the ball moving at all times. LRT would time the group as they play the game. If the ball were to come to a stop, the time would start over.  Education:  Leisure Scientist, physiological, Special educational needs teacher, Teamwork, Discharge Planning  Education Outcome: Acknowledges education/In group clarification offered/Needs additional education.    Affect/Mood: N/A   Participation Level: Did not attend    Clinical Observations/Individualized Feedback:     Plan: Continue to engage patient in RT group sessions 2-3x/week.   Laurice Kimmons-McCall, LRT,CTRS  05/16/2023 12:53 PM

## 2023-05-16 NOTE — Plan of Care (Signed)
  Problem: Coping: Goal: Ability to verbalize frustrations and anger appropriately will improve Outcome: Progressing   Problem: Safety: Goal: Periods of time without injury will increase Outcome: Progressing   Problem: Physical Regulation: Goal: Ability to maintain clinical measurements within normal limits will improve Outcome: Progressing

## 2023-05-16 NOTE — Plan of Care (Signed)
   Problem: Education: Goal: Knowledge of [facility] Education information/materials will improve Outcome: Progressing Goal: Emotional status will improve Outcome: Progressing Goal: Mental status will improve Outcome: Progressing

## 2023-05-16 NOTE — Progress Notes (Signed)
   05/16/23 0300  Psych Admission Type (Psych Patients Only)  Admission Status Involuntary  Psychosocial Assessment  Patient Complaints Sleep disturbance (Pt resting in bed but able to awaken for brief discussion. Had to be prompted with questions several times since d/t dozing off during discussion. Pt took meds and resumed sleeping.)  Eye Contact Brief  Facial Expression Flat  Affect Preoccupied  Speech Logical/coherent  Interaction Forwards little;Minimal  Motor Activity Lethargic  Appearance/Hygiene In scrubs  Behavior Characteristics Cooperative  Mood Preoccupied  Thought Process  Coherency WDL  Content WDL  Delusions None reported or observed  Perception Hallucinations  Hallucination Auditory (Denied current AH but endorsed, "normally I hear a voice that says, 'if you sneeze go get you some water'.")  Judgment Impaired  Confusion None  Danger to Self  Current suicidal ideation? Denies  Agreement Not to Harm Self Yes  Description of Agreement Verbal  Danger to Others  Danger to Others None reported or observed
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
## 2023-05-16 NOTE — Plan of Care (Signed)
   Problem: Education: Goal: Emotional status will improve Outcome: Progressing Goal: Mental status will improve Outcome: Progressing Goal: Verbalization of understanding the information provided will improve Outcome: Progressing   Problem: Activity: Goal: Interest or engagement in activities will improve Outcome: Progressing

## 2023-05-16 NOTE — Progress Notes (Signed)
   05/16/23 0800  Psych Admission Type (Psych Patients Only)  Admission Status Involuntary  Psychosocial Assessment  Patient Complaints Sleep disturbance  Eye Contact Brief  Facial Expression Flat  Affect Preoccupied  Speech Logical/coherent  Interaction Forwards little;Minimal  Motor Activity Pacing  Appearance/Hygiene In scrubs  Behavior Characteristics Cooperative;Appropriate to situation  Mood Preoccupied  Thought Process  Coherency WDL  Content WDL  Delusions Controlling  Perception Hallucinations  Hallucination Auditory  Judgment Impaired  Confusion None  Danger to Self  Current suicidal ideation? Denies  Agreement Not to Harm Self Yes  Description of Agreement verbal  Danger to Others  Danger to Others None reported or observed   Patient paces around most of the day, he's noted to be responding to internal stimuli. Although, he denies any auditory hallucination to this Clinical research associate, however his words did not portray his actions. Patient Denies SI/HI/VH, he took his medication with no side effect noted. No acute distress noted at this time, q15 minutes safety observation in place. Staff will continue to provide support to patient.

## 2023-05-16 NOTE — BH IP Treatment Plan (Signed)
 Interdisciplinary Treatment and Diagnostic Plan Update  05/16/2023 Time of Session: 10:55 AM James [name] MRN: [id number]  Principal Diagnosis: Schizophrenia [facility])  Secondary Diagnoses: Principal Problem:   Schizophrenia (HCC)   Current Medications:  Current Facility-Administered Medications  Medication Dose Route Frequency Provider Last Rate Last Admin   acetaminophen (TYLENOL) tablet 650 mg  650 mg Oral Q6H PRN Motley-Mangrum, Jadeka A, PMHNP       alum & mag hydroxide-simeth (MAALOX/MYLANTA) 200-200-20 MG/5ML suspension 30 mL  30 mL Oral Q4H PRN Motley-Mangrum, Jadeka A, PMHNP       benztropine (COGENTIN) tablet 0.5 mg  0.5 mg Oral BID PRN Motley-Mangrum, Jadeka A, PMHNP       haloperidol (HALDOL) tablet 5 mg  5 mg Oral TID PRN Motley-Mangrum, Jadeka A, PMHNP       And   diphenhydrAMINE (BENADRYL) capsule 50 mg  50 mg Oral TID PRN Motley-Mangrum, Jadeka A, PMHNP       haloperidol lactate (HALDOL) injection 5 mg  5 mg Intramuscular TID PRN Motley-Mangrum, Jadeka A, PMHNP       And   diphenhydrAMINE (BENADRYL) injection 50 mg  50 mg Intramuscular TID PRN Motley-Mangrum, Jadeka A, PMHNP       And   LORazepam (ATIVAN) injection 2 mg  2 mg Intramuscular TID PRN Motley-Mangrum, Jadeka A, PMHNP       haloperidol lactate (HALDOL) injection 10 mg  10 mg Intramuscular TID PRN Motley-Mangrum, Jadeka A, PMHNP       And   diphenhydrAMINE (BENADRYL) injection 50 mg  50 mg Intramuscular TID PRN Motley-Mangrum, Jadeka A, PMHNP       And   LORazepam (ATIVAN) injection 2 mg  2 mg Intramuscular TID PRN Motley-Mangrum, Jadeka A, PMHNP       hydrOXYzine (ATARAX) tablet 25 mg  25 mg Oral TID PRN Motley-Mangrum, Geralynn Ochs A, PMHNP       lithium carbonate (ESKALITH) ER tablet 450 mg  450 mg Oral Q12H Motley-Mangrum, Jadeka A, PMHNP   450 mg at 05/16/23 0811   magnesium hydroxide (MILK OF MAGNESIA) suspension 30 mL  30 mL Oral Daily PRN Motley-Mangrum, Jadeka A, PMHNP       OLANZapine (ZYPREXA) tablet  10 mg  10 mg Oral Daily Motley-Mangrum, Jadeka A, PMHNP   10 mg at 05/16/23 0811   traZODone (DESYREL) tablet 50 mg  50 mg Oral QHS PRN Motley-Mangrum, Jadeka A, PMHNP       PTA Medications: Medications Prior to Admission  Medication Sig Dispense Refill Last Dose/Taking   ARIPiprazole (ABILIFY) 10 MG tablet Take 1 tablet (10 mg total) by mouth 2 (two) times daily. (Patient not taking: Reported on 05/15/2023) 60 tablet 1    ARIPiprazole (ABILIFY) 20 MG tablet Take 20 mg by mouth daily.      benztropine (COGENTIN) 0.5 MG tablet Take 1 tablet (0.5 mg total) by mouth 2 (two) times daily as needed for tremors (EPS). (Patient taking differently: Take 0.5 mg by mouth 2 (two) times daily.) 60 tablet 3    cholecalciferol (VITAMIN D3) 25 MCG (1000 UNIT) tablet Take 2,000 Units by mouth daily.      lithium carbonate (ESKALITH) 450 MG ER tablet Take 1 tablet (450 mg total) by mouth every 12 (twelve) hours. 60 tablet 3    metFORMIN (GLUCOPHAGE) 500 MG tablet TAKE 1 TABLET BY MOUTH TWICE DAILY WITH MEALS (Patient not taking: Reported on 05/15/2023) 180 tablet 0    Multiple Vitamins-Minerals (MULTIVITAMIN WITH MINERALS) tablet Take 1 tablet by  mouth daily.      OLANZapine (ZYPREXA) 10 MG tablet Take 10 mg by mouth daily.      OLANZapine (ZYPREXA) 20 MG tablet Take 20 mg by mouth at bedtime.       Patient Stressors: Marital or family conflict   Medication change or noncompliance    Patient Strengths: Manufacturing systems engineer  Physical Health  Supportive family/friends   Treatment Modalities: Medication Management, Group therapy, Case management,  1 to 1 session with clinician, Psychoeducation, Recreational therapy.   Physician Treatment Plan for Primary Diagnosis: Schizophrenia (HCC) Long Term Goal(s):     Short Term Goals:    Medication Management: Evaluate patient's response, side effects, and tolerance of medication regimen.  Therapeutic Interventions: 1 to 1 sessions, Unit Group sessions and  Medication administration.  Evaluation of Outcomes: Not Progressing  Physician Treatment Plan for Secondary Diagnosis: Principal Problem:   Schizophrenia (HCC)  Long Term Goal(s):     Short Term Goals:       Medication Management: Evaluate patient's response, side effects, and tolerance of medication regimen.  Therapeutic Interventions: 1 to 1 sessions, Unit Group sessions and Medication administration.  Evaluation of Outcomes: Not Progressing   RN Treatment Plan for Primary Diagnosis: Schizophrenia (HCC) Long Term Goal(s): Knowledge of disease and therapeutic regimen to maintain health will improve  Short Term Goals: Ability to remain free from injury will improve, Ability to verbalize frustration and anger appropriately will improve, Ability to demonstrate self-control, Ability to participate in decision making will improve, Ability to verbalize feelings will improve, Ability to disclose and discuss suicidal ideas, Ability to identify and develop effective coping behaviors will improve, and Compliance with prescribed medications will improve  Medication Management: RN will administer medications as ordered by provider, will assess and evaluate patient's response and provide education to patient for prescribed medication. RN will report any adverse and/or side effects to prescribing provider.  Therapeutic Interventions: 1 on 1 counseling sessions, Psychoeducation, Medication administration, Evaluate responses to treatment, Monitor vital signs and CBGs as ordered, Perform/monitor CIWA, COWS, AIMS and Fall Risk screenings as ordered, Perform wound care treatments as ordered.  Evaluation of Outcomes: Not Progressing   LCSW Treatment Plan for Primary Diagnosis: Schizophrenia (HCC) Long Term Goal(s): Safe transition to appropriate next level of care at discharge, Engage patient in therapeutic group addressing interpersonal concerns.  Short Term Goals: Engage patient in aftercare planning  with referrals and resources, Increase social support, Increase ability to appropriately verbalize feelings, Increase emotional regulation, Facilitate acceptance of mental health diagnosis and concerns, Facilitate patient progression through stages of change regarding substance use diagnoses and concerns, Identify triggers associated with mental health/substance abuse issues, and Increase skills for wellness and recovery  Therapeutic Interventions: Assess for all discharge needs, 1 to 1 time with Social worker, Explore available resources and support systems, Assess for adequacy in community support network, Educate family and significant other(s) on suicide prevention, Complete Psychosocial Assessment, Interpersonal group therapy.  Evaluation of Outcomes: Not Progressing   Progress in Treatment: Attending groups: No. Participating in groups: No. Taking medication as prescribed: Yes. Toleration medication: Yes. Family/Significant other contact made:  consents are pending Patient understands diagnosis: Yes. Discussing patient identified problems/goals with staff: Yes. Medical problems stabilized or resolved: Yes. Denies suicidal/homicidal ideation: Yes. Issues/concerns per patient self-inventory: No  New problem(s) identified:  No  New Short Term/Long Term Goal(s):    medication stabilization, elimination of SI thoughts, development of comprehensive mental wellness plan.   Patient Goals:  "Last night, I started  feeling confused."     Discharge Plan or Barriers:  Patient recently admitted. CSW will continue to follow and assess for appropriate referrals and possible discharge planning.   Reason for Continuation of Hospitalization: Hallucinations Homicidal ideation Medication stabilization  Estimated Length of Stay:  5 - 7 days  Last 3 [place] Suicide Severity Risk Score: Flowsheet Row [facility]  [facility] Ad[facility] (Discharged) [facility]  C-SSRS RISK CATEGORY No Risk No Risk No Risk       Last PHQ 2/9 Scores:    03/30/2021    1:03 PM 11/21/2019    4:21 AM 08/11/2018    1:21 PM  Depression screen PHQ 2/9  Decreased Interest 0 0 0  Down, Depressed, Hopeless 0 0 0  PHQ - 2 Score 0 0 0  Altered sleeping  1   Tired, decreased energy  0   Change in appetite  0   Feeling bad or failure about yourself   0   Trouble concentrating  1   Moving slowly or fidgety/restless  0   Suicidal thoughts  0   PHQ-9 Score  2   Difficult doing work/chores  Somewhat difficult     Scribe for Treatment Team: Nyra Jabs 05/16/2023 4:31 PM

## 2023-05-17 DIAGNOSIS — F209 Schizophrenia, unspecified: Secondary | ICD-10-CM | POA: Diagnosis not present

## 2023-05-17 LAB — LIPID PANEL
Cholesterol: 148 mg/dL (ref 0–200)
HDL: 56 mg/dL (ref >40)
LDL Cholesterol: 78 mg/dL (ref 0–99)
Total CHOL/HDL Ratio: 2.6 ratio
Triglycerides: 70 mg/dL (ref <150)
VLDL: 14 mg/dL (ref 0–40)

## 2023-05-17 MED ORDER — OLANZAPINE 10 MG PO TABS
20.0000 mg | ORAL_TABLET | Freq: Every day | ORAL | Status: DC
  Filled 2023-05-17 (×2): qty 2

## 2023-05-17 MED ORDER — OLANZAPINE 7.5 MG PO TABS
15.0000 mg | ORAL_TABLET | Freq: Every day | ORAL | Status: DC
  Administered 2023-05-17 – 2023-05-27 (×11): 15 mg via ORAL
  Filled 2023-05-17 (×3): qty 2
  Filled 2023-05-17: qty 6
  Filled 2023-05-17 (×9): qty 2
  Filled 2023-05-17: qty 6

## 2023-05-17 NOTE — Group Note (Signed)
 Date:  05/17/2023 Time:  2:08 PM  Group Topic/Focus:  Wellness Toolbox:   The focus of this group is to discuss various aspects of wellness, balancing those aspects and exploring ways to increase the ability to experience wellness.  Patients will create a wellness toolbox for use upon discharge.    Participation Level:  Did Not Attend  James [name] 05/17/2023, 2:08 PM

## 2023-05-17 NOTE — Group Note (Signed)
 Date:  05/17/2023 Time:  9:41 AM  Group Topic/Focus:  Overcoming Stress:   The focus of this group is to define stress and help patients assess their triggers.    Participation Level:  Did Not Attend  James [name] 05/17/2023, 9:41 AM

## 2023-05-17 NOTE — Progress Notes (Signed)
 D: Patient is alert, oriented, and cooperative. Patient paces much of the day and appears to be responding to internal stimuli. Denies SI, HI, AVH, and verbally contracts for safety. Patient denies physical symptoms/pain.    A: Scheduled medications administered per MD order. Support provided. Patient educated on safety on the unit and medications. Routine safety checks every 15 minutes. Patient stated understanding to tell nurse about any new physical symptoms. Patient understands to tell staff of any needs.     R: No adverse drug reactions noted. Patient remains safe at this time and will continue to monitor.    05/17/23 0900  Psych Admission Type (Psych Patients Only)  Admission Status Involuntary  Psychosocial Assessment  Patient Complaints None  Eye Contact Brief  Facial Expression Flat  Affect Preoccupied  Speech Logical/coherent  Interaction Minimal  Motor Activity Pacing  Appearance/Hygiene In scrubs  Behavior Characteristics Cooperative;Appropriate to situation  Mood Preoccupied  Thought Process  Coherency WDL  Content WDL  Delusions None reported or observed  Perception Hallucinations  Hallucination Auditory  Judgment Impaired  Confusion None  Danger to Self  Current suicidal ideation? Denies  Agreement Not to Harm Self Yes  Description of Agreement verbal  Danger to Others  Danger to Others None reported or observed

## 2023-05-17 NOTE — BHH Counselor (Signed)
 Adult Comprehensive Assessment  Patient ID: James [name], male   DOB: 12-Jun-1980, 43 y.o.   MRN: [id number]  Information Source: Information source: Patient  Current Stressors:  Patient states their primary concerns and needs for treatment are:: The patient stated that he was threating his sister Patient states their goals for this hospitilization and ongoing recovery are:: The patient share that his goal is to want to go home Educational / Learning stressors: none reported Employment / Job issues: none reported Family Relationships: none reported Surveyor, quantity / Lack of resources (include bankruptcy): none reported Housing / Lack of housing: none reported Physical health (include injuries & life threatening diseases): none reported Social relationships: none reported Substance abuse: none reported Bereavement / Loss: none reported  Living/Environment/Situation:  Living Arrangements: Parent Living conditions (as described by patient or guardian): the parent lives with his mother Who else lives in the home?: mom and sister How long has patient lived in current situation?: The patient share all his life What is atmosphere in current home: Comfortable  Family History:  Marital status: Single Are you sexually active?: No What is your sexual orientation?: Asexual Has your sexual activity been affected by drugs, alcohol, medication, or emotional stress?: Denies Does patient have children?: No  Childhood History:  By whom was/is the patient raised?: Both parents Additional childhood history information: "Fine" Description of patient's relationship with caregiver when they were a child: "Good" with both` Patient's description of current relationship with people who raised him/her: The patient share that its fine How were you disciplined when you got in trouble as a child/adolescent?: The patient share he gets punishment and restriction Does patient have siblings?: Yes Number of  Siblings: 6 Description of patient's current relationship with siblings: the patient share that its  fine relationship, 5 sister and 1 brother Did patient suffer any verbal/emotional/physical/sexual abuse as a child?: No Did patient suffer from severe childhood neglect?: No Has patient ever been sexually abused/assaulted/raped as an adolescent or adult?: No Was the patient ever a victim of a crime or a disaster?: No Witnessed domestic violence?: No Has patient been affected by domestic violence as an adult?: No  Education:  Highest grade of school patient has completed: 2 years degree - college Currently a Consulting civil engineer?: No Learning disability?: No  Employment/Work Situation:   Employment Situation: Unemployed Patient's Job has Been Impacted by Current Illness: No What is the Longest Time Patient has Held a Job?: 3 years Where was the Patient Employed at that Time?: [employer] [place] Has Patient ever Been in the 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" How does patient's faith help to cope with current illness?: The patient share that he does not knwo  Leisure/Recreation:   Do You Have Hobbies?: No  Strengths/Needs:   What is the patient's perception of their strengths?: The patient share that he does not know Patient states they can use these personal strengths during their treatment to contribute to their  recovery:  NA Patient states these barriers may affect/interfere with their treatment: None reported Patient states these barriers may affect their return to the community: none reported Other important information patient would like considered in planning for their treatment: none reported  Discharge Plan:   Currently receiving community mental health services: No Patient states concerns and preferences for aftercare planning are: None reported Patient states they will know when they are safe and ready for discharge when: The patient share whever its possible Does patient have access to transportation?: Yes Does patient have financial barriers related to discharge medications?: No Patient description of barriers related to discharge medications: none reported Will patient be returning to same living situation after discharge?: Yes  Summary/Recommendations:   Summary and Recommendations (to be completed by the evaluator): James [name] is a 43 year old African American male that presented (per chart) to the Ed for not eating, sleeping and being aggressive to family and with thoughts on hurting his sister.  The patient is alert and orient X4. The patient has a flat affect and answer questions with short response. The patient lives with his mother and sister. The patient denies SI, HI and AVH at the current time. The patient denies substance use. The patient was able to answer all questions to the best of his ability.  Patient will benefit from crisis stabilization, medication evaluation, group therapy and psychoeducation, in addition to case management for discharge planning. At discharge it is recommended that Patient adhere to the established discharge plan and continue in treatment  Jack Mineau O Dellanira Dillow. 05/17/2023

## 2023-05-17 NOTE — Plan of Care (Signed)

## 2023-05-17 NOTE — Progress Notes (Addendum)
 [facility] MD Progress Note  05/17/2023 12:20 PM James [name]  MRN:  [id number] Subjective:    Patient seen for followup in his room and continues to report AH of multiple voices saying "mean things." Reports difficulty concentrating and patient had notable thought blocking. Patient states he was able to sleep and is eating. Patient reports that the voices sometimes tell him he is better of dead but denies suicidal plan or intent.  Patient denies SI or HI today however was aggressive with family and threatened to kill his sister. Discussed with patient I was able to speak with his mother and legal guardian yesterday and plan is to retitrate his Abilify and olanzapine. Patient is on two antipsychotics due to treatment resistant schizophrenia with history of adverse reaction to Clozapine. Mother would like use to transition oral Abilify to Abilify maintenna given that patient has intermittent compliance. Patient remains internally preoccupied with disorganized speech and thoughts.  Principal Problem: Schizophrenia (HCC) Diagnosis: Principal Problem:   Schizophrenia (HCC)  Total Time spent with patient: 30 minutes  Past Psychiatric History: see H&P  Past Medical History:  Past Medical History:  Diagnosis Date   Gastroesophageal reflux disease 11/25/2017   History reviewed. No pertinent surgical history. Family History:  Family History  Problem Relation Age of Onset   Diabetes Mother    Diabetes Father    Prostate cancer Father    Family Psychiatric  History: see H&P Social History:  Social History   Substance and Sexual Activity  Alcohol Use No   Alcohol/week: 0.0 standard drinks of alcohol   Comment: rare alcohol     Social History   Substance and Sexual Activity  Drug Use No    Social History   Socioeconomic History   Marital status: Single    Spouse name: Not on file   Number of children: Not on file   Years of education: Not on file   Highest education level: Not on file   Occupational History   Not on file  Tobacco Use   Smoking status: Every Day    Current packs/day: 0.50    Types: Cigarettes   Smokeless tobacco: Never  Vaping Use   Vaping status: Never Used  Substance and Sexual Activity   Alcohol use: No    Alcohol/week: 0.0 standard drinks of alcohol    Comment: rare alcohol   Drug use: No   Sexual activity: Yes    Comment: male  Other Topics Concern   Not on file  Social History Narrative   Not on file   Social Drivers of Health   Financial Resource Strain: Not on file  Food Insecurity: No Food Insecurity (05/15/2023)   Hunger Vital Sign    Worried About Running Out of Food in the Last Year: Never true    Ran Out of Food in the Last Year: Never true  Transportation Needs: No Transportation Needs (05/15/2023)   PRAPARE - Administrator, Civil Service (Medical): No    Lack of Transportation (Non-Medical): No  Physical Activity: Not on file  Stress: Not on file  Social Connections: Not on file   Additional Social History:                         Sleep: Fair  Appetite:  Fair  Current Medications: Current Facility-Administered Medications  Medication Dose Route Frequency Provider Last Rate Last Admin   acetaminophen (TYLENOL) tablet 650 mg  650 mg Oral Q6H PRN  Motley-Mangrum, Jadeka A, PMHNP       alum & mag hydroxide-simeth (MAALOX/MYLANTA) 200-200-20 MG/5ML suspension 30 mL  30 mL Oral Q4H PRN Motley-Mangrum, Jadeka A, PMHNP       benztropine (COGENTIN) tablet 0.5 mg  0.5 mg Oral BID PRN Motley-Mangrum, Jadeka A, PMHNP       haloperidol (HALDOL) tablet 5 mg  5 mg Oral TID PRN Motley-Mangrum, Jadeka A, PMHNP       And   diphenhydrAMINE (BENADRYL) capsule 50 mg  50 mg Oral TID PRN Motley-Mangrum, Jadeka A, PMHNP       haloperidol lactate (HALDOL) injection 5 mg  5 mg Intramuscular TID PRN Motley-Mangrum, Jadeka A, PMHNP       And   diphenhydrAMINE (BENADRYL) injection 50 mg  50 mg Intramuscular TID PRN  Motley-Mangrum, Jadeka A, PMHNP       And   LORazepam (ATIVAN) injection 2 mg  2 mg Intramuscular TID PRN Motley-Mangrum, Jadeka A, PMHNP       haloperidol lactate (HALDOL) injection 10 mg  10 mg Intramuscular TID PRN Motley-Mangrum, Jadeka A, PMHNP       And   diphenhydrAMINE (BENADRYL) injection 50 mg  50 mg Intramuscular TID PRN Motley-Mangrum, Jadeka A, PMHNP       And   LORazepam (ATIVAN) injection 2 mg  2 mg Intramuscular TID PRN Motley-Mangrum, Jadeka A, PMHNP       hydrOXYzine (ATARAX) tablet 25 mg  25 mg Oral TID PRN Motley-Mangrum, Jadeka A, PMHNP       lithium carbonate (ESKALITH) ER tablet 450 mg  450 mg Oral Q12H Motley-Mangrum, Jadeka A, PMHNP   450 mg at 05/17/23 0742   magnesium hydroxide (MILK OF MAGNESIA) suspension 30 mL  30 mL Oral Daily PRN Motley-Mangrum, Jadeka A, PMHNP       nicotine (NICODERM CQ - dosed in mg/24 hours) patch 21 mg  21 mg Transdermal Daily Shenae Bonanno, MD       OLANZapine (ZYPREXA) tablet 10 mg  10 mg Oral Daily Motley-Mangrum, Jadeka A, PMHNP   10 mg at 05/17/23 0742   OLANZapine (ZYPREXA) tablet 20 mg  20 mg Oral QHS Billye Pickerel, MD       traZODone (DESYREL) tablet 50 mg  50 mg Oral QHS PRN Motley-Mangrum, Jadeka A, PMHNP        Lab Results:  Results for orders placed or performed during the hospital encounter of 05/15/23 (from the past 48 hours)  Lipid panel     Status: None   Collection Time: 05/17/23  6:26 AM  Result Value Ref Range   Cholesterol 148 0 - 200 mg/dL   Triglycerides 70 <829 mg/dL   HDL 56 >56 mg/dL   Total CHOL/HDL Ratio 2.6 RATIO   VLDL 14 0 - 40 mg/dL   LDL Cholesterol 78 0 - 99 mg/dL    Comment:        Total Cholesterol/HDL:CHD Risk Coronary Heart Disease Risk Table                     Men   Women  1/2 Average Risk   3.4   3.3  Average Risk       5.0   4.4  2 X Average Risk   9.6   7.1  3 X Average Risk  23.4   11.0        Use the calculated Patient Ratio above and the CHD Risk Table to determine the patient's  CHD Risk.  ATP III CLASSIFICATION (LDL):  <100     mg/dL   Optimal  629-528  mg/dL   Near or Above                    Optimal  130-159  mg/dL   Borderline  413-244  mg/dL   High  >010     mg/dL   Very High Performed at [facility], [address], [place], [place] [address]     Blood Alcohol level:  Lab Results  Component Value Date   ETH <10 05/15/2023   ETH <10 04/13/2021    Metabolic Disorder Labs: Lab Results  Component Value Date   HGBA1C 5.9 10/14/2022   MPG 120 11/23/2019   MPG 111.15 06/27/2018   Lab Results  Component Value Date   PROLACTIN 6.9 04/13/2021   PROLACTIN 3.3 (L) 11/23/2019   Lab Results  Component Value Date   CHOL 148 05/17/2023   TRIG 70 05/17/2023   HDL 56 05/17/2023   CHOLHDL 2.6 05/17/2023   VLDL 14 05/17/2023   LDLCALC 78 05/17/2023   LDLCALC 83 10/14/2022    Physical Findings: AIMS:  , ,  ,  ,    CIWA:    COWS:     Musculoskeletal: Strength & Muscle Tone: within normal limits Gait & Station: normal Patient leans: N/A  Psychiatric Specialty Exam:  Presentation  General Appearance:  Bizarre; Disheveled  Eye Contact: Fair  Speech: Blocked; Garbled  Speech Volume: Decreased  Handedness:No data recorded  Mood and Affect  Mood: Anxious  Affect: Flat   Thought Process  Thought Processes: Disorganized  Descriptions of Associations:Circumstantial  Orientation:Full (Time, Place and Person)  Thought Content:Delusions  History of Schizophrenia/Schizoaffective disorder:Yes  Duration of Psychotic Symptoms:Greater than six months  Hallucinations:Hallucinations: Auditory  Ideas of Reference:Paranoia  Suicidal Thoughts:Suicidal Thoughts: No  Homicidal Thoughts:Homicidal Thoughts: No   Sensorium  Memory: Immediate Fair  Judgment: Poor  Insight: Poor   Executive Functions  Concentration: Poor  Attention Span: Poor  Recall: Fair  Fund of  Knowledge: Fair  Language: Poor   Psychomotor Activity  Psychomotor Activity: Psychomotor Activity: Normal   Assets  Assets: Desire for Improvement; Housing; Social Support   Sleep  Sleep: Sleep: Fair    Physical Exam: Physical exam: Please see exam on admit note. General: Well developed, well nourished.  Pupils: Normal at 3mm Respiratory: Breathing is unlabored.  Cardiovascular: No edema.  Language: No anomia, no aphasia Muscle strength and tone-pt moving all extremities.  Gait not assessed as pt remained in bed.  Neuro: Facial muscles are symmetric. Pt without tremor, no evidence of hyperarousal.  Review of Systems  Constitutional: Negative.   HENT: Negative.    Eyes: Negative.   Respiratory: Negative.    Cardiovascular: Negative.   Skin: Negative.    Blood pressure 103/73, pulse 65, temperature (!) 97.3 F (36.3 C), resp. rate 16, height 5\' 11"  (1.803 m), weight 79.8 kg, SpO2 100%. Body mass index is 24.55 kg/m.   Treatment Plan Summary: 43 y.o., male with PPH of schizophrenia who presented to the ED on 05/14/2023 11:56 PM for not been eating or sleeping, has been aggressive towards family as well as voicing wanting to kill his sister, and hallucinating.   4/12: patient remains visibly psychotic, responding to internal stimuli with derogatory AH. Patient denies SI or HI today. Discussed r/b/a of numerous antipsychotic options with his mother and she does not wish to switch Abilify to Risperdal due to concerns  for EPS. Plan to restart Abilify and titrate Zyprexa to previous doses as well as administer Abilify maintenna as patient is only intermittently compliant with medications at home. Patient does have treatment resistant schizophrenia and previous Clozaril trial which he did not tolerate and is currently listed on his allergies. Mother reports patient does better when he is taking his medications. Patient was aggressive towards his sister and threatening to kill  her.    Daily contact with patient to assess and evaluate symptoms and progress in treatment, Medication management, and Plan - increase Zyprexa to 10 mg qdaily and 15 mg at bedtime  patient is tolerating increase yesterday without side effects, continue Lithium 450 mg BID (home dose), Cogentin 0.5 mg BID-PRN for EPS if needed however patient does not currently have EPS, Plan to restart Abilify tomorrow with plan for Abilify maintenna LAI  Thomas Rhude, MD 05/17/2023, 12:20 PM

## 2023-05-17 NOTE — Progress Notes (Signed)
   05/17/23 1945  Psych Admission Type (Psych Patients Only)  Admission Status Involuntary  Psychosocial Assessment  Patient Complaints Other (Comment) (pt reports he is ready to be discharged so he can go back  home)  Eye Contact Brief  Facial Expression Flat  Affect Preoccupied  Speech Logical/coherent  Interaction No initiation  Motor Activity Slow  Appearance/Hygiene In scrubs;Disheveled  Behavior Characteristics Cooperative;Appropriate to situation  Mood Preoccupied;Pleasant  Thought Process  Coherency WDL  Content UTA  Delusions None reported or observed  Perception Hallucinations  Hallucination Auditory  Judgment Impaired  Confusion None  Danger to Self  Current suicidal ideation? Denies  Agreement Not to Harm Self Yes  Description of Agreement verbal  Danger to Others  Danger to Others None reported or observed

## 2023-05-17 NOTE — Group Note (Signed)
 Date:  05/17/2023 Time:  9:00 PM  Group Topic/Focus:  Wrap-Up Group:   The focus of this group is to help patients review their daily goal of treatment and discuss progress on daily workbooks.    Participation Level:  Did Not Attend  Participation Quality:  Resistant  Affect:  Resistant  Cognitive:  Lacking  Insight: Lacking  Engagement in Group:  Resistant  Modes of Intervention:  Discussion  Additional Comments:  Patient did not participate in wrap up group this evening   Moishe Angel 05/17/2023, 9:00 PM

## 2023-05-18 DIAGNOSIS — F209 Schizophrenia, unspecified: Secondary | ICD-10-CM | POA: Diagnosis not present

## 2023-05-18 MED ORDER — ARIPIPRAZOLE 10 MG PO TABS
10.0000 mg | ORAL_TABLET | Freq: Every day | ORAL | Status: DC
Start: 2023-05-18 — End: 2023-05-19
  Administered 2023-05-18 – 2023-05-19 (×2): 10 mg via ORAL
  Filled 2023-05-18 (×5): qty 1

## 2023-05-18 NOTE — Group Note (Signed)
 Date:  05/18/2023 Time:  9:26 AM  Group Topic/Focus:  Healthy Communication:   The focus of this group is to discuss communication, barriers to communication, as well as healthy ways to communicate with others. Orientation:   The focus of this group is to educate the patient on the purpose and policies of crisis stabilization and provide a format to answer questions about their admission.  The group details unit policies and expectations of patients while admitted.    Participation Level:  Did Not Attend

## 2023-05-18 NOTE — [facility] Group Notes (Signed)
 [facility] Group Notes:  (Nursing/MHT/Case Management/Adjunct)  Date:  05/18/2023  Time:  9:53 PM  Type of Therapy:   Wrap up group  Participation Level:  Did Not Attend  Participation Quality:    Affect:    Cognitive:    Insight:    Engagement in Group:    Modes of Intervention:    Summary of Progress/Problems:  James [name] 05/18/2023, 9:53 PM

## 2023-05-18 NOTE — Progress Notes (Addendum)
 [facility] MD Progress Note  05/18/2023 9:57 AM James [name]  MRN:  [id number]  Collateral from mother: Patient is on two antipsychotics due to treatment resistant schizophrenia with history of adverse reaction to Clozapine. Mother would like use to transition oral Abilify to Abilify maintenna given that patient has intermittent compliance. Patient's mother did not wish to transition from Abilify to [place] or Risperdal consta, due to concern for increased EPS and Parkinsonism/TD. Will plan for restarting oral Abilify and Abilify maintenna LAI.   Nursing report: Patient denies SI, HI or AVH however visibly responding to internal stimuli and seen pacing the halls. Irritable at times, however has not required IM as of yet. Patient disheveled, disorganized and isolating to room.   Subjective:   Patient seen for followup in his room and continues to deny everything including SI, HI or AVH however with significant thought blocking, disorganized speech and thoughts and seen responding to internal stimuli. Patient reports "okay" sleep and appetite and answers 1-2 word responses. Patient tells me that he threatened to kill his sister because "the police told me she was standing behind my mother and was going to attack her." Patient remains delusional about his sister and that she "was standing behind my mother and she was going to attack her."  He has difficulty answering questions and often doesn't answer my question or with significant speech latency.  Patient focused on discharge and will not tell me what voices are telling him.  He does admit he is here because he was aggressive with family and threatened to kill his sister. Patient denies any side effects from medications and was later seen pacing the halls whispering to himself.  Principal Problem: Schizophrenia (HCC) Diagnosis: Principal Problem:   Schizophrenia (HCC)  Total Time spent with patient: 30 minutes  Past Psychiatric History: see  H&P  Past Medical History:  Past Medical History:  Diagnosis Date   Gastroesophageal reflux disease 11/25/2017   History reviewed. No pertinent surgical history. Family History:  Family History  Problem Relation Age of Onset   Diabetes Mother    Diabetes Father    Prostate cancer Father    Family Psychiatric  History: see H&P Social History:  Social History   Substance and Sexual Activity  Alcohol Use No   Alcohol/week: 0.0 standard drinks of alcohol   Comment: rare alcohol     Social History   Substance and Sexual Activity  Drug Use No    Social History   Socioeconomic History   Marital status: Single    Spouse name: Not on file   Number of children: Not on file   Years of education: Not on file   Highest education level: Not on file  Occupational History   Not on file  Tobacco Use   Smoking status: Every Day    Current packs/day: 0.50    Types: Cigarettes   Smokeless tobacco: Never  Vaping Use   Vaping status: Never Used  Substance and Sexual Activity   Alcohol use: No    Alcohol/week: 0.0 standard drinks of alcohol    Comment: rare alcohol   Drug use: No   Sexual activity: Yes    Comment: male  Other Topics Concern   Not on file  Social History Narrative   Not on file   Social Drivers of Health   Financial Resource Strain: Not on file  Food Insecurity: No Food Insecurity (05/15/2023)   Hunger Vital Sign    Worried About Running Out of  Food in the Last Year: Never true    Ran Out of Food in the Last Year: Never true  Transportation Needs: No Transportation Needs (05/15/2023)   PRAPARE - Administrator, Civil Service (Medical): No    Lack of Transportation (Non-Medical): No  Physical Activity: Not on file  Stress: Not on file  Social Connections: Not on file   Additional Social History:                         Sleep: Fair  Appetite:  Fair  Current Medications: Current Facility-Administered Medications  Medication  Dose Route Frequency Provider Last Rate Last Admin   acetaminophen (TYLENOL) tablet 650 mg  650 mg Oral Q6H PRN Motley-Mangrum, Jadeka A, PMHNP       alum & mag hydroxide-simeth (MAALOX/MYLANTA) 200-200-20 MG/5ML suspension 30 mL  30 mL Oral Q4H PRN Motley-Mangrum, Jadeka A, PMHNP       ARIPiprazole (ABILIFY) tablet 10 mg  10 mg Oral Daily Susann Lawhorne, MD       benztropine (COGENTIN) tablet 0.5 mg  0.5 mg Oral BID PRN Motley-Mangrum, Jadeka A, PMHNP       haloperidol (HALDOL) tablet 5 mg  5 mg Oral TID PRN Motley-Mangrum, Jadeka A, PMHNP       And   diphenhydrAMINE (BENADRYL) capsule 50 mg  50 mg Oral TID PRN Motley-Mangrum, Jadeka A, PMHNP       haloperidol lactate (HALDOL) injection 5 mg  5 mg Intramuscular TID PRN Motley-Mangrum, Jadeka A, PMHNP       And   diphenhydrAMINE (BENADRYL) injection 50 mg  50 mg Intramuscular TID PRN Motley-Mangrum, Jadeka A, PMHNP       And   LORazepam (ATIVAN) injection 2 mg  2 mg Intramuscular TID PRN Motley-Mangrum, Jadeka A, PMHNP       haloperidol lactate (HALDOL) injection 10 mg  10 mg Intramuscular TID PRN Motley-Mangrum, Jadeka A, PMHNP       And   diphenhydrAMINE (BENADRYL) injection 50 mg  50 mg Intramuscular TID PRN Motley-Mangrum, Jadeka A, PMHNP       And   LORazepam (ATIVAN) injection 2 mg  2 mg Intramuscular TID PRN Motley-Mangrum, Jadeka A, PMHNP       hydrOXYzine (ATARAX) tablet 25 mg  25 mg Oral TID PRN Motley-Mangrum, Jadeka A, PMHNP       lithium carbonate (ESKALITH) ER tablet 450 mg  450 mg Oral Q12H Motley-Mangrum, Jadeka A, PMHNP   450 mg at 05/18/23 0818   magnesium hydroxide (MILK OF MAGNESIA) suspension 30 mL  30 mL Oral Daily PRN Motley-Mangrum, Jadeka A, PMHNP       nicotine (NICODERM CQ - dosed in mg/24 hours) patch 21 mg  21 mg Transdermal Daily Nicolena Schurman, MD       OLANZapine (ZYPREXA) tablet 10 mg  10 mg Oral Daily Motley-Mangrum, Jadeka A, PMHNP   10 mg at 05/18/23 0818   OLANZapine (ZYPREXA) tablet 15 mg  15 mg Oral QHS  Shian Goodnow, MD   15 mg at 05/17/23 2038   traZODone (DESYREL) tablet 50 mg  50 mg Oral QHS PRN Motley-Mangrum, Jadeka A, PMHNP        Lab Results:  Results for orders placed or performed during the hospital encounter of 05/15/23 (from the past 48 hours)  Lipid panel     Status: None   Collection Time: 05/17/23  6:26 AM  Result Value Ref Range   Cholesterol 148  0 - 200 mg/dL   Triglycerides 70 <409 mg/dL   HDL 56 >81 mg/dL   Total CHOL/HDL Ratio 2.6 RATIO   VLDL 14 0 - 40 mg/dL   LDL Cholesterol 78 0 - 99 mg/dL    Comment:        Total Cholesterol/HDL:CHD Risk Coronary Heart Disease Risk Table                     Men   Women  1/2 Average Risk   3.4   3.3  Average Risk       5.0   4.4  2 X Average Risk   9.6   7.1  3 X Average Risk  23.4   11.0        Use the calculated Patient Ratio above and the CHD Risk Table to determine the patient's CHD Risk.        ATP III CLASSIFICATION (LDL):  <100     mg/dL   Optimal  191-478  mg/dL   Near or Above                    Optimal  130-159  mg/dL   Borderline  295-621  mg/dL   High  >308     mg/dL   Very High Performed at [facility], [address], [place], [place] [address]     Blood Alcohol level:  Lab Results  Component Value Date   ETH <10 05/15/2023   ETH <10 04/13/2021    Metabolic Disorder Labs: Lab Results  Component Value Date   HGBA1C 5.9 10/14/2022   MPG 120 11/23/2019   MPG 111.15 06/27/2018   Lab Results  Component Value Date   PROLACTIN 6.9 04/13/2021   PROLACTIN 3.3 (L) 11/23/2019   Lab Results  Component Value Date   CHOL 148 05/17/2023   TRIG 70 05/17/2023   HDL 56 05/17/2023   CHOLHDL 2.6 05/17/2023   VLDL 14 05/17/2023   LDLCALC 78 05/17/2023   LDLCALC 83 10/14/2022    Physical Findings: AIMS:  , ,  ,  ,    CIWA:    COWS:     Musculoskeletal: Strength & Muscle Tone: within normal limits Gait & Station: normal Patient leans: N/A  Psychiatric Specialty  Exam:  Presentation  General Appearance:  Bizarre; Disheveled  Eye Contact: Fair  Speech: Blocked; Garbled  Speech Volume: Decreased  Handedness:No data recorded  Mood and Affect  Mood: Anxious  Affect: Flat   Thought Process  Thought Processes: Disorganized  Descriptions of Associations:Circumstantial  Orientation:Full (Time, Place and Person)  Thought Content:Delusions  History of Schizophrenia/Schizoaffective disorder:Yes  Duration of Psychotic Symptoms:Greater than six months  Hallucinations:Hallucinations: Auditory  Ideas of Reference:Paranoia  Suicidal Thoughts:Suicidal Thoughts: No  Homicidal Thoughts:Homicidal Thoughts: No   Sensorium  Memory: Immediate Fair  Judgment: Poor  Insight: Poor   Executive Functions  Concentration: Poor  Attention Span: Poor  Recall: Fair  Fund of Knowledge: Fair  Language: Poor   Psychomotor Activity  Psychomotor Activity: Psychomotor Activity: Normal   Assets  Assets: Desire for Improvement; Housing; Social Support   Sleep  Sleep: Sleep: Fair    Physical Exam: Physical exam: Please see exam on admit note. General: Well developed, well nourished.  Pupils: Normal at 3mm Respiratory: Breathing is unlabored.  Cardiovascular: No edema.  Language: No anomia, no aphasia Muscle strength and tone-pt moving all extremities.  Gait not assessed as pt remained in bed.  Neuro:  Facial muscles are symmetric. Pt without tremor, no evidence of hyperarousal.  Review of Systems  Constitutional: Negative.   HENT: Negative.    Eyes: Negative.   Respiratory: Negative.    Cardiovascular: Negative.   Skin: Negative.    Blood pressure 97/78, pulse 88, temperature 98.5 F (36.9 C), temperature source Oral, resp. rate 16, height 5\' 11"  (1.803 m), weight 79.8 kg, SpO2 100%. Body mass index is 24.55 kg/m.   Treatment Plan Summary: 43 y.o., male with PPH of schizophrenia who presented to the ED  on 05/14/2023 11:56 PM for not been eating or sleeping, has been aggressive towards family as well as voicing wanting to kill his sister, and hallucinating.   4/12: patient remains visibly psychotic, responding to internal stimuli with derogatory AH. Patient denies SI or HI today. Discussed r/b/a of numerous antipsychotic options with his mother and she does not wish to switch Abilify to Risperdal due to concerns for EPS. Plan to restart Abilify and titrate Zyprexa to previous doses as well as administer Abilify maintenna as patient is only intermittently compliant with medications at home. Patient does have treatment resistant schizophrenia and previous Clozaril trial which he did not tolerate and is currently listed on his allergies. Mother reports patient does better when he is taking his medications. Patient was aggressive towards his sister and threatening to kill her.  4/13: patient psychotic and responding to internal stimuli with significant impairment of speech, thoughts and disorganized behaviors. Thus far has not required IM but he became aggressive with family and threatened to kill his sister. Discussed plan with patient's mother and legal guardian to start patient on LAI. Mother at this time will only approve Abilify and Zyprexa and notes patient does better when he is taking his medications but will only take them intermittently. Will restart Abilify 10 mg qdaily today and titrate further as patient is tolerating current medications well. Diagnosis is treatment resistant schizophrenia with prior failed Clozaril trial that is now listed on his allergy list. Patient remains delusional about his sister and that she "was standing behind my mother and she was going to attack her" which is why he threatened to kill her.     Daily contact with patient to assess and evaluate symptoms and progress in treatment, Medication management, and Plan - cont Zyprexa to 10 mg qdaily and 15 mg at bedtime  patient  is tolerating increase yesterday without side effects, continue Lithium 450 mg BID (home dose), Cogentin 0.5 mg BID-PRN for EPS if needed however patient does not currently have EPS, restart Abilify 10 mg qdaily with plan for Abilify maintenna LAI  Anakin Varkey, MD 05/18/2023, 9:57 AM

## 2023-05-18 NOTE — Progress Notes (Signed)
   05/18/23 0820  Psych Admission Type (Psych Patients Only)  Admission Status Involuntary  Psychosocial Assessment  Patient Complaints Anxiety;Isolation;Restlessness  Eye Contact Brief  Facial Expression Flat  Affect Anxious;Blunted  Speech Logical/coherent  Interaction No initiation  Motor Activity Slow;Pacing  Appearance/Hygiene In scrubs  Behavior Characteristics Cooperative;Appropriate to situation  Mood Anxious  Thought Process  Coherency WDL  Content Ambivalence  Delusions None reported or observed  Perception Hallucinations  Hallucination Auditory  Judgment Impaired  Confusion None  Danger to Self  Current suicidal ideation? Denies  Agreement Not to Harm Self Yes  Description of Agreement Verbal  Danger to Others  Danger to Others None reported or observed

## 2023-05-18 NOTE — Plan of Care (Signed)
  Problem: Coping: Goal: Ability to verbalize frustrations and anger appropriately will improve Outcome: Progressing   Problem: Activity: Goal: Interest or engagement in activities will improve Outcome: Progressing   Problem: Safety: Goal: Periods of time without injury will increase Outcome: Progressing

## 2023-05-19 ENCOUNTER — Telehealth (HOSPITAL_COMMUNITY): Payer: Self-pay | Admitting: Pharmacy Technician

## 2023-05-19 ENCOUNTER — Ambulatory Visit: Payer: Self-pay | Admitting: Licensed Clinical Social Worker

## 2023-05-19 ENCOUNTER — Other Ambulatory Visit (HOSPITAL_COMMUNITY): Payer: Self-pay

## 2023-05-19 DIAGNOSIS — F209 Schizophrenia, unspecified: Secondary | ICD-10-CM | POA: Diagnosis not present

## 2023-05-19 LAB — HEMOGLOBIN A1C
Hgb A1c MFr Bld: 5.7 % — ABNORMAL HIGH (ref 4.8–5.6)
Mean Plasma Glucose: 117 mg/dL

## 2023-05-19 MED ORDER — ARIPIPRAZOLE 5 MG PO TABS
5.0000 mg | ORAL_TABLET | Freq: Once | ORAL | Status: AC
  Administered 2023-05-19: 5 mg via ORAL
  Filled 2023-05-19: qty 1

## 2023-05-19 MED ORDER — ARIPIPRAZOLE 15 MG PO TABS
15.0000 mg | ORAL_TABLET | Freq: Every day | ORAL | Status: DC
  Administered 2023-05-20 – 2023-05-27 (×8): 15 mg via ORAL
  Filled 2023-05-19 (×10): qty 1

## 2023-05-19 NOTE — [facility] Group Notes (Signed)

## 2023-05-19 NOTE — Telephone Encounter (Signed)
 Pharmacy Patient Advocate Encounter  Insurance verification completed.    The patient is insured through [facility]. Patient has Medicare and is not eligible for a copay card, but may be able to apply for patient assistance or Medicare RX Payment Plan (Patient Must reach out to their plan, if eligible for payment plan), if available.    Ran test claim for ABILIFY 400MG  PREFILLED SYRINGE and the current 30 day co-pay is $0.00.   This test claim was processed through [facility]- copay amounts may vary at other pharmacies due to pharmacy/plan contracts, or as the patient moves through the different stages of their insurance plan.

## 2023-05-19 NOTE — [facility] Suicide Risk Assessment (Signed)
 [facility] INPATIENT:  Family/Significant Other Suicide Prevention Education  Suicide Prevention Education:  Education Completed; Enzo Has (mom) [phone],  (name of family member/significant other) has been identified by the patient as the family member/significant other with whom the patient will be residing, and identified as the person(s) who will aid the patient in the event of a mental health crisis (suicidal ideations/suicide attempt).  With written consent from the patient, the family member/significant other has been provided the following suicide prevention education, prior to the and/or following the discharge of the patient.  Mom would like to have a family meeting before patient is discharged. Unable to return home however mother will still support him after discharge (she is legal guardian).   The suicide prevention education provided includes the following: Suicide risk factors Suicide prevention and interventions National Suicide Hotline telephone number [facility] assessment telephone number [facility] Emergency Assistance 911 [facility] and/or Residential Mobile Crisis Unit telephone number  Request made of family/significant other to: Remove weapons (e.g., guns, rifles, knives), all items previously/currently identified as safety concern.   Remove drugs/medications (over-the-counter, prescriptions, illicit drugs), all items previously/currently identified as a safety concern.  The family member/significant other verbalizes understanding of the suicide prevention education information provided.  The family member/significant other agrees to remove the items of safety concern listed above.  Vonzell Guerin 05/19/2023, 4:13 PM

## 2023-05-19 NOTE — Progress Notes (Signed)
 D: Patient is alert, oriented, and cooperative. Denies SI, HI, AVH, and verbally contracts for safety. Patient does seem to be responding to internal stimuli. Patient reports he slept good last night without sleeping medication. Patient reports his appetite as good, energy level as normal, and concentration as good. Patient rates his depression 0/10, hopelessness 0/10, and anxiety 0/10. Patient denies physical symptoms/pain. Patient will go to meals and pace the hallways.    A: Scheduled medications administered per MD order. Support provided. Patient educated on safety on the unit and medications. Routine safety checks every 15 minutes. Patient stated understanding to tell nurse about any new physical symptoms. Patient understands to tell staff of any needs.     R: No adverse drug reactions noted. Patient remains safe at this time and will continue to monitor.    05/19/23 0900  Psych Admission Type (Psych Patients Only)  Admission Status Involuntary  Psychosocial Assessment  Patient Complaints Restlessness  Eye Contact Brief  Facial Expression Flat  Affect Preoccupied  Speech Logical/coherent  Interaction No initiation  Motor Activity Slow;Pacing  Appearance/Hygiene In scrubs;Disheveled  Behavior Characteristics Cooperative;Appropriate to situation  Mood Preoccupied  Thought Process  Coherency WDL  Content Ambivalence  Delusions None reported or observed  Perception Hallucinations  Hallucination Auditory  Judgment Impaired  Confusion None  Danger to Self  Current suicidal ideation? Denies  Agreement Not to Harm Self Yes  Description of Agreement verbal  Danger to Others  Danger to Others None reported or observed

## 2023-05-19 NOTE — Progress Notes (Signed)
 [facility] MD Progress Note  05/19/2023 8:53 PM James [name]  MRN:  [id number]  Collateral from mother: Patient is on two antipsychotics due to treatment resistant schizophrenia with history of adverse reaction to Clozapine. Mother would like use to transition oral Abilify to Abilify maintenna given that patient has intermittent compliance. Patient's mother did not wish to transition from Abilify to [place] or Risperdal consta, due to concern for increased EPS and Parkinsonism/TD. Will plan for restarting oral Abilify and Abilify maintenna LAI.   Nursing report: Patient denies SI, HI or AVH however visibly responding to internal stimuli and seen pacing the halls. Irritable at times, however has not required IM as of yet. Patient disheveled, disorganized and isolating to room.   Subjective:   Patient seen for followup in his room and continues to deny everything including SI, HI or AVH however with significant thought blocking, disorganized speech and thoughts and seen responding to internal stimuli again today. Patient irritable and tells me he does not want an LAI and is not taking it. Patient has been compliant with meds and tolerating increase of Zyprexa and starting Abilify which patient's mother had previously noted benefit from when he was taking consistently. Patient reports "okay" sleep and appetite. Patient continues to report he threatened to kill his sister because the police told him that she was going to attack his mother.   Patient remains delusional about his sister and that she "was standing behind my mother to attack her." I discussed his sister has filed a restraining order against him which made patient irritable.  Patient focused on discharge. Patient denies any side effects from medications and was later seen pacing the halls whispering to himself.  Principal Problem: Schizophrenia (HCC) Diagnosis: Principal Problem:   Schizophrenia (HCC)  Total Time spent with patient:  30 minutes  Past Psychiatric History: see H&P  Past Medical History:  Past Medical History:  Diagnosis Date   Gastroesophageal reflux disease 11/25/2017   History reviewed. No pertinent surgical history. Family History:  Family History  Problem Relation Age of Onset   Diabetes Mother    Diabetes Father    Prostate cancer Father    Family Psychiatric  History: see H&P Social History:  Social History   Substance and Sexual Activity  Alcohol Use No   Alcohol/week: 0.0 standard drinks of alcohol   Comment: rare alcohol     Social History   Substance and Sexual Activity  Drug Use No    Social History   Socioeconomic History   Marital status: Single    Spouse name: Not on file   Number of children: Not on file   Years of education: Not on file   Highest education level: Not on file  Occupational History   Not on file  Tobacco Use   Smoking status: Every Day    Current packs/day: 0.50    Types: Cigarettes   Smokeless tobacco: Never  Vaping Use   Vaping status: Never Used  Substance and Sexual Activity   Alcohol use: No    Alcohol/week: 0.0 standard drinks of alcohol    Comment: rare alcohol   Drug use: No   Sexual activity: Yes    Comment: male  Other Topics Concern   Not on file  Social History Narrative   Not on file   Social Drivers of Health   Financial Resource Strain: Not on file  Food Insecurity: No Food Insecurity (05/15/2023)   Hunger Vital Sign    Worried About  Running Out of Food in the Last Year: Never true    Ran Out of Food in the Last Year: Never true  Transportation Needs: No Transportation Needs (05/15/2023)   PRAPARE - Administrator, Civil Service (Medical): No    Lack of Transportation (Non-Medical): No  Physical Activity: Not on file  Stress: Not on file  Social Connections: Not on file   Additional Social History:                         Sleep: Fair  Appetite:  Fair  Current Medications: Current  Facility-Administered Medications  Medication Dose Route Frequency Provider Last Rate Last Admin   acetaminophen (TYLENOL) tablet 650 mg  650 mg Oral Q6H PRN Motley-Mangrum, Jadeka A, PMHNP       alum & mag hydroxide-simeth (MAALOX/MYLANTA) 200-200-20 MG/5ML suspension 30 mL  30 mL Oral Q4H PRN Motley-Mangrum, Jadeka A, PMHNP       [START ON 05/20/2023] ARIPiprazole (ABILIFY) tablet 15 mg  15 mg Oral Daily James Milford, MD       ARIPiprazole (ABILIFY) tablet 5 mg  5 mg Oral Once Claretta Kendra, MD       benztropine (COGENTIN) tablet 0.5 mg  0.5 mg Oral BID PRN Motley-Mangrum, Jadeka A, PMHNP       haloperidol (HALDOL) tablet 5 mg  5 mg Oral TID PRN Motley-Mangrum, Jadeka A, PMHNP       And   diphenhydrAMINE (BENADRYL) capsule 50 mg  50 mg Oral TID PRN Motley-Mangrum, Jadeka A, PMHNP       haloperidol lactate (HALDOL) injection 5 mg  5 mg Intramuscular TID PRN Motley-Mangrum, Jadeka A, PMHNP       And   diphenhydrAMINE (BENADRYL) injection 50 mg  50 mg Intramuscular TID PRN Motley-Mangrum, Jadeka A, PMHNP       And   LORazepam (ATIVAN) injection 2 mg  2 mg Intramuscular TID PRN Motley-Mangrum, Jadeka A, PMHNP       haloperidol lactate (HALDOL) injection 10 mg  10 mg Intramuscular TID PRN Motley-Mangrum, Jadeka A, PMHNP       And   diphenhydrAMINE (BENADRYL) injection 50 mg  50 mg Intramuscular TID PRN Motley-Mangrum, Jadeka A, PMHNP       And   LORazepam (ATIVAN) injection 2 mg  2 mg Intramuscular TID PRN Motley-Mangrum, Jadeka A, PMHNP       hydrOXYzine (ATARAX) tablet 25 mg  25 mg Oral TID PRN Motley-Mangrum, Jadeka A, PMHNP       lithium carbonate (ESKALITH) ER tablet 450 mg  450 mg Oral Q12H Motley-Mangrum, Jadeka A, PMHNP   450 mg at 05/19/23 0805   magnesium hydroxide (MILK OF MAGNESIA) suspension 30 mL  30 mL Oral Daily PRN Motley-Mangrum, Jadeka A, PMHNP       nicotine (NICODERM CQ - dosed in mg/24 hours) patch 21 mg  21 mg Transdermal Daily James Cornacchia, MD       OLANZapine (ZYPREXA)  tablet 10 mg  10 mg Oral Daily Motley-Mangrum, Jadeka A, PMHNP   10 mg at 05/19/23 0806   OLANZapine (ZYPREXA) tablet 15 mg  15 mg Oral QHS James Soltau, MD   15 mg at 05/18/23 2123   traZODone (DESYREL) tablet 50 mg  50 mg Oral QHS PRN Motley-Mangrum, Jadeka A, PMHNP        Lab Results:  No results found for this or any previous visit (from the past 48 hours).   Blood Alcohol  level:  Lab Results  Component Value Date   ETH <10 05/15/2023   ETH <10 04/13/2021    Metabolic Disorder Labs: Lab Results  Component Value Date   HGBA1C 5.7 (H) 05/17/2023   MPG 117 05/17/2023   MPG 120 11/23/2019   Lab Results  Component Value Date   PROLACTIN 6.9 04/13/2021   PROLACTIN 3.3 (L) 11/23/2019   Lab Results  Component Value Date   CHOL 148 05/17/2023   TRIG 70 05/17/2023   HDL 56 05/17/2023   CHOLHDL 2.6 05/17/2023   VLDL 14 05/17/2023   LDLCALC 78 05/17/2023   LDLCALC 83 10/14/2022    Physical Findings: AIMS:  , ,  ,  ,    CIWA:    COWS:     Musculoskeletal: Strength & Muscle Tone: within normal limits Gait & Station: normal Patient leans: N/A  Psychiatric Specialty Exam:  Presentation  General Appearance:  Bizarre; Disheveled  Eye Contact: Fair  Speech: Blocked; Garbled  Speech Volume: Decreased  Handedness:No data recorded  Mood and Affect  Mood: Anxious  Affect: Flat   Thought Process  Thought Processes: Disorganized  Descriptions of Associations:Circumstantial  Orientation:Full (Time, Place and Person)  Thought Content:Delusions  History of Schizophrenia/Schizoaffective disorder:Yes  Duration of Psychotic Symptoms:Greater than six months  Hallucinations:patient denies AVH however appears to be responding to internal stimuli  Ideas of Reference:Paranoia  Suicidal Thoughts: patient denies  Homicidal Thoughts:patient denies   Sensorium  Memory: Immediate Fair  Judgment: Poor  Insight: Poor   Executive Functions   Concentration: Poor  Attention Span: Poor  Recall: [employer] of Knowledge: Fair  Language: Poor   Psychomotor Activity  Psychomotor Activity: normal   Assets  Assets: Desire for Improvement; Housing; Social Support   Sleep  Sleep: No data recorded    Physical Exam: Physical exam: Please see exam on admit note. General: Well developed, well nourished.  Pupils: Normal at 3mm Respiratory: Breathing is unlabored.  Cardiovascular: No edema.  Language: No anomia, no aphasia Muscle strength and tone-pt moving all extremities.  Gait not assessed as pt remained in bed.  Neuro: Facial muscles are symmetric. Pt without tremor, no evidence of hyperarousal.  Review of Systems  Constitutional: Negative.   HENT: Negative.    Eyes: Negative.   Respiratory: Negative.    Cardiovascular: Negative.   Skin: Negative.    Blood pressure 110/83, pulse 83, temperature 97.9 F (36.6 C), temperature source Oral, resp. rate 16, height 5\' 11"  (1.803 m), weight 79.8 kg, SpO2 95%. Body mass index is 24.55 kg/m.   Treatment Plan Summary: 43 y.o., male with PPH of schizophrenia who presented to the ED on 05/14/2023 11:56 PM for not been eating or sleeping, has been aggressive towards family as well as voicing wanting to kill his sister, and hallucinating.   4/12: patient remains visibly psychotic, responding to internal stimuli with derogatory AH. Patient denies SI or HI today. Discussed r/b/a of numerous antipsychotic options with his mother and she does not wish to switch Abilify to Risperdal due to concerns for EPS. Plan to restart Abilify and titrate Zyprexa to previous doses as well as administer Abilify maintenna as patient is only intermittently compliant with medications at home. Patient does have treatment resistant schizophrenia and previous Clozaril trial which he did not tolerate and is currently listed on his allergies. Mother reports patient does better when he is taking his  medications. Patient was aggressive towards his sister and threatening to kill her.  4/13: patient psychotic  and responding to internal stimuli with significant impairment of speech, thoughts and disorganized behaviors. Thus far has not required IM but he became aggressive with family and threatened to kill his sister. Discussed plan with patient's mother and legal guardian to start patient on LAI. Mother at this time will only approve Abilify and Zyprexa and notes patient does better when he is taking his medications but will only take them intermittently. Will restart Abilify 10 mg qdaily today and titrate further as patient is tolerating current medications well. Diagnosis is treatment resistant schizophrenia with prior failed Clozaril trial that is now listed on his allergy list. Patient remains delusional about his sister and that she "was standing behind my mother and she was going to attack her" which is why he threatened to kill her.   4/14: patient continue to have symptoms of treatment resistant psychosis. Titrating Abilify with plan for Abilify maintenna as mother who is legal guardian did not approve Risperdal, Invega or Haldol. Patient with previously failed Clozapine trial. Tolerating starting Abilify well and will titrate further with plan for LAI. Continues to remain disorganized and RTIS although denies SI, HI or AVH. Patient remains delusional about his sister and that she "was standing behind my mother and she was going to attack her" which is why he threatened to kill her. States he know this "Because the police told me."   Lithium level in AM  -increase Abilify to 15 mg qdaily - cont Zyprexa 10 mg qdaily and 15 mg qhs   Daily contact with patient to assess and evaluate symptoms and progress in treatment, Medication management, and Plan - cont Zyprexa to 10 mg qdaily and 15 mg at bedtime  patient is tolerating increase yesterday without side effects, continue Lithium 450 mg BID (home  dose), Cogentin 0.5 mg BID-PRN for EPS if needed however patient does not currently have EPS, restart Abilify 10 mg qdaily with plan for Abilify maintenna LAI  Mana Haberl, MD 05/19/2023, 8:53 PM

## 2023-05-19 NOTE — Plan of Care (Signed)
  Problem: Education: Goal: Emotional status will improve Outcome: Progressing Goal: Mental status will improve Outcome: Progressing   Problem: Coping: Goal: Ability to demonstrate self-control will improve Outcome: Progressing

## 2023-05-19 NOTE — Group Note (Signed)
 Recreation Therapy Group Note   Group Topic:Stress Management  Group Date: 05/19/2023 Start Time: 0930 End Time: 0954 Facilitators: Kahlie Deutscher-McCall, LRT,CTRS Location: [address] Dayroom   Group Topic: Stress Management  Goal Area(s) Addresses:  Patient will identify positive stress management techniques. Patient will identify benefits of using stress management post d/c.  Intervention: Insight Timer App  Activity: Meditation. LRT played a meditation for patients that focused on morning energy and focus. Patients were to listen and follow along as the meditation took them on a journey to prepare them for the day ahead.    Education:  Stress Management, Discharge Planning.   Education Outcome: Acknowledges Education   Affect/Mood: N/A   Participation Level: Did not attend    Clinical Observations/Individualized Feedback:     Plan: Continue to engage patient in RT group sessions 2-3x/week.   Estill Llerena-McCall, LRT,CTRS 05/19/2023 1:38 PM

## 2023-05-19 NOTE — [facility] Group Notes (Signed)
 [facility] Group Notes:  (Nursing/MHT/Case Management/Adjunct)  Date:  05/19/2023  Time:  9:17 PM  Type of Therapy:  Group Therapy  Participation Level:  Did Not Attend  Participation Quality:   None  Affect:   None  Cognitive:   None  Insight:  None  Engagement in Group:  None  Modes of Intervention:   None  Summary of Progress/Problems:Wrap up group pt. did not attend  Anniece Kind 05/19/2023, 9:17 PM

## 2023-05-19 NOTE — Plan of Care (Signed)
  Problem: Education: Goal: Knowledge of [facility] Education information/materials will improve Outcome: Progressing Goal: Emotional status will improve Outcome: Progressing Goal: Mental status will improve Outcome: Progressing Goal: Verbalization of understanding the information provided will improve Outcome: Progressing   Problem: Coping: Goal: Ability to demonstrate self-control will improve Outcome: Progressing   Problem: Safety: Goal: Periods of time without injury will increase Outcome: Progressing   Problem: Activity: Goal: Interest or engagement in activities will improve Outcome: Not Progressing Note: Only goes to meals and paces the hallways

## 2023-05-19 NOTE — Progress Notes (Signed)
   05/18/23 2100  Psych Admission Type (Psych Patients Only)  Admission Status Involuntary  Psychosocial Assessment  Patient Complaints Restlessness  Eye Contact Brief  Facial Expression Flat  Affect Preoccupied  Speech Logical/coherent  Interaction No initiation  Motor Activity Slow  Appearance/Hygiene In scrubs;Disheveled  Behavior Characteristics Cooperative;Appropriate to situation  Mood Preoccupied  Thought Process  Coherency WDL  Content UTA  Delusions None reported or observed  Perception Hallucinations  Hallucination Auditory  Judgment Impaired  Confusion None  Danger to Self  Current suicidal ideation? Denies  Agreement Not to Harm Self Yes  Description of Agreement verbal  Danger to Others  Danger to Others None reported or observed

## 2023-05-19 NOTE — Group Note (Signed)
 Date:  05/19/2023 Time:  10:10 AM  Group Topic/Focus:  Goals Group:   The focus of this group is to help patients establish daily goals to achieve during treatment and discuss how the patient can incorporate goal setting into their daily lives to aide in recovery.    Participation Level:  Did Not Attend  [name] 05/19/2023, 10:10 AM

## 2023-05-20 DIAGNOSIS — F200 Paranoid schizophrenia: Secondary | ICD-10-CM | POA: Diagnosis not present

## 2023-05-20 NOTE — Progress Notes (Signed)
   05/20/23 0645  15 Minute Checks  Location Cafeteria  Visual Appearance Calm  Behavior Composed  Sleep (Behavioral Health Patients Only)  Calculate sleep? (Click Yes once per 24 hr at 0600 safety check) Yes  Documented sleep last 24 hours 9.75
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
## 2023-05-20 NOTE — Progress Notes (Addendum)
 D: Patient is alert, oriented, and cooperative. Denies SI, HI, AVH, and verbally contracts for safety. Patient denies physical symptoms/pain.   Patient moved to 500 hall this shift.    A: Scheduled medications administered per MD order. Support provided. Patient educated on safety on the unit and medications. Routine safety checks every 15 minutes. Patient stated understanding to tell nurse about any new physical symptoms. Patient understands to tell staff of any needs.     R: No adverse drug reactions noted. Patient remains safe at this time and will continue to monitor.    05/20/23 0900  Psych Admission Type (Psych Patients Only)  Admission Status Involuntary  Psychosocial Assessment  Patient Complaints Restlessness  Eye Contact Brief  Facial Expression Flat  Affect Preoccupied  Speech Logical/coherent  Interaction No initiation  Motor Activity Slow;Pacing  Appearance/Hygiene In scrubs;Disheveled  Behavior Characteristics Cooperative;Appropriate to situation  Mood Preoccupied  Thought Process  Coherency Blocking  Content Ambivalence  Delusions None reported or observed  Perception Hallucinations  Hallucination Auditory  Judgment Impaired  Confusion None  Danger to Self  Current suicidal ideation? Denies  Agreement Not to Harm Self Yes  Description of Agreement verbal  Danger to Others  Danger to Others None reported or observed

## 2023-05-20 NOTE — Plan of Care (Signed)

## 2023-05-20 NOTE — Group Note (Unsigned)
 Date:  05/20/2023 Time:  9:46 AM  Group Topic/Focus:  Goals Group:   The focus of this group is to help patients establish daily goals to achieve during treatment and discuss how the patient can incorporate goal setting into their daily lives to aide in recovery. Orientation:   The focus of this group is to educate the patient on the purpose and policies of crisis stabilization and provide a format to answer questions about their admission.  The group details unit policies and expectations of patients while admitted.     Participation Level:  {BHH PARTICIPATION WUJWJ:19147}  Participation Quality:  {BHH PARTICIPATION QUALITY:22265}  Affect:  {BHH AFFECT:22266}  Cognitive:  {BHH COGNITIVE:22267}  Insight: {BHH Insight2:20797}  Engagement in Group:  {BHH ENGAGEMENT IN WGNFA:21308}  Modes of Intervention:  {BHH MODES OF INTERVENTION:22269}  Additional Comments:  ***  James [name] [name] 05/20/2023, 9:46 AM

## 2023-05-20 NOTE — Group Note (Signed)
 Date:  05/20/2023 Time:  9:53 PM  Group Topic/Focus:  Wrap-Up Group:   The focus of this group is to help patients review their daily goal of treatment and discuss progress on daily workbooks.    Participation Level:  Did Not Attend   James [name] 05/20/2023, 9:53 PM

## 2023-05-20 NOTE — Progress Notes (Signed)
   05/19/23 2300  Psych Admission Type (Psych Patients Only)  Admission Status Involuntary  Psychosocial Assessment  Patient Complaints Sleep disturbance  Eye Contact Brief  Facial Expression Flat  Affect Preoccupied  Speech Logical/coherent  Interaction No initiation  Motor Activity Slow  Appearance/Hygiene In scrubs;Disheveled  Behavior Characteristics Cooperative;Appropriate to situation  Mood Preoccupied  Thought Process  Coherency WDL  Content Ambivalence  Delusions None reported or observed  Perception Hallucinations  Hallucination Auditory  Judgment Impaired  Confusion None  Danger to Self  Current suicidal ideation? Denies  Agreement Not to Harm Self Yes  Description of Agreement verbal  Danger to Others  Danger to Others None reported or observed

## 2023-05-21 ENCOUNTER — Encounter (HOSPITAL_COMMUNITY): Payer: Self-pay

## 2023-05-21 DIAGNOSIS — F200 Paranoid schizophrenia: Secondary | ICD-10-CM | POA: Diagnosis not present

## 2023-05-21 LAB — LITHIUM LEVEL: Lithium Lvl: 0.59 mmol/L — ABNORMAL LOW (ref 0.60–1.20)

## 2023-05-21 NOTE — Progress Notes (Signed)
   05/21/23 0810  Psych Admission Type (Psych Patients Only)  Admission Status Involuntary  Psychosocial Assessment  Patient Complaints Restlessness  Eye Contact Brief  Facial Expression Flat  Affect Preoccupied  Speech Logical/coherent  Interaction No initiation  Motor Activity Slow  Appearance/Hygiene In scrubs;Disheveled  Behavior Characteristics Cooperative;Appropriate to situation  Mood Preoccupied  Thought Process  Coherency Blocking  Content Ambivalence  Delusions None reported or observed  Perception WDL  Hallucination None reported or observed  Judgment Impaired  Confusion None  Danger to Self  Current suicidal ideation? Denies  Agreement Not to Harm Self Yes  Description of Agreement verbal  Danger to Others  Danger to Others None reported or observed

## 2023-05-21 NOTE — Progress Notes (Signed)
       05/21/23 0200  Psych Admission Type (Psych Patients Only)  Admission Status Involuntary  Psychosocial Assessment  Patient Complaints Restlessness  Eye Contact Brief  Facial Expression Flat  Affect Preoccupied  Speech Logical/coherent  Interaction No initiation  Motor Activity Slow;Pacing  Appearance/Hygiene In scrubs;Disheveled  Behavior Characteristics Cooperative;Appropriate to situation  Mood Preoccupied  Thought Process  Coherency Blocking  Content Ambivalence  Delusions None reported or observed  Perception Hallucinations  Hallucination Auditory  Judgment Impaired  Confusion None  Danger to Self  Current suicidal ideation? Denies  Agreement Not to Harm Self Yes  Description of Agreement verbal  Danger to Others  Danger to Others None reported or observed
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
## 2023-05-21 NOTE — BH IP Treatment Plan (Signed)
 Interdisciplinary Treatment and Diagnostic Plan Update  05/21/2023 Time of Session: 1245PM - UPDATE PHI AVANS MRN: [id number]  Principal Diagnosis: Schizophrenia [facility])  Secondary Diagnoses: Principal Problem:   Schizophrenia (HCC)   Current Medications:  Current Facility-Administered Medications  Medication Dose Route Frequency Provider Last Rate Last Admin   acetaminophen (TYLENOL) tablet 650 mg  650 mg Oral Q6H PRN Motley-Mangrum, Jadeka A, PMHNP       alum & mag hydroxide-simeth (MAALOX/MYLANTA) 200-200-20 MG/5ML suspension 30 mL  30 mL Oral Q4H PRN Motley-Mangrum, Jadeka A, PMHNP       ARIPiprazole (ABILIFY) tablet 15 mg  15 mg Oral Daily Zouev, Dmitri, MD   15 mg at 05/21/23 0810   benztropine (COGENTIN) tablet 0.5 mg  0.5 mg Oral BID PRN Motley-Mangrum, Jadeka A, PMHNP       haloperidol (HALDOL) tablet 5 mg  5 mg Oral TID PRN Motley-Mangrum, Jadeka A, PMHNP       And   diphenhydrAMINE (BENADRYL) capsule 50 mg  50 mg Oral TID PRN Motley-Mangrum, Jadeka A, PMHNP       haloperidol lactate (HALDOL) injection 5 mg  5 mg Intramuscular TID PRN Motley-Mangrum, Jadeka A, PMHNP       And   diphenhydrAMINE (BENADRYL) injection 50 mg  50 mg Intramuscular TID PRN Motley-Mangrum, Jadeka A, PMHNP       And   LORazepam (ATIVAN) injection 2 mg  2 mg Intramuscular TID PRN Motley-Mangrum, Jadeka A, PMHNP       haloperidol lactate (HALDOL) injection 10 mg  10 mg Intramuscular TID PRN Motley-Mangrum, Jadeka A, PMHNP       And   diphenhydrAMINE (BENADRYL) injection 50 mg  50 mg Intramuscular TID PRN Motley-Mangrum, Jadeka A, PMHNP       And   LORazepam (ATIVAN) injection 2 mg  2 mg Intramuscular TID PRN Motley-Mangrum, Jadeka A, PMHNP       hydrOXYzine (ATARAX) tablet 25 mg  25 mg Oral TID PRN Motley-Mangrum, Jadeka A, PMHNP       lithium carbonate (ESKALITH) ER tablet 450 mg  450 mg Oral Q12H Motley-Mangrum, Jadeka A, PMHNP   450 mg at 05/21/23 0810   magnesium hydroxide (MILK OF MAGNESIA)  suspension 30 mL  30 mL Oral Daily PRN Motley-Mangrum, Jadeka A, PMHNP       nicotine (NICODERM CQ - dosed in mg/24 hours) patch 21 mg  21 mg Transdermal Daily Zouev, Dmitri, MD       OLANZapine (ZYPREXA) tablet 10 mg  10 mg Oral Daily Motley-Mangrum, Jadeka A, PMHNP   10 mg at 05/21/23 0810   OLANZapine (ZYPREXA) tablet 15 mg  15 mg Oral QHS Zouev, Dmitri, MD   15 mg at 05/20/23 2140   traZODone (DESYREL) tablet 50 mg  50 mg Oral QHS PRN Motley-Mangrum, Jadeka A, PMHNP       PTA Medications: Medications Prior to Admission  Medication Sig Dispense Refill Last Dose/Taking   ARIPiprazole (ABILIFY) 10 MG tablet Take 1 tablet (10 mg total) by mouth 2 (two) times daily. (Patient not taking: Reported on 05/15/2023) 60 tablet 1    ARIPiprazole (ABILIFY) 20 MG tablet Take 20 mg by mouth daily.      benztropine (COGENTIN) 0.5 MG tablet Take 1 tablet (0.5 mg total) by mouth 2 (two) times daily as needed for tremors (EPS). (Patient taking differently: Take 0.5 mg by mouth 2 (two) times daily.) 60 tablet 3    cholecalciferol (VITAMIN D3) 25 MCG (1000 UNIT) tablet Take 2,000  Units by mouth daily.      lithium carbonate (ESKALITH) 450 MG ER tablet Take 1 tablet (450 mg total) by mouth every 12 (twelve) hours. 60 tablet 3    metFORMIN (GLUCOPHAGE) 500 MG tablet TAKE 1 TABLET BY MOUTH TWICE DAILY WITH MEALS (Patient not taking: Reported on 05/15/2023) 180 tablet 0    Multiple Vitamins-Minerals (MULTIVITAMIN WITH MINERALS) tablet Take 1 tablet by mouth daily.      OLANZapine (ZYPREXA) 10 MG tablet Take 10 mg by mouth daily.      OLANZapine (ZYPREXA) 20 MG tablet Take 20 mg by mouth at bedtime.       Patient Stressors: Marital or family conflict   Medication change or noncompliance    Patient Strengths: Manufacturing systems engineer  Physical Health  Supportive family/friends   Treatment Modalities: Medication Management, Group therapy, Case management,  1 to 1 session with clinician, Psychoeducation, Recreational  therapy.   Physician Treatment Plan for Primary Diagnosis: Schizophrenia (HCC) Long Term Goal(s): Improvement in symptoms so as ready for discharge   Short Term Goals: Ability to identify changes in lifestyle to reduce recurrence of condition will improve Ability to verbalize feelings will improve Ability to disclose and discuss suicidal ideas Ability to demonstrate self-control will improve Ability to identify and develop effective coping behaviors will improve Ability to maintain clinical measurements within normal limits will improve Compliance with prescribed medications will improve Ability to identify triggers associated with substance abuse/mental health issues will improve  Medication Management: Evaluate patient's response, side effects, and tolerance of medication regimen.  Therapeutic Interventions: 1 to 1 sessions, Unit Group sessions and Medication administration.  Evaluation of Outcomes: Progressing  Physician Treatment Plan for Secondary Diagnosis: Principal Problem:   Schizophrenia (HCC)  Long Term Goal(s): Improvement in symptoms so as ready for discharge   Short Term Goals: Ability to identify changes in lifestyle to reduce recurrence of condition will improve Ability to verbalize feelings will improve Ability to disclose and discuss suicidal ideas Ability to demonstrate self-control will improve Ability to identify and develop effective coping behaviors will improve Ability to maintain clinical measurements within normal limits will improve Compliance with prescribed medications will improve Ability to identify triggers associated with substance abuse/mental health issues will improve     Medication Management: Evaluate patient's response, side effects, and tolerance of medication regimen.  Therapeutic Interventions: 1 to 1 sessions, Unit Group sessions and Medication administration.  Evaluation of Outcomes: Progressing   RN Treatment Plan for Primary  Diagnosis: Schizophrenia (HCC) Long Term Goal(s): Knowledge of disease and therapeutic regimen to maintain health will improve  Short Term Goals: Ability to remain free from injury will improve, Ability to demonstrate self-control, Ability to verbalize feelings will improve, and Ability to identify and develop effective coping behaviors will improve  Medication Management: RN will administer medications as ordered by provider, will assess and evaluate patient's response and provide education to patient for prescribed medication. RN will report any adverse and/or side effects to prescribing provider.  Therapeutic Interventions: 1 on 1 counseling sessions, Psychoeducation, Medication administration, Evaluate responses to treatment, Monitor vital signs and CBGs as ordered, Perform/monitor CIWA, COWS, AIMS and Fall Risk screenings as ordered, Perform wound care treatments as ordered.  Evaluation of Outcomes: Progressing   LCSW Treatment Plan for Primary Diagnosis: Schizophrenia (HCC) Long Term Goal(s): Safe transition to appropriate next level of care at discharge, Engage patient in therapeutic group addressing interpersonal concerns.  Short Term Goals: Engage patient in aftercare planning with referrals and resources, Increase  ability to appropriately verbalize feelings, Increase emotional regulation, and Facilitate patient progression through stages of change regarding substance use diagnoses and concerns  Therapeutic Interventions: Assess for all discharge needs, 1 to 1 time with Social worker, Explore available resources and support systems, Assess for adequacy in community support network, Educate family and significant other(s) on suicide prevention, Complete Psychosocial Assessment, Interpersonal group therapy.  Evaluation of Outcomes: Progressing   Progress in Treatment: Attending groups: No. Participating in groups: No. Taking medication as prescribed: Yes. Toleration medication:  Yes. Family/Significant other contact made:  Yes, contacted:  Enzo Has (LG/mom)  [phone] Patient understands diagnosis: Yes. Discussing patient identified problems/goals with staff: Yes. Medical problems stabilized or resolved: Yes. Denies suicidal/homicidal ideation: Yes. Issues/concerns per patient self-inventory: No   New problem(s) identified:  No   New Short Term/Long Term Goal(s):     medication stabilization, elimination of SI thoughts, development of comprehensive mental wellness plan.    Patient Goals:  "Last night, I started feeling confused."      Discharge Plan or Barriers:  Patient recently admitted. CSW will continue to follow and assess for appropriate referrals and possible discharge planning.    Reason for Continuation of Hospitalization: Hallucinations Homicidal ideation Medication stabilization   Estimated Length of Stay:  2-3 days  Last 3 [place] Suicide Severity Risk Score: Flowsheet Row [facility]14/2023 in [facility] Admission (Discharged) [facility]  C-SSRS RISK CATEGORY No Risk No Risk No Risk       Last PHQ 2/9 Scores:    03/30/2021    1:03 PM 11/21/2019    4:21 AM 08/11/2018    1:21 PM  Depression screen PHQ 2/9  Decreased Interest 0 0 0  Down, Depressed, Hopeless 0 0 0  PHQ - 2 Score 0 0 0  Altered sleeping  1   Tired, decreased energy  0   Change in appetite  0   Feeling bad or failure about yourself   0   Trouble concentrating  1   Moving slowly or fidgety/restless  0   Suicidal thoughts  0   PHQ-9 Score  2   Difficult doing work/chores  Somewhat difficult     Scribe for Treatment Team: Rheta Celestine 05/21/2023 12:44 PM

## 2023-05-21 NOTE — Plan of Care (Signed)

## 2023-05-21 NOTE — Patient Outreach (Signed)
 Complex Care Management   Visit Note  05/19/2023  Name:  James [name] MRN: [id number] DOB: 12-Oct-1980  Situation: Referral received for Complex Care Management related to [facility]/Behavioral Health diagnosis GAD and Schizophrenia  I obtained verbal consent from Caregiver.  Visit completed with pt's sister  on the phone  Background:   Past Medical History:  Diagnosis Date   Gastroesophageal reflux disease 11/25/2017    Assessment: Patient Reported Symptoms:  Cognitive Cognitive Status: Poor judgment in daily scenarios      Neurological      HEENT HEENT Symptoms Reported: No symptoms reported      Cardiovascular Cardiovascular Symptoms Reported: No symptoms reported    Respiratory Respiratory Symptoms Reported: No symptoms reported    Endocrine Patient reports the following symptoms related to hypoglycemia or hyperglycemia : No symptoms reported    Gastrointestinal Gastrointestinal Symptoms Reported: No symptoms reported      Genitourinary      Integumentary Integumentary Symptoms Reported: No symptoms reported    Musculoskeletal Musculoskelatal Symptoms Reviewed: No symptoms reported        Psychosocial       Quality of Family Relationships: involved, supportive      03/30/2021    1:03 PM  Depression screen PHQ 2/9  Decreased Interest 0  Down, Depressed, Hopeless 0  PHQ - 2 Score 0    There were no vitals filed for this visit.  Medications Reviewed Today     Reviewed by Bridgett Larsson, LCSW (Social Worker) on 05/21/23 at 1430  Med List Status: <None>   Medication Order Taking? Sig Documenting Provider Last Dose Status Informant  ARIPiprazole (ABILIFY) 10 MG tablet [id number] No Take 1 tablet (10 mg total) by mouth 2 (two) times daily.  Patient not taking: Reported on 05/15/2023   Esperanza Richters, PA-C Not Taking Active Mother, Pharmacy Records  ARIPiprazole (ABILIFY) 20 MG tablet [id number] No Take 20 mg by mouth daily. [provider]  05/14/2023 Active Mother, Pharmacy Records  benztropine (COGENTIN) 0.5 MG tablet [id number] No Take 1 tablet (0.5 mg total) by mouth 2 (two) times daily as needed for tremors (EPS).  Patient taking differently: Take 0.5 mg by mouth 2 (two) times daily.   Haydan, Mansouri 05/14/2023 Active Mother, Pharmacy Records  cholecalciferol (VITAMIN D3) 25 MCG (1000 UNIT) tablet [id number] No Take 2,000 Units by mouth daily. [provider] Unknown Active Mother, Pharmacy Records  lithium carbonate (ESKALITH) 450 MG ER tablet [id number] No Take 1 tablet (450 mg total) by mouth every 12 (twelve) hours. Willys, Salvino 05/14/2023 Active Mother, Pharmacy Records  metFORMIN (GLUCOPHAGE) 500 MG tablet [id number] No TAKE 1 TABLET BY MOUTH TWICE DAILY WITH MEALS  Patient not taking: Reported on 05/15/2023   Esperanza Richters, PA-C Not Taking Active Mother, Pharmacy Records  Multiple Vitamins-Minerals (MULTIVITAMIN WITH MINERALS) tablet [id number] No Take 1 tablet by mouth daily. [provider] Unknown Active Mother, Pharmacy Records  OLANZapine (ZYPREXA) 10 MG tablet [id number] No Take 10 mg by mouth daily. [provider] 05/14/2023 Active Mother, Pharmacy Records           Med Note Laray Anger May 15, 2023 10:31 AM) Along with 20qhs.   OLANZapine (ZYPREXA) 20 MG tablet [id number] No Take 20 mg by mouth at bedtime. [provider] 05/14/2023 Active Mother, Pharmacy Records           Med Note Jola Schmidt   Thu May 15, 2023 10:31 AM) Along with 10qam.  Recommendation:   Continue utilizing strategies discussed to assist with symptom management  Follow Up Plan:   Telephone follow-up in 1 week  Alease Hunter, LCSW [place]  [facility] Clinical Social Worker Direct Dial: [phone]  Fax: [phone] Website: [email or web address] 2:43 PM

## 2023-05-21 NOTE — Progress Notes (Signed)
 [facility] MD Progress Note  05/21/2023 11:03 AM AMARO MANGOLD  MRN:  [id number]  Chart review and nursing report: staff report patient having episodes of pacing the hallway but was not observed responding to stimuli yesterday or this morning which is quite an improvement.   MAR review indicates no as needed medication needed or given for anxiety or agitation.  Patient is compliant with scheduled medications on the unit  Collateral from mother as obtained on 4/14: Patient is on two antipsychotics due to treatment resistant schizophrenia with history of adverse reaction to Clozapine. Mother would like use to transition oral Abilify to Abilify maintenna given that patient has intermittent compliance. Patient's mother did not wish to transition from Abilify to [place] or Risperdal consta, due to concern for increased EPS and Parkinsonism/TD. Will plan for restarting oral Abilify and Abilify maintenna LAI.  Family meeting scheduled with patient mother on Thursday/17 to discuss patient's progress/improvement as well as discharge planning.  Subjective:   Upon evaluation today patient presents more fluent with better eye contact compared to yesterday he continues to deny SI HI or AVH and does not appear responding to stimuli, continues to present with some thought blocking but seems milder than yesterday during presentation.  He denies side effect of medications and does not appear sleepy, does not display any signs consistent with EPS or TD.  He reports fair sleep and appetite.   He does tell me that he lived in a group home in 2005 but not since then, he cannot recall why he left but he tells me that he has been living with his mother since then.  He tells me his mother is his guardian.   Principal Problem: Schizophrenia (HCC) Diagnosis: Principal Problem:   Schizophrenia (HCC)  Total Time spent with patient: 30 minutes  Past Psychiatric History: see H&P  Past Medical History:  Past  Medical History:  Diagnosis Date   Gastroesophageal reflux disease 11/25/2017   History reviewed. No pertinent surgical history. Family History:  Family History  Problem Relation Age of Onset   Diabetes Mother    Diabetes Father    Prostate cancer Father    Family Psychiatric  History: see H&P Social History:  Social History   Substance and Sexual Activity  Alcohol Use No   Alcohol/week: 0.0 standard drinks of alcohol   Comment: rare alcohol     Social History   Substance and Sexual Activity  Drug Use No    Social History   Socioeconomic History   Marital status: Single    Spouse name: Not on file   Number of children: Not on file   Years of education: Not on file   Highest education level: Not on file  Occupational History   Not on file  Tobacco Use   Smoking status: Every Day    Current packs/day: 0.50    Types: Cigarettes   Smokeless tobacco: Never  Vaping Use   Vaping status: Never Used  Substance and Sexual Activity   Alcohol use: No    Alcohol/week: 0.0 standard drinks of alcohol    Comment: rare alcohol   Drug use: No   Sexual activity: Yes    Comment: male  Other Topics Concern   Not on file  Social History Narrative   Not on file   Social Drivers of Health   Financial Resource Strain: Not on file  Food Insecurity: No Food Insecurity (05/15/2023)   Hunger Vital Sign    Worried About Running Out  of Food in the Last Year: Never true    Ran Out of Food in the Last Year: Never true  Transportation Needs: No Transportation Needs (05/15/2023)   PRAPARE - Administrator, Civil Service (Medical): No    Lack of Transportation (Non-Medical): No  Physical Activity: Not on file  Stress: Not on file  Social Connections: Not on file   Additional Social History:                         Sleep: Fair  Appetite:  Fair  Current Medications: Current Facility-Administered Medications  Medication Dose Route Frequency Provider Last  Rate Last Admin   acetaminophen (TYLENOL) tablet 650 mg  650 mg Oral Q6H PRN Motley-Mangrum, Jadeka A, PMHNP       alum & mag hydroxide-simeth (MAALOX/MYLANTA) 200-200-20 MG/5ML suspension 30 mL  30 mL Oral Q4H PRN Motley-Mangrum, Jadeka A, PMHNP       ARIPiprazole (ABILIFY) tablet 15 mg  15 mg Oral Daily Zouev, Dmitri, MD   15 mg at 05/21/23 0810   benztropine (COGENTIN) tablet 0.5 mg  0.5 mg Oral BID PRN Motley-Mangrum, Jadeka A, PMHNP       haloperidol (HALDOL) tablet 5 mg  5 mg Oral TID PRN Motley-Mangrum, Jadeka A, PMHNP       And   diphenhydrAMINE (BENADRYL) capsule 50 mg  50 mg Oral TID PRN Motley-Mangrum, Jadeka A, PMHNP       haloperidol lactate (HALDOL) injection 5 mg  5 mg Intramuscular TID PRN Motley-Mangrum, Jadeka A, PMHNP       And   diphenhydrAMINE (BENADRYL) injection 50 mg  50 mg Intramuscular TID PRN Motley-Mangrum, Jadeka A, PMHNP       And   LORazepam (ATIVAN) injection 2 mg  2 mg Intramuscular TID PRN Motley-Mangrum, Jadeka A, PMHNP       haloperidol lactate (HALDOL) injection 10 mg  10 mg Intramuscular TID PRN Motley-Mangrum, Jadeka A, PMHNP       And   diphenhydrAMINE (BENADRYL) injection 50 mg  50 mg Intramuscular TID PRN Motley-Mangrum, Jadeka A, PMHNP       And   LORazepam (ATIVAN) injection 2 mg  2 mg Intramuscular TID PRN Motley-Mangrum, Jadeka A, PMHNP       hydrOXYzine (ATARAX) tablet 25 mg  25 mg Oral TID PRN Motley-Mangrum, Jadeka A, PMHNP       lithium carbonate (ESKALITH) ER tablet 450 mg  450 mg Oral Q12H Motley-Mangrum, Jadeka A, PMHNP   450 mg at 05/21/23 0810   magnesium hydroxide (MILK OF MAGNESIA) suspension 30 mL  30 mL Oral Daily PRN Motley-Mangrum, Jadeka A, PMHNP       nicotine (NICODERM CQ - dosed in mg/24 hours) patch 21 mg  21 mg Transdermal Daily Zouev, Dmitri, MD       OLANZapine (ZYPREXA) tablet 10 mg  10 mg Oral Daily Motley-Mangrum, Jadeka A, PMHNP   10 mg at 05/21/23 0810   OLANZapine (ZYPREXA) tablet 15 mg  15 mg Oral QHS Zouev, Dmitri, MD    15 mg at 05/20/23 2140   traZODone (DESYREL) tablet 50 mg  50 mg Oral QHS PRN Motley-Mangrum, Jadeka A, PMHNP        Lab Results:  Results for orders placed or performed during the hospital encounter of 05/15/23 (from the past 48 hours)  Lithium level     Status: Abnormal   Collection Time: 05/21/23  6:46 AM  Result Value Ref Range  Lithium Lvl 0.59 (L) 0.60 - 1.20 mmol/L    Comment: Performed at [facility], [address] [place], [place] [address]     Blood Alcohol level:  Lab Results  Component Value Date   ETH <10 05/15/2023   ETH <10 04/13/2021    Metabolic Disorder Labs: Lab Results  Component Value Date   HGBA1C 5.7 (H) 05/17/2023   MPG 117 05/17/2023   MPG 120 11/23/2019   Lab Results  Component Value Date   PROLACTIN 6.9 04/13/2021   PROLACTIN 3.3 (L) 11/23/2019   Lab Results  Component Value Date   CHOL 148 05/17/2023   TRIG 70 05/17/2023   HDL 56 05/17/2023   CHOLHDL 2.6 05/17/2023   VLDL 14 05/17/2023   LDLCALC 78 05/17/2023   LDLCALC 83 10/14/2022    Physical Findings: AIMS: Facial and Oral Movements Muscles of Facial Expression: None Lips and Perioral Area: None Jaw: None Tongue: None,Extremity Movements Upper (arms, wrists, hands, fingers): None Lower (legs, knees, ankles, toes): None, Trunk Movements Neck, shoulders, hips: None, Global Judgements Severity of abnormal movements overall : None Incapacitation due to abnormal movements: None Patient's awareness of abnormal movements: No Awareness, Dental Status Current problems with teeth and/or dentures?: No Does patient usually wear dentures?: No Edentia?: No  CIWA:    COWS:     Musculoskeletal: Strength & Muscle Tone: within normal limits Gait & Station: normal Patient leans: N/A  Psychiatric Specialty Exam:  Presentation  General Appearance:  Unkempt, dressed in hospital gown  Eye Contact: Fair Improved  Speech: Normal, organized  Speech  Volume: Decreased  Handedness:No data recorded  Mood and Affect  Mood: calm Affect: Flat   Thought Process  Thought Processes: Linear, minimal thought blocking  Descriptions of Associations: concrete  Orientation:Full (Time, Place and Person)  Thought Content: no delusions noted History of Schizophrenia/Schizoaffective disorder:Yes  Duration of Psychotic Symptoms:Greater than six months  Hallucinations:patient denies AVH doesn't appear responding to stimuli  Ideas of Reference: much less paranoid Suicidal Thoughts: patient denies  Homicidal Thoughts:patient denies   Sensorium  Memory: Immediate Fair  Judgment: limited Insight: limited  Executive Functions  Concentration: limited Attention Span: limited Recall: limited Fund of Knowledge: limited Language: limited  Psychomotor Activity  Psychomotor Activity: normal   Assets  Assets: Desire for Improvement; Housing; Social Support Access to care  Sleep  Improved, fair    Physical Exam: Physical exam: Please see exam on admit note. General: Well developed, well nourished.  Pupils: Normal at 3mm Respiratory: Breathing is unlabored.  Cardiovascular: No edema.  Language: No anomia, no aphasia Muscle strength and tone-pt moving all extremities.  Gait not assessed as pt remained in bed.  Neuro: Facial muscles are symmetric. Pt without tremor, no evidence of hyperarousal.  Review of Systems  Constitutional: Negative.   HENT: Negative.    Eyes: Negative.   Respiratory: Negative.    Cardiovascular: Negative.   Skin: Negative.    Blood pressure 107/84, pulse 92, temperature 97.9 F (36.6 C), resp. rate 16, height 5\' 11"  (1.803 m), weight 79.8 kg, SpO2 98%. Body mass index is 24.55 kg/m.   Treatment Plan Summary:   Lithium level 4/16 low therapeutic 0.56 - Continue Abilify to 15 mg qdaily - cont Zyprexa 10 mg qdaily and 15 mg qhs   Daily contact with patient to assess and evaluate  symptoms and progress in treatment, Medication management, and Plan - cont Zyprexa to 10 mg qdaily and 15 mg at bedtime  patient is tolerating  well without side effects, continue Lithium 450 mg BID (home dose), Cogentin 0.5 mg BID-PRN for EPS if needed however patient does not currently have EPS, continue Abilify 10 mg qdaily with plan for Abilify maintenna LAI at this time patient refusing to start LAI but will continue to encourage  Mahir Prabhakar Linnie Riches, MD 05/21/2023, 11:03 AM

## 2023-05-22 DIAGNOSIS — F200 Paranoid schizophrenia: Secondary | ICD-10-CM | POA: Diagnosis not present

## 2023-05-22 NOTE — Progress Notes (Signed)
  James [name]   Type of Note: Family meeting  In attendance: MD Linnie Riches, CSW, Jillyn Motto, pt's mother, [name], pt's sister via phone call.   Patient is due to discharge on Tuesday or Wednesday next week if continues to make good progress. Mother is aware. Mother to work on finding patient alternative temporary housing arrangement or a group home where patient will discharge to next week as he cannot return home. Mother and pt's sister are also currently working on switching pt's insurance to [employer] to enhance available services post discharge.  ACTT Referrals -   [facility] - Pt is not currently eligible for their ACTT services due to not having [facility]. Once switched to [facility], referral can be made on an outpatient basis. Information included in pt's follow up appointments.  [facility] - Referral sent 05/23/23, awaiting response.   [facility] - Pt was seen by this team in the past but services were stopped. Message left for Alannah who works in ACTT intake regarding ability to start services again. Awaiting response.   [facility] - Outpatient services only are provided for [facility]. ACTT is only available in [place], [place].    Signed:  Alexandria Current, LCSW-A 05/22/2023  3:14 PM

## 2023-05-22 NOTE — Plan of Care (Signed)
   Problem: Coping: Goal: Ability to verbalize frustrations and anger appropriately will improve Outcome: Progressing Goal: Ability to demonstrate self-control will improve Outcome: Progressing

## 2023-05-22 NOTE — Progress Notes (Signed)
   05/22/23 2015  Psych Admission Type (Psych Patients Only)  Admission Status Involuntary  Psychosocial Assessment  Patient Complaints Restlessness  Eye Contact Fair  Facial Expression Flat  Affect Preoccupied  Speech Logical/coherent  Interaction Minimal  Motor Activity Slow  Appearance/Hygiene Disheveled  Behavior Characteristics Cooperative  Mood Preoccupied  Thought Process  Coherency Blocking  Content Ambivalence  Delusions None reported or observed  Perception WDL  Hallucination None reported or observed  Judgment Limited  Confusion None  Danger to Self  Current suicidal ideation? Denies  Description of Suicide Plan No Plan  Agreement Not to Harm Self No  Description of Agreement Verbal Contract  Danger to Others  Danger to Others None reported or observed

## 2023-05-22 NOTE — Group Note (Signed)
 Recreation Therapy Group Note   Group Topic:Healthy Decision Making  Group Date: 05/22/2023 Start Time: 1015 End Time: 1045 Facilitators: Damaris Abeln-McCall, LRT,CTRS Location: [address] Dayroom   Group Topic: Decision Making  Goal Area(s) Addresses:  Patient will identify the impact decisions can make. Patient will identify benefit of making positive decisions.   Intervention: Movie  Group Description: Patients watched were the main character had to choose between making good and bad choices. Patients got to visually see how the decisions one makes can not only impact them but the people around them.  Education: Scientist, physiological, Discharge Planning   Education Outcome: Acknowledges education/In group clarification/Needs additional education   Affect/Mood: N/A   Participation Level: Did not attend    Clinical Observations/Individualized Feedback:     Plan: Continue to engage patient in RT group sessions 2-3x/week.   James [name], LRT,CTRS 05/22/2023 1:03 PM

## 2023-05-22 NOTE — Plan of Care (Signed)
   Problem: Education: Goal: Emotional status will improve Outcome: Progressing Goal: Mental status will improve Outcome: Progressing

## 2023-05-22 NOTE — Progress Notes (Signed)
 [facility] MD Progress Note  05/22/2023 12:49 PM James [name]  MRN:  [id number]  Chart review and nursing report: staff report patient having episodes of pacing the hallway but was not observed responding to stimuli yesterday or this morning which is quite an improvement.   MAR review indicates no as needed medication needed or given for anxiety or agitation.  Patient is compliant with scheduled medications on the unit  Collateral from mother as obtained on 4/14: Patient is on two antipsychotics due to treatment resistant schizophrenia with history of adverse reaction to Clozapine. Mother would like use to transition oral Abilify to Abilify maintenna given that patient has intermittent compliance. Patient's mother did not wish to transition from Abilify to [place] or Risperdal consta, due to concern for increased EPS and Parkinsonism/TD. Will plan for restarting oral Abilify and Abilify maintenna LAI.  Family meeting scheduled with patient mother on Thursday 4/17 to discuss patient's progress/improvement as well as discharge planning.  Subjective:   Upon evaluation today patient continues to present linear and organized yet concrete.  He continues to deny passive or active SI intention or plan, denies HI or AVH.  He does not present with any disorganized thought process, he is able to tell me the reason of his admission having identification with his sister that he has felt paranoid from her, he denies harming or attempting to harm his mother.  I discussed with him plan for family meeting today at and he is looking forward to it.  I discussed with patient to take shower to improve his hygiene given he presents disheveled and unkempt.  He denies side effect of medications, does not appear sleepy and does not display any sign consistent with EPS or TD.  I did continue to offer her Abilify Maintena LAI but he refuses and reports that he complies with p.o. medication and plans to comply after  discharge.  I discussed with patient that his sister filed a restraining order against him given his aggression toward her prior to admission, he continued to present calm and linear your with no issues noted.     Principal Problem: Schizophrenia (HCC) Diagnosis: Principal Problem:   Schizophrenia (HCC)  Total Time spent with patient: 30 minutes  Past Psychiatric History: see H&P  Past Medical History:  Past Medical History:  Diagnosis Date   Gastroesophageal reflux disease 11/25/2017   History reviewed. No pertinent surgical history. Family History:  Family History  Problem Relation Age of Onset   Diabetes Mother    Diabetes Father    Prostate cancer Father    Family Psychiatric  History: see H&P Social History:  Social History   Substance and Sexual Activity  Alcohol Use No   Alcohol/week: 0.0 standard drinks of alcohol   Comment: rare alcohol     Social History   Substance and Sexual Activity  Drug Use No    Social History   Socioeconomic History   Marital status: Single    Spouse name: Not on file   Number of children: Not on file   Years of education: Not on file   Highest education level: Not on file  Occupational History   Not on file  Tobacco Use   Smoking status: Every Day    Current packs/day: 0.50    Types: Cigarettes   Smokeless tobacco: Never  Vaping Use   Vaping status: Never Used  Substance and Sexual Activity   Alcohol use: No    Alcohol/week: 0.0 standard drinks of alcohol  Comment: rare alcohol   Drug use: No   Sexual activity: Yes    Comment: male  Other Topics Concern   Not on file  Social History Narrative   Not on file   Social Drivers of Health   Financial Resource Strain: Not on file  Food Insecurity: No Food Insecurity (05/19/2023)   Hunger Vital Sign    Worried About Running Out of Food in the Last Year: Never true    Ran Out of Food in the Last Year: Never true  Transportation Needs: No Transportation Needs  (05/19/2023)   PRAPARE - Administrator, Civil Service (Medical): No    Lack of Transportation (Non-Medical): No  Physical Activity: Not on file  Stress: Not on file  Social Connections: Not on file   Additional Social History:                         Sleep: Fair  Appetite:  Fair  Current Medications: Current Facility-Administered Medications  Medication Dose Route Frequency Provider Last Rate Last Admin   acetaminophen (TYLENOL) tablet 650 mg  650 mg Oral Q6H PRN Motley-Mangrum, Jadeka A, PMHNP       alum & mag hydroxide-simeth (MAALOX/MYLANTA) 200-200-20 MG/5ML suspension 30 mL  30 mL Oral Q4H PRN Motley-Mangrum, Jadeka A, PMHNP       ARIPiprazole (ABILIFY) tablet 15 mg  15 mg Oral Daily Zouev, Dmitri, MD   15 mg at 05/22/23 0825   benztropine (COGENTIN) tablet 0.5 mg  0.5 mg Oral BID PRN Motley-Mangrum, Jadeka A, PMHNP       haloperidol (HALDOL) tablet 5 mg  5 mg Oral TID PRN Motley-Mangrum, Jadeka A, PMHNP       And   diphenhydrAMINE (BENADRYL) capsule 50 mg  50 mg Oral TID PRN Motley-Mangrum, Jadeka A, PMHNP       haloperidol lactate (HALDOL) injection 5 mg  5 mg Intramuscular TID PRN Motley-Mangrum, Jadeka A, PMHNP       And   diphenhydrAMINE (BENADRYL) injection 50 mg  50 mg Intramuscular TID PRN Motley-Mangrum, Jadeka A, PMHNP       And   LORazepam (ATIVAN) injection 2 mg  2 mg Intramuscular TID PRN Motley-Mangrum, Jadeka A, PMHNP       haloperidol lactate (HALDOL) injection 10 mg  10 mg Intramuscular TID PRN Motley-Mangrum, Jadeka A, PMHNP       And   diphenhydrAMINE (BENADRYL) injection 50 mg  50 mg Intramuscular TID PRN Motley-Mangrum, Jadeka A, PMHNP       And   LORazepam (ATIVAN) injection 2 mg  2 mg Intramuscular TID PRN Motley-Mangrum, Jadeka A, PMHNP       hydrOXYzine (ATARAX) tablet 25 mg  25 mg Oral TID PRN Motley-Mangrum, Geralynn Ochs A, PMHNP       lithium carbonate (ESKALITH) ER tablet 450 mg  450 mg Oral Q12H Motley-Mangrum, Jadeka A, PMHNP    450 mg at 05/22/23 0826   magnesium hydroxide (MILK OF MAGNESIA) suspension 30 mL  30 mL Oral Daily PRN Motley-Mangrum, Jadeka A, PMHNP       nicotine (NICODERM CQ - dosed in mg/24 hours) patch 21 mg  21 mg Transdermal Daily Zouev, Dmitri, MD       OLANZapine (ZYPREXA) tablet 10 mg  10 mg Oral Daily Motley-Mangrum, Jadeka A, PMHNP   10 mg at 05/22/23 0825   OLANZapine (ZYPREXA) tablet 15 mg  15 mg Oral QHS Zouev, Dmitri, MD   15 mg  at 05/21/23 2107   traZODone (DESYREL) tablet 50 mg  50 mg Oral QHS PRN Motley-Mangrum, Jadeka A, PMHNP   50 mg at 05/21/23 2107    Lab Results:  Results for orders placed or performed during the hospital encounter of 05/15/23 (from the past 48 hours)  Lithium level     Status: Abnormal   Collection Time: 05/21/23  6:46 AM  Result Value Ref Range   Lithium Lvl 0.59 (L) 0.60 - 1.20 mmol/L    Comment: Performed at [facility], [address], [place], [place] [address]     Blood Alcohol level:  Lab Results  Component Value Date   ETH <10 05/15/2023   ETH <10 04/13/2021    Metabolic Disorder Labs: Lab Results  Component Value Date   HGBA1C 5.7 (H) 05/17/2023   MPG 117 05/17/2023   MPG 120 11/23/2019   Lab Results  Component Value Date   PROLACTIN 6.9 04/13/2021   PROLACTIN 3.3 (L) 11/23/2019   Lab Results  Component Value Date   CHOL 148 05/17/2023   TRIG 70 05/17/2023   HDL 56 05/17/2023   CHOLHDL 2.6 05/17/2023   VLDL 14 05/17/2023   LDLCALC 78 05/17/2023   LDLCALC 83 10/14/2022    Physical Findings: AIMS: Facial and Oral Movements Muscles of Facial Expression: None Lips and Perioral Area: None Jaw: None Tongue: None,Extremity Movements Upper (arms, wrists, hands, fingers): None Lower (legs, knees, ankles, toes): None, Trunk Movements Neck, shoulders, hips: None, Global Judgements Severity of abnormal movements overall : None Incapacitation due to abnormal movements: None Patient's awareness of abnormal  movements: No Awareness, Dental Status Current problems with teeth and/or dentures?: No Does patient usually wear dentures?: No Edentia?: No  CIWA:    COWS:     Musculoskeletal: Strength & Muscle Tone: within normal limits Gait & Station: normal Patient leans: N/A  Psychiatric Specialty Exam:  Presentation  General Appearance:  Unkempt, dressed in hospital gown  Eye Contact: Fair Improved  Speech: Normal, organized  Speech Volume: Decreased  Handedness:No data recorded  Mood and Affect  Mood: calm Affect: Flat   Thought Process  Thought Processes: Linear, minimal thought blocking  Descriptions of Associations: concrete  Orientation:Full (Time, Place and Person)  Thought Content: no delusions noted History of Schizophrenia/Schizoaffective disorder:Yes  Duration of Psychotic Symptoms:Greater than six months  Hallucinations:patient denies AVH doesn't appear responding to stimuli  Ideas of Reference: much less paranoid Suicidal Thoughts: patient denies  Homicidal Thoughts:patient denies   Sensorium  Memory: Immediate Fair  Judgment: limited Insight: limited  Executive Functions  Concentration: limited Attention Span: limited Recall: limited Fund of Knowledge: limited Language: limited  Psychomotor Activity  Psychomotor Activity: normal   Assets  Assets: Desire for Improvement; Housing; Social Support Access to care  Sleep  Improved, fair    Physical Exam: Physical exam: Please see exam on admit note. General: Well developed, well nourished.  Pupils: Normal at 3mm Respiratory: Breathing is unlabored.  Cardiovascular: No edema.  Language: No anomia, no aphasia Muscle strength and tone-pt moving all extremities.  Gait not assessed as pt remained in bed.  Neuro: Facial muscles are symmetric. Pt without tremor, no evidence of hyperarousal.  Review of Systems  Constitutional: Negative.   HENT: Negative.    Eyes:  Negative.   Respiratory: Negative.    Cardiovascular: Negative.   Skin: Negative.    Blood pressure 131/82, pulse 84, temperature 97.8 F (36.6 C), temperature source Oral, resp. rate 18, height 5\' 11"  (1.803  m), weight 79.8 kg, SpO2 100%. Body mass index is 24.55 kg/m.   Treatment Plan Summary:   Lithium level 4/16 low therapeutic 0.56 - Continue Abilify to 15 mg qdaily - cont Zyprexa 10 mg qdaily and 15 mg qhs   Daily contact with patient to assess and evaluate symptoms and progress in treatment, Medication management, and Plan - cont Zyprexa to 10 mg qdaily and 15 mg at bedtime  patient is tolerating well without side effects, continue Lithium 450 mg BID (home dose), Cogentin 0.5 mg BID-PRN for EPS if needed however patient does not currently have EPS, continue Abilify 10 mg qdaily with plan for Abilify maintenna LAI at this time patient refusing to start LAI but will continue to encourage  Kalianne Fetting Linnie Riches, MD 05/22/2023, 12:49 PM

## 2023-05-23 DIAGNOSIS — F200 Paranoid schizophrenia: Secondary | ICD-10-CM | POA: Diagnosis not present

## 2023-05-23 MED ORDER — TUBERCULIN PPD 5 UNIT/0.1ML ID SOLN
5.0000 [IU] | Freq: Once | INTRADERMAL | Status: AC
  Administered 2023-05-23: 5 [IU] via INTRADERMAL
  Filled 2023-05-23: qty 0.1

## 2023-05-23 MED ORDER — ARIPIPRAZOLE ER 400 MG IM SRER
400.0000 mg | INTRAMUSCULAR | Status: DC
  Administered 2023-05-23: 400 mg via INTRAMUSCULAR

## 2023-05-23 NOTE — Plan of Care (Signed)
   Problem: Education: Goal: Emotional status will improve Outcome: Progressing Goal: Verbalization of understanding the information provided will improve Outcome: Progressing

## 2023-05-23 NOTE — Progress Notes (Addendum)
  James [name]   Type of Note: Dispo plan  Spoke with pt's sister, Edrick Whitehorn [phone]) who reports that there is a group home out in [facility]) who has availability for patient to potentially transition there next Wednesday 4/23. Pt does need TB test and results before eligible. MD aware, test has been ordered. Pt's sister and mother (LG) will be visiting that home on Monday to decide if they would like patient to transition there after [facility]. Shawn to call this writer back on Monday with decision and next steps.   Group home contact: Gracie Lav [phone] (attempted 4/18 @ 1400, straight to VM)  Pt's sister also reports there are 3 other group homes they are looking into should this one fall through. Pt's family is aware that if patient is not able to transition to a group home next week, he will still discharge and other arrangements will need to be made. They are agreeable.  1433: Received phone call from a secondary placement [employer], Maryhelen Snide [phone] inquiring about this writer completing a 1915i assessment for patient. Edwina Gram was unaware this patient does not currently have MCD as this is needed for their facility.   Signed:  Jnae Thomaston, LCSW-A 05/23/2023  2:02 PM

## 2023-05-23 NOTE — Progress Notes (Signed)
 Pt visible in the milieu.  Interacting appropriately with staff and peers. Attended group.  Pt without complaints/concerns.  Denied SI, HI and AVH.  Needs assessed. Pt denied.  Safety checks continue for patient safety.  Pt safe on unit.
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
## 2023-05-23 NOTE — Group Note (Signed)
 Date:  05/23/2023 Time:  9:10 PM  Group Topic/Focus:  Wrap-Up Group:   The focus of this group is to help patients review their daily goal of treatment and discuss progress on daily workbooks.    Participation Level:  Active  Participation Quality:  Appropriate  Affect:  Appropriate  Cognitive:  Appropriate  Insight: Appropriate  Engagement in Group:  Developing/Improving  Modes of Intervention:  Discussion  Additional Comments:  Pt stated his goal for today was to focus on his treatment plan and discuss his discharge plan with his treatment team. Pt stated he accomplished his goals today. Pt stated he talked with his doctor and social worker about his care today. Pt rated his overall day a 7 out of 10. Pt stated he made no calls today. Pt stated he felt better about himself today. Pt stated he was able to attend all meals. Pt stated he took all medications provided today. Pt stated he attend all groups held today. Pt stated his appetite was pretty good today. Pt rated sleep last night was pretty good. Pt stated the goal tonight was to get some rest. Pt stated he had no physical pain tonight. Pt deny visual hallucinations and auditory issues tonight. Pt denies thoughts of harming himself or others. Pt stated he would alert staff if anything changed  Dwaine Gip 05/23/2023, 9:10 PM

## 2023-05-23 NOTE — Group Note (Signed)
 Recreation Therapy Group Note   Group Topic:Coping Skills  Group Date: 05/23/2023 Start Time: 1042 End Time: 1058 Facilitators: James [name], LRT,CTRS Location: [address] Dayroom   Group Topic: Coping Skills  Goal Area(s) Addresses:  Patient will identify the difference between healthy vs. Unhealthy coping skills. Patient will identify benefits of healthy coping skills. Patient will identify how coping skills can be used post d/c.  Intervention: Worksheet  Activity: LRT and patients discussed the difference between healthy and unhealthy coping strategies. LRT and patients also viewed different scenarios and discussed how each type of coping skill can affect the situation and outcome. Patients were to then identify a problem they are currently dealing with. Patients were to then identify the unhealthy coping skills they have used to address that problem and results. They would then identify healthy coping skills to address the same problem and identify the expected outcomes and barriers to using those healthy coping strategies.   Education: Pharmacologist, Building control surveyor.   Education Outcome: Acknowledges understanding/In group clarification offered/Needs additional education.    Affect/Mood: N/A   Participation Level: Did not attend    Clinical Observations/Individualized Feedback:     Plan: Continue to engage patient in RT group sessions 2-3x/week.   James [name], LRT,CTRS  05/23/2023 1:40 PM

## 2023-05-23 NOTE — [facility] Group Notes (Signed)
 Adult Psychoeducational Group Note  Date:  05/23/2023 Time:  6:54 PM  Group Topic/Focus:  Goals Group:   The focus of this group is to help patients establish daily goals to achieve during treatment and discuss how the patient can incorporate goal setting into their daily lives to aide in recovery. Orientation:   The focus of this group is to educate the patient on the purpose and policies of crisis stabilization and provide a format to answer questions about their admission.  The group details unit policies and expectations of patients while admitted.  Participation Level:  Active  Participation Quality:  Appropriate  Affect:  Appropriate  Cognitive:  Appropriate  Insight: Appropriate  Engagement in Group:  Engaged  Modes of Intervention:  Discussion  Additional Comments:  Pt attended the goals group and remained appropriate and engaged throughout the duration of the group.   Deauna Yaw O 05/23/2023, 6:54 PM

## 2023-05-24 MED ORDER — NICOTINE 21 MG/24HR TD PT24: 21.0000 mg | MEDICATED_PATCH | Freq: Every day | TRANSDERMAL | Status: DC | PRN

## 2023-05-24 NOTE — Group Note (Signed)
 Date:  05/24/2023 Time:  9:30 PM  Group Topic/Focus:  Wrap-Up Group:   The focus of this group is to help patients review their daily goal of treatment and discuss progress on daily workbooks.    Participation Level:  Did Not Attend   James [name] 05/24/2023, 9:30 PM

## 2023-05-24 NOTE — Progress Notes (Signed)
   05/23/23 2115  Psych Admission Type (Psych Patients Only)  Admission Status Involuntary  Psychosocial Assessment  Patient Complaints None  Eye Contact Fair  Facial Expression Flat  Affect Appropriate to circumstance  Speech Logical/coherent  Interaction Minimal  Motor Activity Slow  Appearance/Hygiene Unremarkable  Behavior Characteristics Cooperative;Calm  Mood Pleasant  Thought Process  Coherency WDL  Content WDL  Delusions None reported or observed  Perception WDL  Hallucination None reported or observed  Judgment WDL  Confusion None  Danger to Self  Current suicidal ideation? Denies  Agreement Not to Harm Self Yes  Description of Agreement Verbal  Danger to Others  Danger to Others None reported or observed

## 2023-05-24 NOTE — Plan of Care (Signed)
  Problem: Education: Goal: Mental status will improve Outcome: Progressing   Problem: Coping: Goal: Ability to verbalize frustrations and anger appropriately will improve Outcome: Progressing   Problem: Safety: Goal: Periods of time without injury will increase Outcome: Progressing
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
## 2023-05-24 NOTE — Plan of Care (Signed)
  Problem: Education: Goal: Knowledge of [facility] Education information/materials will improve Outcome: Progressing Goal: Emotional status will improve Outcome: Progressing Goal: Mental status will improve Outcome: Progressing   Problem: Safety: Goal: Periods of time without injury will increase Outcome: Progressing   

## 2023-05-24 NOTE — Progress Notes (Signed)
   05/24/23 0900  Psych Admission Type (Psych Patients Only)  Admission Status Involuntary  Psychosocial Assessment  Patient Complaints None  Eye Contact Fair  Facial Expression Flat  Affect Appropriate to circumstance  Speech Logical/coherent  Interaction Minimal  Motor Activity Slow  Appearance/Hygiene Unremarkable  Behavior Characteristics Cooperative  Mood Pleasant  Thought Process  Coherency WDL  Content WDL  Delusions None reported or observed  Perception WDL  Hallucination None reported or observed  Judgment WDL  Confusion None  Danger to Self  Current suicidal ideation? Denies  Description of Agreement Verbal  Danger to Others  Danger to Others None reported or observed

## 2023-05-24 NOTE — Progress Notes (Signed)
  Layla Prier   Type of Note: See progress note from 4/18  Update: Received another call from pt's sister, Adolm Ahumada who reports the name of the group home that [facility] referred them to is: [facility] and the contact is Jerryl Morin, [phone].  Jerryl Morin is requesting the following prior to transitioning to the home: FL2 TB Test results Psychiatric history report   Information to be sent to [email or web address] once TB results are in..   MD updated.  Signed:  Paden Kuras, LCSW-A 05/24/2023  11:38 AM

## 2023-05-25 DIAGNOSIS — F200 Paranoid schizophrenia: Secondary | ICD-10-CM | POA: Diagnosis not present
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
## 2023-05-25 NOTE — Progress Notes (Signed)
 Patient denies SI/HI/AVH this morning and reports sleeping good last night.  05/25/23 0900  Psych Admission Type (Psych Patients Only)  Admission Status Involuntary  Psychosocial Assessment  Patient Complaints None  Eye Contact Fair  Facial Expression Flat  Affect Appropriate to circumstance;Flat  Speech Slow  Interaction Minimal  Motor Activity Pacing  Appearance/Hygiene In scrubs;Unremarkable  Behavior Characteristics Cooperative;Appropriate to situation  Mood Pleasant  Thought Process  Coherency WDL  Content WDL  Delusions None reported or observed  Perception WDL  Hallucination None reported or observed  Judgment Limited  Confusion None  Danger to Self  Current suicidal ideation? Denies  Description of Suicide Plan no plan  Agreement Not to Harm Self Yes  Description of Agreement verbal  Danger to Others  Danger to Others None reported or observed

## 2023-05-25 NOTE — Progress Notes (Signed)
 Pt continues to be present in the milieu with minimal interaction, flat affect, and brief eye contact. He paces the halls periodically. Pt forwards little information, appears to have little spontaneous speech, although he responds appropriately to questions. At one point during a discussion with this writer, pt abruptly and with out prompt just walked away.  Pt denied SI/HI/AVH.    05/24/23 2045  Psych Admission Type (Psych Patients Only)  Admission Status Involuntary  Psychosocial Assessment  Patient Complaints None  Eye Contact Fair  Facial Expression Flat  Affect Appropriate to circumstance  Speech Logical/coherent;Other (Comment) (No spontaneous speech)  Interaction Minimal;Forwards little  Motor Activity Restless  Appearance/Hygiene Unremarkable;In scrubs;Disheveled  Behavior Characteristics Cooperative;Appropriate to situation;Restless  Mood Euthymic  Thought Process  Coherency WDL  Content WDL  Delusions None reported or observed  Perception WDL  Hallucination None reported or observed  Judgment WDL  Confusion None  Danger to Self  Current suicidal ideation? Denies  Agreement Not to Harm Self Yes  Description of Agreement Verbal  Danger to Others  Danger to Others None reported or observed

## 2023-05-25 NOTE — Plan of Care (Signed)
  Problem: Education: Goal: Emotional status will improve Outcome: Progressing Goal: Mental status will improve Outcome: Progressing Goal: Verbalization of understanding the information provided will improve Outcome: Progressing   Problem: Activity: Goal: Interest or engagement in activities will improve Outcome: Progressing   Problem: Safety: Goal: Periods of time without injury will increase Outcome: Progressing

## 2023-05-25 NOTE — Progress Notes (Signed)
 Patient ID: James [name], male   DOB: 08-May-1980, 43 y.o.   MRN: [id number] Assumed care of pt at this time. Pt observed intermittently pacing the unit but in no acute distress. Did join unit peers during meal time at lunch in the dining hall with no issues. He has voiced no acute concerns to writer at this time. Pt is safe.

## 2023-05-25 NOTE — Plan of Care (Signed)
  Problem: Education: Goal: Knowledge of [facility] Education information/materials will improve Outcome: Progressing Goal: Emotional status will improve Outcome: Progressing Goal: Mental status will improve Outcome: Progressing Goal: Verbalization of understanding the information provided will improve Outcome: Progressing   Problem: Coping: Goal: Ability to verbalize frustrations and anger appropriately will improve Outcome: Progressing

## 2023-05-25 NOTE — Group Note (Signed)
 Date:  05/25/2023 Time:  8:50 PM  Group Topic/Focus:  Wrap-Up Group:   The focus of this group is to help patients review their daily goal of treatment and discuss progress on daily workbooks.    Participation Level:  Did Not Attend  Participation Quality:  n/a  Affect:  n/a  Cognitive:  n/a  Insight: None  Engagement in Group:  n/a  Modes of Intervention:  n/a  Additional Comments:   Pt was encouraged to attend wrap up group but refused   Neena Beecham A Brantlee Hinde 05/25/2023, 8:50 PM

## 2023-05-25 NOTE — Plan of Care (Signed)
  Problem: Education: Goal: Mental status will improve Outcome: Progressing   Problem: Coping: Goal: Ability to demonstrate self-control will improve Outcome: Progressing   Problem: Safety: Goal: Periods of time without injury will increase Outcome: Progressing

## 2023-05-26 ENCOUNTER — Telehealth: Payer: Self-pay | Admitting: Licensed Clinical Social Worker

## 2023-05-26 ENCOUNTER — Other Ambulatory Visit: Payer: Self-pay | Admitting: Licensed Clinical Social Worker

## 2023-05-26 ENCOUNTER — Encounter (HOSPITAL_COMMUNITY): Payer: Self-pay

## 2023-05-26 DIAGNOSIS — F200 Paranoid schizophrenia: Secondary | ICD-10-CM | POA: Diagnosis not present

## 2023-05-26 NOTE — Patient Instructions (Signed)
 Visit Information  Thank you for taking time to visit with me today. Please don't hesitate to contact me if I can be of assistance to you before our next scheduled appointment.   Telephone follow-up in 1 week  Please call the care guide team at [phone] if you need to cancel, schedule, or reschedule an appointment.   Please call the Suicide and Crisis Lifeline: 988 go to [facility] [address] [place] [phone]) call 911 if you are experiencing a Mental Health or Behavioral Health Crisis or need someone to talk to.  Alease Hunter, LCSW [place]  [facility], [facility] Clinical Social Worker Direct Dial: [phone]  Fax: [phone] Website: [email or web address] 8:37 AM

## 2023-05-26 NOTE — Group Note (Signed)
 LCSW Group Therapy Note  Group Date: 05/26/2023 Start Time: 1300 End Time: 1400   Type of Therapy:  Group Therapy  Topic:  Coping Skills for Depression and Anxiety  Due to the acuity and complex discharge plans, group was not held. Patient was provided therapeutic worksheets and asked to meet with CSW as needed.   Trany Chernick O Laken Lobato, LCSWA 05/26/2023  3:47 PM

## 2023-05-26 NOTE — Group Note (Signed)
 Recreation Therapy Group Note   Group Topic:Personal Development  Group Date: 05/26/2023 Start Time: 1020 End Time: 1046 Facilitators: Garnell Begeman-McCall, LRT,CTRS Location: [address] Dayroom   Group Topic: Personal Development  Goal Area(s) Addresses:  Patient will define what a trigger is. Patient will identify what triggers them. Patient will successfully identify how they can face their triggers.   Intervention: Worksheet  Activity: LRT and patients discussed what triggers were and how they can affect them. Patients were then given a worksheet were they identified the problems their triggers contribute to. Patients then identified triggers for different categories such as people, places, thoughts,etc. Patients would then identify their biggest triggers, how to avoid/reduce exposure to them and how they can face triggers head on.  Education: Geophysicist/field seismologist, Discharge Planning  Education Outcome: Acknowledges education, In group clarification offered, Needs additional education   Affect/Mood: N/A   Participation Level: Did not attend    Clinical Observations/Individualized Feedback:     Plan: Continue to engage patient in RT group sessions 2-3x/week.   Breyona Swander-McCall, LRT,CTRS 05/26/2023 1:43 PM
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
## 2023-05-26 NOTE — [place] FL2 (Signed)
 [place]  MEDICAID [id number] LEVEL OF CARE FORM     IDENTIFICATION  Patient Name: James [name] Birthdate: Feb 25, 1980 Sex: male Admission Date (Current Location): 05/15/2023  [facility] and [place] Number:      Facility and Address:   [facility] [address], [place], [place]      Provider Number:    Attending Physician Name and Address:  Alver Jobs MD, [address]  Relative Name and Phone Number:   Augusta Hilbert (legal guardian) [phone]    Current Level of Care:  Inpatient Recommended Level of Care:  Group home or return to same living environment Prior Approval Number:    Date Approved/Denied:   PASRR Number:    Discharge Plan:  Discharging on Wednesday, 4/23    Current Diagnoses: Patient Active Problem List   Diagnosis Date Noted   Generalized anxiety disorder 05/15/2023   Schizophrenia, unspecified (HCC) 07/18/2020   Gastroesophageal reflux disease 11/25/2017   Schizophrenia (HCC) 11/25/2017    Orientation RESPIRATION BLADDER Height & Weight    A&O x4     Normal Continent  Weight: 79.8 kg Height:  5\' 11"  (180.3 cm)  BEHAVIORAL SYMPTOMS/MOOD NEUROLOGICAL BOWEL NUTRITION STATUS   Appropriate     Continent   Normal Diet  AMBULATORY STATUS COMMUNICATION OF NEEDS Skin    Independent, no assistance needed  Verbal, appropriate   Intact                       Personal Care Assistance Level of Assistance   Independent             Functional Limitations Info   Independent           SPECIAL CARE FACTORS FREQUENCY   None                    Contractures  None    Additional Factors Info   None               Current Medications (05/26/2023):  This is the current hospital active medication list Current Facility-Administered Medications  Medication Dose Route Frequency Provider Last Rate Last Admin   acetaminophen  (TYLENOL ) tablet 650 mg  650 mg Oral Q6H PRN Motley-Mangrum, Jadeka A, PMHNP        alum & mag hydroxide-simeth (MAALOX/MYLANTA) 200-200-20 MG/5ML suspension 30 mL  30 mL Oral Q4H PRN Motley-Mangrum, Jadeka A, PMHNP       ARIPiprazole  (ABILIFY ) tablet 15 mg  15 mg Oral Daily Zouev, Dmitri, MD   15 mg at 05/26/23 0981   ARIPiprazole  ER (ABILIFY  MAINTENA) injection 400 mg  400 mg Intramuscular Q28 days Linnie Riches, Nadir, MD   400 mg at 05/23/23 0813   benztropine  (COGENTIN ) tablet 0.5 mg  0.5 mg Oral BID PRN Motley-Mangrum, Jadeka A, PMHNP       haloperidol  (HALDOL ) tablet 5 mg  5 mg Oral TID PRN Motley-Mangrum, Jadeka A, PMHNP       And   diphenhydrAMINE  (BENADRYL ) capsule 50 mg  50 mg Oral TID PRN Motley-Mangrum, Jadeka A, PMHNP       haloperidol  lactate (HALDOL ) injection 5 mg  5 mg Intramuscular TID PRN Motley-Mangrum, Jadeka A, PMHNP       And   diphenhydrAMINE  (BENADRYL ) injection 50 mg  50 mg Intramuscular TID PRN Motley-Mangrum, Jadeka A, PMHNP       And   LORazepam  (ATIVAN ) injection 2 mg  2 mg Intramuscular TID PRN Motley-Mangrum, Jadeka  A, PMHNP       haloperidol  lactate (HALDOL ) injection 10 mg  10 mg Intramuscular TID PRN Motley-Mangrum, Jadeka A, PMHNP       And   diphenhydrAMINE  (BENADRYL ) injection 50 mg  50 mg Intramuscular TID PRN Motley-Mangrum, Jadeka A, PMHNP       And   LORazepam  (ATIVAN ) injection 2 mg  2 mg Intramuscular TID PRN Motley-Mangrum, Jadeka A, PMHNP       hydrOXYzine  (ATARAX ) tablet 25 mg  25 mg Oral TID PRN Motley-Mangrum, Jadeka A, PMHNP       lithium  carbonate (ESKALITH ) ER tablet 450 mg  450 mg Oral Q12H Motley-Mangrum, Jadeka A, PMHNP   450 mg at 05/26/23 0807   magnesium  hydroxide (MILK OF MAGNESIA) suspension 30 mL  30 mL Oral Daily PRN Motley-Mangrum, Jadeka A, PMHNP       nicotine  (NICODERM CQ  - dosed in mg/24 hours) patch 21 mg  21 mg Transdermal Daily PRN Hoang, Daniela B, MD       OLANZapine  (ZYPREXA ) tablet 10 mg  10 mg Oral Daily Motley-Mangrum, Jadeka A, PMHNP   10 mg at 05/26/23 1610   OLANZapine  (ZYPREXA ) tablet 15 mg  15 mg  Oral QHS Zouev, Dmitri, MD   15 mg at 05/25/23 1956   traZODone  (DESYREL ) tablet 50 mg  50 mg Oral QHS PRN Motley-Mangrum, Jadeka A, PMHNP   50 mg at 05/21/23 2107     Discharge Medications: Please see discharge summary for a list of discharge medications.  Relevant Imaging Results:  Relevant Lab Results:   Additional Information    Vonzell Guerin, LCSWA

## 2023-05-26 NOTE — Progress Notes (Addendum)
  Layla Prier   Type of Note: Group Home  Spoke with pt's mother/LG Chaden Doom and pt's sister via phone call today. Verbal permission give for CSW to send FL2, negative TB test results, and H&P to [email or web address] this afternoon. Verbal permission for CSW to contact Jerryl Morin, group home owner.   Family also reports they are touring 2 other group homes this afternoon and will update this Clinical research associate once a decision is made. Family aware patient to discharge on Wednesday regardless of group home placement. Family in agreement.  MD and tx team updated.  Called and LVM for Jane at [phone].  Information above has been sent 05/26/23.  1626: Spoke with pt's sister and mother again this afternoon. They are deciding between an AFL and group home (listed above). Shawn, pt's sister will call this writer back tomorrow morning with where patient will discharge to on Wednesday.  Will continue to assist as needed.   Signed:  Finnley Larusso, LCSW-A 05/26/2023  1:14 PM

## 2023-05-26 NOTE — Patient Outreach (Signed)
 Complex Care Management   Visit Note  05/22/2023  Name:  WADDELL ITEN MRN: [id number] DOB: 02-Apr-1980  Situation: Referral received for Complex Care Management related to [place]/Behavioral Health diagnosis Schizophrenia  I obtained verbal consent from Caregiver.  Visit completed with pt's sister  on the phone  Background:   Past Medical History:  Diagnosis Date   Gastroesophageal reflux disease 11/25/2017    Assessment: Patient Reported Symptoms:  Cognitive        Neurological      HEENT        Cardiovascular      Respiratory      Endocrine      Gastrointestinal        Genitourinary      Integumentary      Musculoskeletal          Psychosocial              03/30/2021    1:03 PM  Depression screen PHQ 2/9  Decreased Interest 0  Down, Depressed, Hopeless 0  PHQ - 2 Score 0    There were no vitals filed for this visit.  Medications Reviewed Today   Medications were not reviewed in this encounter     Recommendation:   F/up with Group Home Listings, Rapid Team Response, and TROSA  Follow Up Plan:   Telephone follow-up in 1 week  Alease Hunter, LCSW [facility]  [facility], [facility] Clinical Social Worker Direct Dial: [phone]  Fax: [phone] Website: [email or web address] 8:37 AM

## 2023-05-26 NOTE — Plan of Care (Signed)
  Problem: Health Behavior/Discharge Planning: Goal: Compliance with treatment plan for underlying cause of condition will improve Outcome: Progressing   Problem: Physical Regulation: Goal: Ability to maintain clinical measurements within normal limits will improve Outcome: Progressing   Problem: Safety: Goal: Periods of time without injury will increase Outcome: Progressing   

## 2023-05-26 NOTE — Progress Notes (Signed)
 Pt appears brighter and is connecting more in discussion.  Pt verbalized spontaneously, "I'm supposed to find out tomorrow about a group home where I can stay."  Pt denied SI/HI/AVH.  Pt appears disheveled and is wearing paper scrubs. Encouraged a shower and pt stated, "maybe."  Encouraged to tidy up and throw out old cups and plates.     05/25/23 2030  Psych Admission Type (Psych Patients Only)  Admission Status Involuntary  Psychosocial Assessment  Patient Complaints None  Eye Contact Fair  Facial Expression Flat  Affect Appropriate to circumstance  Speech Slow  Interaction Assertive  Motor Activity Pacing  Appearance/Hygiene In scrubs  Behavior Characteristics Cooperative  Mood Euthymic;Other (Comment) (brighter)  Thought Process  Coherency WDL  Content WDL  Delusions None reported or observed  Perception WDL  Hallucination None reported or observed  Judgment Limited  Confusion None  Danger to Self  Current suicidal ideation? Denies  Agreement Not to Harm Self Yes  Description of Agreement verbal  Danger to Others  Danger to Others None reported or observed

## 2023-05-26 NOTE — BH IP Treatment Plan (Signed)
 Interdisciplinary Treatment and Diagnostic Plan Update  05/26/2023 Time of Session: 12:10 AM - UPDATE COUPER JUNCAJ MRN: [id number]  Principal Diagnosis: Schizophrenia (HCC)  Secondary Diagnoses: Principal Problem:   Schizophrenia (HCC)   Current Medications:  Current Facility-Administered Medications  Medication Dose Route Frequency Provider Last Rate Last Admin   acetaminophen  (TYLENOL ) tablet 650 mg  650 mg Oral Q6H PRN Motley-Mangrum, Jadeka A, PMHNP       alum & mag hydroxide-simeth (MAALOX/MYLANTA) 200-200-20 MG/5ML suspension 30 mL  30 mL Oral Q4H PRN Motley-Mangrum, Jadeka A, PMHNP       ARIPiprazole  (ABILIFY ) tablet 15 mg  15 mg Oral Daily Zouev, Dmitri, MD   15 mg at 05/26/23 0806   ARIPiprazole  ER (ABILIFY  MAINTENA) injection 400 mg  400 mg Intramuscular Q28 days Linnie Riches, Nadir, MD   400 mg at 05/23/23 0813   benztropine  (COGENTIN ) tablet 0.5 mg  0.5 mg Oral BID PRN Motley-Mangrum, Jadeka A, PMHNP       haloperidol  (HALDOL ) tablet 5 mg  5 mg Oral TID PRN Motley-Mangrum, Jadeka A, PMHNP       And   diphenhydrAMINE  (BENADRYL ) capsule 50 mg  50 mg Oral TID PRN Motley-Mangrum, Jadeka A, PMHNP       haloperidol  lactate (HALDOL ) injection 5 mg  5 mg Intramuscular TID PRN Motley-Mangrum, Jadeka A, PMHNP       And   diphenhydrAMINE  (BENADRYL ) injection 50 mg  50 mg Intramuscular TID PRN Motley-Mangrum, Jadeka A, PMHNP       And   LORazepam  (ATIVAN ) injection 2 mg  2 mg Intramuscular TID PRN Motley-Mangrum, Jadeka A, PMHNP       haloperidol  lactate (HALDOL ) injection 10 mg  10 mg Intramuscular TID PRN Motley-Mangrum, Jadeka A, PMHNP       And   diphenhydrAMINE  (BENADRYL ) injection 50 mg  50 mg Intramuscular TID PRN Motley-Mangrum, Jadeka A, PMHNP       And   LORazepam  (ATIVAN ) injection 2 mg  2 mg Intramuscular TID PRN Motley-Mangrum, Jadeka A, PMHNP       hydrOXYzine  (ATARAX ) tablet 25 mg  25 mg Oral TID PRN Motley-Mangrum, Jadeka A, PMHNP       lithium  carbonate (ESKALITH ) ER  tablet 450 mg  450 mg Oral Q12H Motley-Mangrum, Jadeka A, PMHNP   450 mg at 05/26/23 0807   magnesium  hydroxide (MILK OF MAGNESIA) suspension 30 mL  30 mL Oral Daily PRN Motley-Mangrum, Jadeka A, PMHNP       nicotine  (NICODERM CQ  - dosed in mg/24 hours) patch 21 mg  21 mg Transdermal Daily PRN Hoang, Daniela B, MD       OLANZapine  (ZYPREXA ) tablet 10 mg  10 mg Oral Daily Motley-Mangrum, Jadeka A, PMHNP   10 mg at 05/26/23 2952   OLANZapine  (ZYPREXA ) tablet 15 mg  15 mg Oral QHS Zouev, Dmitri, MD   15 mg at 05/25/23 1956   traZODone  (DESYREL ) tablet 50 mg  50 mg Oral QHS PRN Motley-Mangrum, Jadeka A, PMHNP   50 mg at 05/21/23 2107   PTA Medications: Medications Prior to Admission  Medication Sig Dispense Refill Last Dose/Taking   ARIPiprazole  (ABILIFY ) 10 MG tablet Take 1 tablet (10 mg total) by mouth 2 (two) times daily. (Patient not taking: Reported on 05/15/2023) 60 tablet 1    ARIPiprazole  (ABILIFY ) 20 MG tablet Take 20 mg by mouth daily.      benztropine  (COGENTIN ) 0.5 MG tablet Take 1 tablet (0.5 mg total) by mouth 2 (two) times daily as needed  for tremors (EPS). (Patient taking differently: Take 0.5 mg by mouth 2 (two) times daily.) 60 tablet 3    cholecalciferol (VITAMIN D3) 25 MCG (1000 UNIT) tablet Take 2,000 Units by mouth daily.      lithium  carbonate (ESKALITH ) 450 MG ER tablet Take 1 tablet (450 mg total) by mouth every 12 (twelve) hours. 60 tablet 3    metFORMIN  (GLUCOPHAGE ) 500 MG tablet TAKE 1 TABLET BY MOUTH TWICE DAILY WITH MEALS (Patient not taking: Reported on 05/15/2023) 180 tablet 0    Multiple Vitamins-Minerals (MULTIVITAMIN WITH MINERALS) tablet Take 1 tablet by mouth daily.      OLANZapine  (ZYPREXA ) 10 MG tablet Take 10 mg by mouth daily.      OLANZapine  (ZYPREXA ) 20 MG tablet Take 20 mg by mouth at bedtime.       Patient Stressors: Marital or family conflict   Medication change or noncompliance    Patient Strengths: Manufacturing systems engineer  Physical Health  Supportive  family/friends   Treatment Modalities: Medication Management, Group therapy, Case management,  1 to 1 session with clinician, Psychoeducation, Recreational therapy.   Physician Treatment Plan for Primary Diagnosis: Schizophrenia (HCC) Long Term Goal(s): Improvement in symptoms so as ready for discharge   Short Term Goals: Ability to identify changes in lifestyle to reduce recurrence of condition will improve Ability to verbalize feelings will improve Ability to disclose and discuss suicidal ideas Ability to demonstrate self-control will improve Ability to identify and develop effective coping behaviors will improve Ability to maintain clinical measurements within normal limits will improve Compliance with prescribed medications will improve Ability to identify triggers associated with substance abuse/mental health issues will improve  Medication Management: Evaluate patient's response, side effects, and tolerance of medication regimen.  Therapeutic Interventions: 1 to 1 sessions, Unit Group sessions and Medication administration.  Evaluation of Outcomes: Progressing  Physician Treatment Plan for Secondary Diagnosis: Principal Problem:   Schizophrenia (HCC)  Long Term Goal(s): Improvement in symptoms so as ready for discharge   Short Term Goals: Ability to identify changes in lifestyle to reduce recurrence of condition will improve Ability to verbalize feelings will improve Ability to disclose and discuss suicidal ideas Ability to demonstrate self-control will improve Ability to identify and develop effective coping behaviors will improve Ability to maintain clinical measurements within normal limits will improve Compliance with prescribed medications will improve Ability to identify triggers associated with substance abuse/mental health issues will improve     Medication Management: Evaluate patient's response, side effects, and tolerance of medication regimen.  Therapeutic  Interventions: 1 to 1 sessions, Unit Group sessions and Medication administration.  Evaluation of Outcomes: Progressing   RN Treatment Plan for Primary Diagnosis: Schizophrenia (HCC) Long Term Goal(s): Knowledge of disease and therapeutic regimen to maintain health will improve  Short Term Goals: Ability to remain free from injury will improve, Ability to verbalize frustration and anger appropriately will improve, Ability to participate in decision making will improve, Ability to verbalize feelings will improve, Ability to identify and develop effective coping behaviors will improve, and Compliance with prescribed medications will improve  Medication Management: RN will administer medications as ordered by provider, will assess and evaluate patient's response and provide education to patient for prescribed medication. RN will report any adverse and/or side effects to prescribing provider.  Therapeutic Interventions: 1 on 1 counseling sessions, Psychoeducation, Medication administration, Evaluate responses to treatment, Monitor vital signs and CBGs as ordered, Perform/monitor CIWA, COWS, AIMS and Fall Risk screenings as ordered, Perform wound care treatments as ordered.  Evaluation of Outcomes: Progressing   LCSW Treatment Plan for Primary Diagnosis: Schizophrenia (HCC) Long Term Goal(s): Safe transition to appropriate next level of care at discharge, Engage patient in therapeutic group addressing interpersonal concerns.  Short Term Goals: Engage patient in aftercare planning with referrals and resources, Increase ability to appropriately verbalize feelings, Facilitate acceptance of mental health diagnosis and concerns, and Identify triggers associated with mental health/substance abuse issues  Therapeutic Interventions: Assess for all discharge needs, 1 to 1 time with Social worker, Explore available resources and support systems, Assess for adequacy in community support network, Educate family  and significant other(s) on suicide prevention, Complete Psychosocial Assessment, Interpersonal group therapy.  Evaluation of Outcomes: Progressing   Progress in Treatment: Attending groups: No. Participating in groups: No. Taking medication as prescribed: Yes. Toleration medication: Yes. Family/Significant other contact made:  Yes, contacted:  Enzo Has (LG/mom)  [phone] Patient understands diagnosis: Yes. Discussing patient identified problems/goals with staff: Yes. Medical problems stabilized or resolved: Yes. Denies suicidal/homicidal ideation: Yes. Issues/concerns per patient self-inventory: No   New problem(s) identified:  No   New Short Term/Long Term Goal(s):     medication stabilization, elimination of SI thoughts, development of comprehensive mental wellness plan.    Patient Goals:  "Last night, I started feeling confused."      Discharge Plan or Barriers:  Patient recently admitted. CSW will continue to follow and assess for appropriate referrals and possible discharge planning.    Reason for Continuation of Hospitalization: Hallucinations Homicidal ideation Medication stabilization   Estimated Length of Stay:  1 - 2 days  Last 3 [place] Suicide Severity Risk Score: Flowsheet Row [facility]14[facility] Admission (Discharged) [facility]  C-SSRS RISK CATEGORY No Risk No Risk No Risk       Last PHQ 2/9 Scores:    03/30/2021    1:03 PM 11/21/2019    4:21 AM 08/11/2018    1:21 PM  Depression screen PHQ 2/9  Decreased Interest 0 0 0  Down, Depressed, Hopeless 0 0 0  PHQ - 2 Score 0 0 0  Altered sleeping  1   Tired, decreased energy  0   Change in appetite  0   Feeling bad or failure about yourself   0   Trouble concentrating  1   Moving slowly or  fidgety/restless  0   Suicidal thoughts  0   PHQ-9 Score  2   Difficult doing work/chores  Somewhat difficult     Scribe for Treatment Team: Egan Sahlin O Sashia Campas, LCSWA 05/26/2023 6:45 PM

## 2023-05-27 ENCOUNTER — Telehealth: Payer: Self-pay | Admitting: Licensed Clinical Social Worker

## 2023-05-27 DIAGNOSIS — F201 Disorganized schizophrenia: Secondary | ICD-10-CM | POA: Diagnosis not present

## 2023-05-27 MED ORDER — ARIPIPRAZOLE 5 MG PO TABS
5.0000 mg | ORAL_TABLET | Freq: Once | ORAL | Status: AC
  Administered 2023-05-27: 5 mg via ORAL
  Filled 2023-05-27: qty 1

## 2023-05-27 MED ORDER — ARIPIPRAZOLE 10 MG PO TABS
20.0000 mg | ORAL_TABLET | Freq: Every day | ORAL | Status: DC
  Administered 2023-05-28: 20 mg via ORAL
  Filled 2023-05-27 (×2): qty 2

## 2023-05-27 NOTE — Plan of Care (Signed)
   Problem: Education: Goal: Emotional status will improve Outcome: Progressing   Problem: Activity: Goal: Interest or engagement in activities will improve Outcome: Progressing   Problem: Safety: Goal: Periods of time without injury will increase Outcome: Progressing

## 2023-05-27 NOTE — Patient Outreach (Signed)
 Complex Care Management   Visit Note  05/26/2023  Name:  James [name] MRN: [id number] DOB: [birth year]  Situation: Referral received for Complex Care Management related to [place]/Behavioral Health diagnosis GAD/Schizophrenia  I obtained verbal consent from Caregiver.  Visit completed with Shawn Muslim  on the phone  Background:   Past Medical History:  Diagnosis Date   Gastroesophageal reflux disease 11/25/2017    Assessment: Patient Reported Symptoms:  Cognitive        Neurological      HEENT        Cardiovascular      Respiratory      Endocrine      Gastrointestinal        Genitourinary      Integumentary      Musculoskeletal          Psychosocial              03/30/2021    1:03 PM  Depression screen PHQ 2/9  Decreased Interest 0  Down, Depressed, Hopeless 0  PHQ - 2 Score 0    There were no vitals filed for this visit.  Medications Reviewed Today   Medications were not reviewed in this encounter     Recommendation:   Continue utilizing strategies discussed to assist with symptom management  Follow Up Plan:   Telephone follow-up 1-2 weeks  Alease Hunter, LCSW [place]  [facility], [facility] Clinical Social Worker Direct Dial: [phone]  Fax: [phone] Website: [email or web address] 4:35 PM

## 2023-05-27 NOTE — Care Management Important Message (Signed)
 Patient informed of right to appeal discharge, provided phone number to [facility]. Patient expressed no interest in appealing discharge at this time. CSW will continue to monitor situation.

## 2023-05-27 NOTE — Patient Instructions (Signed)
 Visit Information  Thank you for taking time to visit with me today. Please don't hesitate to contact me if I can be of assistance to you before our next scheduled appointment.  Your next care management appointment is by telephone on 5/2 at 11:30 AM    Please call the care guide team at [phone] if you need to cancel, schedule, or reschedule an appointment.   Please call the Suicide and Crisis Lifeline: 988 go to [facility] Urgent [facility] [address], [place] [phone]) call 911 if you are experiencing a Mental Health or Behavioral Health Crisis or need someone to talk to.  Alease Hunter, LCSW [place]  [facility], [facility] Clinical Social Worker Direct Dial: [phone]  Fax: [phone] Website: [email or web address] 4:35 PM

## 2023-05-27 NOTE — [facility] Group Notes (Signed)

## 2023-05-27 NOTE — [facility] Group Notes (Signed)
 [facility] Group Notes:  (Nursing/MHT/Case Management/Adjunct)  Date:  05/27/2023  Time:  2000  Type of Therapy:   wrap up group  Participation Level:  Did Not Attend  Participation Quality:   Did not attend  Affect:   Did not attend  Cognitive:   Did not attend  Insight:  None  Engagement in Group:   Did not attend  Modes of Intervention:   Did not attend  Summary of Progress/Problems:  Tita Form 05/27/2023, 8:56 PM
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
## 2023-05-27 NOTE — Progress Notes (Signed)
   05/27/23 1000  Psych Admission Type (Psych Patients Only)  Admission Status Involuntary  Psychosocial Assessment  Patient Complaints None  Eye Contact Fair  Facial Expression Flat  Affect Constricted  Speech Slow  Interaction Minimal  Motor Activity Restless  Appearance/Hygiene In scrubs  Behavior Characteristics Pacing  Mood Preoccupied  Thought Process  Coherency WDL  Content Preoccupation  Delusions None reported or observed  Perception Hallucinations  Hallucination None reported or observed  Judgment Limited  Confusion None  Danger to Self  Current suicidal ideation? Denies  Danger to Others  Danger to Others None reported or observed

## 2023-05-27 NOTE — Care Management Important Message (Signed)
 Medicare IM printed and given to social work to give to the patient. ?

## 2023-05-27 NOTE — Plan of Care (Signed)
 ?  Problem: Activity: ?Goal: Interest or engagement in activities will improve ?Outcome: Progressing ?Goal: Sleeping patterns will improve ?Outcome: Progressing ?  ?Problem: Coping: ?Goal: Ability to verbalize frustrations and anger appropriately will improve ?Outcome: Progressing ?Goal: Ability to demonstrate self-control will improve ?Outcome: Progressing ?  ?Problem: Safety: ?Goal: Periods of time without injury will increase ?Outcome: Progressing ?  ?

## 2023-05-27 NOTE — Patient Outreach (Signed)
 Complex Care Management   Visit Note  05/23/2023  Name:  James [name] MRN: [id number] DOB: 11-20-1980  Situation: Referral received for Complex Care Management related to [facility]/Behavioral Health diagnosis Schizophrenia  I obtained verbal consent from Caregiver.  Visit completed with Shawn Muslim  on the phone  Background:   Past Medical History:  Diagnosis Date   Gastroesophageal reflux disease 11/25/2017    Assessment: Patient Reported Symptoms:  Cognitive        Neurological      HEENT        Cardiovascular      Respiratory      Endocrine      Gastrointestinal        Genitourinary      Integumentary      Musculoskeletal          Psychosocial              03/30/2021    1:03 PM  Depression screen PHQ 2/9  Decreased Interest 0  Down, Depressed, Hopeless 0  PHQ - 2 Score 0    There were no vitals filed for this visit.  Medications Reviewed Today   Medications were not reviewed in this encounter     Recommendation:   Continue utilizing strategies discussed to assist with symptom management  Follow Up Plan:   Telephone follow-up 05/26/23  Alease Hunter, LCSW [place]  [facility], [facility] Clinical Social Worker Direct Dial: [phone]  Fax: [phone] Website: [email or web address] 10:27 AM

## 2023-05-27 NOTE — Progress Notes (Signed)
   05/26/23 2045  Psych Admission Type (Psych Patients Only)  Admission Status Involuntary  Psychosocial Assessment  Patient Complaints None  Eye Contact Fair  Affect Constricted  Speech Slow  Interaction Cautious;Minimal  Motor Activity Restless;Pacing  Appearance/Hygiene In scrubs  Behavior Characteristics Cooperative;Pacing  Mood Preoccupied;Other (Comment) (Pt stated, "I'm ready to discharge. I'm ready to move on." Pt denied SI/HI/AVH and  any pain.)  Thought Process  Coherency WDL  Content Preoccupation  Delusions None reported or observed  Perception Hallucinations  Hallucination None reported or observed  Judgment Limited  Confusion None  Danger to Self  Current suicidal ideation? Denies  Agreement Not to Harm Self Yes  Description of Agreement Verbal  Danger to Others  Danger to Others None reported or observed

## 2023-05-27 NOTE — Progress Notes (Signed)
  James [name]   Type of Note: Discharge plan  Pt's Mother will be here tomorrow at 1:00 PM to pick patient up.  [facility] has been contacted in regards to completing intake assessment with pt prior to discharge for ACTT. If unable to do so, they will follow up with patient out in the community after discharge.  Signed:  Michaelann Gunnoe, LCSW-A 05/27/2023  3:46 PM

## 2023-05-27 NOTE — Patient Instructions (Signed)
 Visit Information  Thank you for taking time to visit with me today. Please don't hesitate to contact me if I can be of assistance to you before our next scheduled appointment.  Your next care management appointment is by telephone on 4/21 at 1:30 PM   Please call the care guide team at [phone] if you need to cancel, schedule, or reschedule an appointment.   Please call the Suicide and Crisis Lifeline: 988 go to [facility] [address] [place] [phone]) call 911 if you are experiencing a Mental Health or Behavioral Health Crisis or need someone to talk to.  Alease Hunter, LCSW [place]  [facility], [facility] Clinical Social Worker Direct Dial: [phone]  Fax: [phone] Website: [email or web address] 10:28 AM

## 2023-05-28 DIAGNOSIS — F201 Disorganized schizophrenia: Secondary | ICD-10-CM

## 2023-05-28 MED ORDER — ARIPIPRAZOLE 20 MG PO TABS: 20.0000 mg | ORAL_TABLET | Freq: Every day | ORAL | 0 refills | Status: AC

## 2023-05-28 MED ORDER — ARIPIPRAZOLE ER 400 MG IM SRER: 400.0000 mg | INTRAMUSCULAR | 2 refills | Status: DC

## 2023-05-28 MED ORDER — OLANZAPINE 10 MG PO TABS: 10.0000 mg | ORAL_TABLET | Freq: Every day | ORAL | 0 refills | Status: AC

## 2023-05-28 MED ORDER — LITHIUM CARBONATE ER 450 MG PO TBCR: 450.0000 mg | EXTENDED_RELEASE_TABLET | Freq: Two times a day (BID) | ORAL | 0 refills | Status: AC

## 2023-05-28 MED ORDER — HYDROXYZINE HCL 25 MG PO TABS: 25.0000 mg | ORAL_TABLET | Freq: Three times a day (TID) | ORAL | 0 refills | Status: AC | PRN

## 2023-05-28 MED ORDER — BENZTROPINE MESYLATE 0.5 MG PO TABS
0.5000 mg | ORAL_TABLET | Freq: Two times a day (BID) | ORAL | 3 refills | Status: AC | PRN
Start: 2023-05-28 — End: ?

## 2023-05-28 MED ORDER — OLANZAPINE 15 MG PO TABS
15.0000 mg | ORAL_TABLET | Freq: Every day | ORAL | 0 refills | Status: AC
Start: 2023-05-28 — End: ?

## 2023-05-28 MED ORDER — TRAZODONE HCL 50 MG PO TABS: 50.0000 mg | ORAL_TABLET | Freq: Every evening | ORAL | 0 refills | Status: AC | PRN

## 2023-05-28 MED ORDER — NICOTINE 21 MG/24HR TD PT24: 21.0000 mg | MEDICATED_PATCH | Freq: Every day | TRANSDERMAL | 0 refills | Status: DC | PRN

## 2023-05-28 NOTE — Progress Notes (Signed)
 Patient ID: James [name], male   DOB: [birth year], 43 y.o.   MRN: [id number] Patient discharged with mother. Denies SI/HI/AVH. No distress noted.

## 2023-05-28 NOTE — Plan of Care (Signed)

## 2023-05-28 NOTE — [facility] Suicide Risk Assessment (Signed)
 [facility] Discharge Suicide Risk Assessment   Principal Problem: Schizophrenia [facility]) Discharge Diagnoses: Principal Problem:   Schizophrenia (HCC)   Total Time spent with patient: 30 minutes  Musculoskeletal: Strength & Muscle Tone: within normal limits Gait & Station: normal Patient leans: N/A  Psychiatric Specialty Exam  Presentation  General Appearance:  Appropriate for Environment  Eye Contact: Fair  Speech: Normal Rate  Speech Volume: Decreased  Handedness: Right   Mood and Affect  Mood: Euthymic  Duration of Depression Symptoms: No data recorded Affect: Flat   Thought Process  Thought Processes: Linear  Descriptions of Associations:Intact  Orientation:Full (Time, Place and Person)  Thought Content:WDL  History of Schizophrenia/Schizoaffective disorder:Yes  Duration of Psychotic Symptoms:Greater than six months  Hallucinations:Hallucinations: None  Ideas of Reference:None  Suicidal Thoughts:Suicidal Thoughts: No  Homicidal Thoughts:Homicidal Thoughts: No   Sensorium  Memory: Immediate Fair  Judgment: Poor  Insight: Poor   Executive Functions  Concentration: Fair  Attention Span: Fair  Recall: Fair  Fund of Knowledge: Fair  Language: Fair   Psychomotor Activity  Psychomotor Activity:Psychomotor Activity: Normal   Assets  Assets: Communication Skills; Desire for Improvement; Social Support   Sleep  Sleep:Sleep: Fair   Physical Exam: Physical Exam ROS Blood pressure 122/85, pulse 85, temperature 97.6 F (36.4 C), resp. rate 18, height 5\' 11"  (1.803 m), weight 79.8 kg, SpO2 100%. Body mass index is 24.55 kg/m.  Mental Status Per Nursing Assessment::   On Admission:  NA  Demographic Factors:  Male, Low socioeconomic status, and Unemployed  Loss Factors: NA  Historical Factors: NA  Risk Reduction Factors:   Living with another person, especially a relative and Positive social support  Continued  Clinical Symptoms:  Schizophrenia:   Paranoid or undifferentiated type  Cognitive Features That Contribute To Risk:  Polarized thinking    Suicide Risk:  Minimal: No identifiable suicidal ideation.  Patients presenting with no risk factors but with morbid ruminations; may be classified as minimal risk based on the severity of the depressive symptoms   Follow-up Information     [facility] Follow up on 06/02/2023.   Why: You have an appointment for a hospital follow up on 06/02/23 @ 1:45 via virtual call. Please call if you wish to switch to an in person appointment. You will then continue with outpatient therapy services.  Medication management services are also available. Contact information: [address]  [address] [place] [address] [phone]         Saguier, Daelin, PA-C. Schedule an appointment as soon as possible for a visit.   Specialties: Internal Medicine, Family Medicine Why: You may also call this provider for medication management services. Contact information: [address] [place] [address] [phone]         [facility], [facility]. Call.   Why: You have been referred for ACTT services through this provider. Once [facility] is approved, please follow up with this agency to complete an intake assessment for services. Contact information: [address] [place] [address] [phone]                 Plan Of Care/Follow-up recommendations:  Other:  ACTs team followup as above  Yelina Sarratt, MD 05/28/2023, 10:33 AM

## 2023-05-28 NOTE — Progress Notes (Addendum)
  [facility] Adult Case Management Discharge Plan :  Will you be returning to the same living situation after discharge:  Yes,  pt will discharge to legal guardian back home  At discharge, do you have transportation home?: Yes,  pt will be picked up today at 12:30PM Do you have the ability to pay for your medications: Yes,  pt has active health insurance coverage  Release of information consent forms completed and in the chart;  Patient's signature needed at discharge.  Patient to Follow up at:  Follow-up Information     [facility] Follow up on 06/02/2023.   Why: You have an appointment for a hospital follow up on 06/02/23 @ 1:45 via virtual call. Please call if you wish to switch to an in person appointment. You will then continue with outpatient therapy services.  Medication management services are also available. Contact information: [address]  [address] [place] [address] [phone]         Saguier, Jahvon, PA-C. Schedule an appointment as soon as possible for a visit.   Specialties: Internal Medicine, Family Medicine Why: You may also call this provider for medication management services. Contact information: [address] [place] [address] [phone]         [facility], [facility]. Call.   Why: You have been referred for ACTT services through this provider. Once [facility] is approved, please follow up with this agency to complete an intake assessment for services. Contact information: [address] [place] [address] [phone]                 Next level of care provider has access to [facility] Link:no  Safety Planning and Suicide Prevention discussed: Valri Gee,  Enzo Has (mom) [phone]     Has patient been referred to the Quitline?: Patient refused referral for treatment  Patient has been referred for addiction treatment: No known substance use disorder.  Vonzell Guerin, LCSWA 05/28/2023, 10:28 AM
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
## 2023-05-28 NOTE — Progress Notes (Signed)
   05/27/23 2100  Psych Admission Type (Psych Patients Only)  Admission Status Involuntary  Psychosocial Assessment  Patient Complaints None  Eye Contact Fair  Facial Expression Flat  Affect Constricted  Speech Slow  Interaction Minimal  Motor Activity Slow  Appearance/Hygiene In scrubs  Behavior Characteristics Cooperative;Calm  Mood Preoccupied  Thought Process  Coherency WDL  Content Preoccupation  Delusions None reported or observed  Perception Hallucinations  Hallucination None reported or observed  Judgment Limited  Confusion None  Danger to Self  Current suicidal ideation? Denies  Description of Suicide Plan none  Agreement Not to Harm Self Yes  Description of Agreement verbal  Danger to Others  Danger to Others None reported or observed

## 2023-05-28 NOTE — Plan of Care (Signed)

## 2023-06-06 ENCOUNTER — Other Ambulatory Visit: Payer: Self-pay | Admitting: Licensed Clinical Social Worker

## 2023-06-09 NOTE — Patient Instructions (Signed)
 Visit Information  Thank you for taking time to visit with me today. Please don't hesitate to contact me if I can be of assistance to you before our next scheduled appointment.  Your next care management appointment is by telephone on 5/9 at 12:30 PM  Please call the care guide team at [phone] if you need to cancel, schedule, or reschedule an appointment.   Please call the Suicide and Crisis Lifeline: 988 call 911 if you are experiencing a Mental Health or Behavioral Health Crisis or need someone to talk to.  Alease Hunter, LCSW [place]  [facility], [facility] Clinical Social Worker Direct Dial: [phone]  Fax: [phone] Website: [email or web address] 5:07 PM

## 2023-06-09 NOTE — Patient Outreach (Signed)
 Complex Care Management   Visit Note  06/06/2023  Name:  James [name] MRN: [id number] DOB: 03-03-1980  Situation: Referral received for Complex Care Management related to [facility]/Behavioral Health diagnosis Schizophrenia/GAD  I obtained verbal consent from Caregiver Mrs. Peterson and Shawn Muslim.  Visit completed with Caregiver  on the phone  Background:   Past Medical History:  Diagnosis Date   Gastroesophageal reflux disease 11/25/2017    Assessment: Patient Reported Symptoms:  Cognitive Cognitive Status: Normal speech and language skills Cognitive/Intellectual Conditions Management [RPT]: Behavior Disorders Behavior Disorders: Schizophrenia, GAD      Neurological Neurological Review of Symptoms: No symptoms reported    HEENT HEENT Symptoms Reported: No symptoms reported      Cardiovascular Cardiovascular Symptoms Reported: No symptoms reported    Respiratory Respiratory Symptoms Reported: No symptoms reported    Endocrine Patient reports the following symptoms related to hypoglycemia or hyperglycemia : No symptoms reported    Gastrointestinal Gastrointestinal Symptoms Reported: No symptoms reported      Genitourinary Genitourinary Symptoms Reported: No symptoms reported    Integumentary Integumentary Symptoms Reported: No symptoms reported    Musculoskeletal Musculoskelatal Symptoms Reviewed: No symptoms reported   Falls in the past year?: No Number of falls in past year: 1 or less Was there an injury with Fall?: No Fall Risk Category Calculator: 0 Patient Fall Risk Level: Low Fall Risk Patient at Risk for Falls Due to: No Fall Risks  Psychosocial Psychosocial Symptoms Reported: No symptoms reported Additional Psychological Details: Patient diagnosed with Schizophrenia and GAD. Pt was recently d/c from inpatient and has been compliant with medications, as prescribed with the support of family and private aid. No symptoms of depression and anxiety  reported Behavioral Health Conditions: Anxiety, Other Other Behavorial Health Conditions: Schizophrenia Behavioral Management Strategies: Medication therapy Major Change/Loss/Stressor/Fears (CP): Medical condition, self, Death of a loved one Quality of Family Relationships: helpful, involved, supportive      03/30/2021    1:03 PM  Depression screen PHQ 2/9  Decreased Interest 0  Down, Depressed, Hopeless 0  PHQ - 2 Score 0    There were no vitals filed for this visit.  Medications Reviewed Today     Reviewed by Adriana Albany, LCSW (Social Worker) on 06/06/23 at 1243  Med List Status: <None>   Medication Order Taking? Sig Documenting Provider Last Dose Status Informant  ARIPiprazole  (ABILIFY ) 20 MG tablet [id number] Yes Take 1 tablet (20 mg total) by mouth daily. Zouev, Dmitri, MD Taking Active   ARIPiprazole  ER (ABILIFY  MAINTENA) 400 MG SRER injection [id number] Yes Inject 2 mLs (400 mg total) into the muscle every 28 (twenty-eight) days. Patient last received on 05/23/23 and is due for next LAI on 06/19/23 (patient will finish additional 10 days of Abilify  20 mg oral overlap) Zouev, Dmitri, MD Taking Active   benztropine  (COGENTIN ) 0.5 MG tablet [id number] Yes Take 1 tablet (0.5 mg total) by mouth 2 (two) times daily as needed for tremors (EPS). Zouev, Dmitri, MD Taking Active   cholecalciferol (VITAMIN D3) 25 MCG (1000 UNIT) tablet [id number] Yes Take 2,000 Units by mouth daily. [provider] Taking Active Mother, Pharmacy Records  hydrOXYzine  (ATARAX ) 25 MG tablet [id number] Yes Take 1 tablet (25 mg total) by mouth 3 (three) times daily as needed for anxiety. Zouev, Dmitri, MD Taking Active   lithium  carbonate (ESKALITH ) 450 MG ER tablet [id number] Yes Take 1 tablet (450 mg total) by mouth every 12 (twelve) hours. Zouev, Dmitri, MD Taking Active   Multiple  Vitamins-Minerals (MULTIVITAMIN WITH MINERALS) tablet [id number] Yes Take 1 tablet by mouth daily. [provider]  Taking Active Mother, Pharmacy Records  nicotine  (NICODERM CQ  - DOSED IN MG/24 HOURS) 21 mg/24hr patch [id number] No Place 1 patch (21 mg total) onto the skin daily as needed (nicotine  craving).  Patient not taking: Reported on 06/06/2023   Zouev, Dmitri, MD Not Taking Active   OLANZapine  (ZYPREXA ) 10 MG tablet [id number] Yes Take 1 tablet (10 mg total) by mouth daily. Zouev, Dmitri, MD Taking Active   OLANZapine  (ZYPREXA ) 15 MG tablet [id number] Yes Take 1 tablet (15 mg total) by mouth at bedtime. Zouev, Dmitri, MD Taking Active   traZODone  (DESYREL ) 50 MG tablet [id number] Yes Take 1 tablet (50 mg total) by mouth at bedtime as needed for sleep. Zouev, Dmitri, MD Taking Active             Recommendation:   Continue utilizing strategies discussed to assist with symptom management  Follow Up Plan:   Telephone follow-up 1-2 weeks  Alease Hunter, LCSW [facility]  [facility], [facility] Clinical Social Worker Direct Dial: [phone]  Fax: [phone] Website: [email or web address] 5:06 PM

## 2023-06-13 ENCOUNTER — Encounter: Payer: Self-pay | Admitting: Licensed Clinical Social Worker

## 2023-06-13 ENCOUNTER — Telehealth: Payer: Self-pay | Admitting: Licensed Clinical Social Worker

## 2023-08-12 ENCOUNTER — Other Ambulatory Visit (HOSPITAL_COMMUNITY): Payer: Self-pay

## 2023-10-15 ENCOUNTER — Ambulatory Visit (INDEPENDENT_AMBULATORY_CARE_PROVIDER_SITE_OTHER): Admitting: Medical

## 2023-10-15 ENCOUNTER — Encounter: Payer: Self-pay | Admitting: Medical

## 2023-10-15 VITALS — BP 112/80 | HR 90 | Temp 97.8°F | Resp 16 | Ht 71.0 in | Wt 185.4 lb

## 2023-10-15 DIAGNOSIS — Z79899 Other long term (current) drug therapy: Secondary | ICD-10-CM | POA: Diagnosis not present

## 2023-10-15 DIAGNOSIS — Z0000 Encounter for general adult medical examination without abnormal findings: Secondary | ICD-10-CM | POA: Diagnosis not present

## 2023-10-15 DIAGNOSIS — Z0184 Encounter for antibody response examination: Secondary | ICD-10-CM

## 2023-10-15 DIAGNOSIS — R739 Hyperglycemia, unspecified: Secondary | ICD-10-CM | POA: Diagnosis not present

## 2023-10-15 DIAGNOSIS — Z5181 Encounter for therapeutic drug level monitoring: Secondary | ICD-10-CM

## 2023-10-15 DIAGNOSIS — F200 Paranoid schizophrenia: Secondary | ICD-10-CM | POA: Diagnosis not present

## 2023-10-15 NOTE — Patient Instructions (Addendum)
 For you wellness exam today I have ordered cbc, cmp and lipid panel.  Lithium  level and then fax results to  psychiatrist- [phone](Attention Dr. Prentice Badger). Discussed current treatment for paranoid schizophrenia. Will make sure results faxed to MD office. Stay on current med regimen.  Vaccine declined. Flu and pneumonia.  Will check to check immunity status for hep b.  Recommend exercise and healthy diet.  We will let you know lab results as they come in.  Follow up date appointment will be determined after lab review.    Preventive Care 52-37 Years Old, Male Preventive care refers to lifestyle choices and visits with your health care provider that can promote health and wellness. Preventive care visits are also called wellness exams. What can I expect for my preventive care visit? Counseling During your preventive care visit, your health care provider may ask about your: Medical history, including: Past medical problems. Family medical history. Current health, including: Emotional well-being. Home life and relationship well-being. Sexual activity. Lifestyle, including: Alcohol, nicotine  or tobacco, and drug use. Access to firearms. Diet, exercise, and sleep habits. Safety issues such as seatbelt and bike helmet use. Sunscreen use. Work and work Astronomer. Physical exam Your health care provider will check your: Height and weight. These may be used to calculate your BMI (body mass index). BMI is a measurement that tells if you are at a healthy weight. Waist circumference. This measures the distance around your waistline. This measurement also tells if you are at a healthy weight and may help predict your risk of certain diseases, such as type 2 diabetes and high blood pressure. Heart rate and blood pressure. Body temperature. Skin for abnormal spots. What immunizations do I need?  Vaccines are usually given at various ages, according to a schedule. Your health  care provider will recommend vaccines for you based on your age, medical history, and lifestyle or other factors, such as travel or where you work. What tests do I need? Screening Your health care provider may recommend screening tests for certain conditions. This may include: Lipid and cholesterol levels. Diabetes screening. This is done by checking your blood sugar (glucose) after you have not eaten for a while (fasting). Hepatitis B test. Hepatitis C test. HIV (human immunodeficiency virus) test. STI (sexually transmitted infection) testing, if you are at risk. Lung cancer screening. Prostate cancer screening. Colorectal cancer screening. Talk with your health care provider about your test results, treatment options, and if necessary, the need for more tests. Follow these instructions at home: Eating and drinking  Eat a diet that includes fresh fruits and vegetables, whole grains, lean protein, and low-fat dairy products. Take vitamin and mineral supplements as recommended by your health care provider. Do not drink alcohol if your health care provider tells you not to drink. If you drink alcohol: Limit how much you have to 0-2 drinks a day. Know how much alcohol is in your drink. In the [place] one drink equals one 12 oz bottle of beer (355 mL), one 5 oz glass of wine (148 mL), or one 1 oz glass of hard liquor (44 mL). Lifestyle Brush your teeth every morning and night with fluoride toothpaste. Floss one time each day. Exercise for at least 30 minutes 5 or more days each week. Do not use any products that contain nicotine  or tobacco. These products include cigarettes, chewing tobacco, and vaping devices, such as e-cigarettes. If you need help quitting, ask your health care provider. Do not use drugs. If you are  sexually active, practice safe sex. Use a condom or other form of protection to prevent STIs. Take aspirin only as told by your health care provider. Make sure that you  understand how much to take and what form to take. Work with your health care provider to find out whether it is safe and beneficial for you to take aspirin daily. Find healthy ways to manage stress, such as: Meditation, yoga, or listening to music. Journaling. Talking to a trusted person. Spending time with friends and family. Minimize exposure to UV radiation to reduce your risk of skin cancer. Safety Always wear your seat belt while driving or riding in a vehicle. Do not drive: If you have been drinking alcohol. Do not ride with someone who has been drinking. When you are tired or distracted. While texting. If you have been using any mind-altering substances or drugs. Wear a helmet and other protective equipment during sports activities. If you have firearms in your house, make sure you follow all gun safety procedures. What's next? Go to your health care provider once a year for an annual wellness visit. Ask your health care provider how often you should have your eyes and teeth checked. Stay up to date on all vaccines. This information is not intended to replace advice given to you by your health care provider. Make sure you discuss any questions you have with your health care provider. Document Revised: 07/19/2020 Document Reviewed: 07/19/2020 Elsevier Patient Education  2024 [employer].

## 2023-10-15 NOTE — Progress Notes (Signed)
 Subjective:    Patient ID: James [name], male    DOB: Jun 30, 1980, 43 y.o.   MRN: [id number]  HPI  Pt in for wellness exam. Pt is not fasting.    Pt walks a lot around house and to store. Pt not working. Mom states wants him to do some work. Pt has schizophrenia. Pt not employed. Eats a lot of junk foods. Pt is smoking a lot less now. Only 6 cigarretes a day. Former smoked a pack.   Pt declines flu vaccine and pneumnomia vaccine.   Pt walks a lot around house and to store. Pt not working. Mom states wants him to do some work. Pt has schizophrenia. 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   Pt has schizophrenia. Mood is stable. Pt was on lithium  and zyprexa (cogentin  as needed). Has seen psychiatrist and therapist. Pt get abilfy injection every month. He now hasnew psychiatrist.   Pt had used trazodone  in the past. But psychiatrist won't refill.  Pt has worked with [employer] in the past.    Abby at [facility]([phone])gave pt list of 6 placed that might be able to give the injections.    Pt was also part on [facility].   Number to pt psychiatrist- [phone] Dr. Prentice Badger)    Review of Systems  Constitutional:  Negative for chills, fatigue and fever.  HENT:  Negative for congestion and dental problem.   Respiratory:  Negative for cough, chest tightness and wheezing.   Cardiovascular:  Negative for chest pain and palpitations.  Gastrointestinal:  Negative for abdominal pain, anal bleeding, blood in stool and nausea.  Genitourinary:  Negative for enuresis.  Musculoskeletal:  Negative for back pain, joint swelling and neck pain.  Skin:  Negative for rash.  Neurological:  Negative for dizziness, seizures, weakness and light-headedness.  Hematological:  Negative for adenopathy. Does not bruise/bleed easily.  Psychiatric/Behavioral:  Negative for behavioral problems, dysphoric mood, self-injury and suicidal ideas. The patient is not  hyperactive.        No auditory or visual hallucinatons.    Past Medical History:  Diagnosis Date   Gastroesophageal reflux disease 11/25/2017     Social History   Socioeconomic History   Marital status: Single    Spouse name: Not on file   Number of children: Not on file   Years of education: Not on file   Highest education level: Not on file  Occupational History   Not on file  Tobacco Use   Smoking status: Every Day    Current packs/day: 0.50    Types: Cigarettes   Smokeless tobacco: Never  Vaping Use   Vaping status: Never Used  Substance and Sexual Activity   Alcohol use: No    Alcohol/week: 0.0 standard drinks of alcohol    Comment: rare alcohol   Drug use: No   Sexual activity: Yes    Comment: male  Other Topics Concern   Not on file  Social History Narrative   Not on file   Social Drivers of Health   Financial Resource Strain: Not on file  Food Insecurity: No Food Insecurity (05/19/2023)   Hunger Vital Sign    Worried About Running Out of Food in the Last Year: Never true    Ran Out of Food in the Last Year: Never true  Transportation Needs: No Transportation Needs (05/19/2023)   PRAPARE - Administrator, Civil Service (Medical): No    Lack of Transportation (Non-Medical):  No  Physical Activity: Not on file  Stress: Not on file  Social Connections: Not on file  Intimate Partner Violence: Not At Risk (05/19/2023)   Humiliation, Afraid, Rape, and Kick questionnaire    Fear of Current or Ex-Partner: No    Emotionally Abused: No    Physically Abused: No    Sexually Abused: No    No past surgical history on file.  Family History  Problem Relation Age of Onset   Diabetes Mother    Diabetes Father    Prostate cancer Father     Allergies  Allergen Reactions   Clozapine Other (See Comments)    Reaction unknown   Latuda  [Lurasidone ] Other (See Comments)    Over time developed a termor in his hands.   Mango Flavoring Agent  (Non-Screening)     Mango; [place]: Patient has no known reaction to report. Mom states he just don't like.    Current Outpatient Medications on File Prior to Visit  Medication Sig Dispense Refill   ARIPiprazole  (ABILIFY ) 20 MG tablet Take 1 tablet (20 mg total) by mouth daily. 10 tablet 0   benztropine  (COGENTIN ) 0.5 MG tablet Take 1 tablet (0.5 mg total) by mouth 2 (two) times daily as needed for tremors (EPS). 60 tablet 3   cholecalciferol (VITAMIN D3) 25 MCG (1000 UNIT) tablet Take 2,000 Units by mouth daily.     hydrOXYzine  (ATARAX ) 25 MG tablet Take 1 tablet (25 mg total) by mouth 3 (three) times daily as needed for anxiety. 30 tablet 0   lithium  carbonate (ESKALITH ) 450 MG ER tablet Take 1 tablet (450 mg total) by mouth every 12 (twelve) hours. 60 tablet 0   Multiple Vitamins-Minerals (MULTIVITAMIN WITH MINERALS) tablet Take 1 tablet by mouth daily.     OLANZapine  (ZYPREXA ) 10 MG tablet Take 1 tablet (10 mg total) by mouth daily. 30 tablet 0   OLANZapine  (ZYPREXA ) 15 MG tablet Take 1 tablet (15 mg total) by mouth at bedtime. 30 tablet 0   traZODone  (DESYREL ) 50 MG tablet Take 1 tablet (50 mg total) by mouth at bedtime as needed for sleep. 30 tablet 0   No current facility-administered medications on file prior to visit.    BP 112/80   Pulse 90   Temp 97.8 F (36.6 C) (Oral)   Resp 16   Ht 5' 11 (1.803 m)   Wt 185 lb 6.4 oz (84.1 kg)   SpO2 95%   BMI 25.86 kg/m        Objective:   Physical Exam  General Mental Status- Alert. General Appearance- Not in acute distress.   Skin General: Color- Normal Color. Moisture- Normal Moisture.  Neck  No JVD.  Chest and Lung Exam Auscultation: Breath Sounds:-Normal.  Cardiovascular Auscultation:Rythm- Regular. Murmurs & Other Heart Sounds:Auscultation of the heart reveals- No Murmurs.  Abdomen Inspection:-Inspeection Normal. Palpation/Percussion:Note:No mass. Palpation and Percussion of the abdomen reveal- Non Tender,  Non Distended + BS, no rebound or guarding.   Neurologic Cranial Nerve exam:- CN III-XII intact(No nystagmus), symmetric smile. Strength:- 5/5 equal and symmetric strength both upper and lower extremities.       Assessment & Plan:   For you wellness exam today I have ordered cbc, cmp and lipid panel.  Lithium  level and then fax results to  psychiatrist- [phone](Attention Dr. Prentice Badger). Discussed current treatment for paranoid schizophrenia. Will make sure results faxed to MD office. Stay on current med regimen.  Vaccine declined. Flu and pneumonia.  Will check to check immunity  status for hep b.  Recommend exercise and healthy diet.  We will let you know lab results as they come in.  Follow up date appointment will be determined after lab review.    Dallas Maxwell, PA-C   [id number] charge as did discuss review behavioral health concerns, ordering lithium  level and will make sure specialist gets lab results faxed to his office.

## 2023-10-16 ENCOUNTER — Ambulatory Visit: Payer: Self-pay | Admitting: Medical

## 2023-10-16 LAB — COMPREHENSIVE METABOLIC PANEL WITH GFR
ALT: 29 U/L (ref 0–53)
AST: 18 U/L (ref 0–37)
Albumin: 4.8 g/dL (ref 3.5–5.2)
Alkaline Phosphatase: 63 U/L (ref 39–117)
BUN: 10 mg/dL (ref 6–23)
CO2: 27 meq/L (ref 19–32)
Calcium: 9.8 mg/dL (ref 8.4–10.5)
Chloride: 107 meq/L (ref 96–112)
Creatinine, Ser: 1.05 mg/dL (ref 0.40–1.50)
GFR: 86.3 mL/min (ref >60.00)
Glucose, Bld: 101 mg/dL — ABNORMAL HIGH (ref 70–99)
Potassium: 4 meq/L (ref 3.5–5.1)
Sodium: 142 meq/L (ref 135–145)
Total Bilirubin: 0.3 mg/dL (ref 0.2–1.2)
Total Protein: 7.3 g/dL (ref 6.0–8.3)

## 2023-10-16 LAB — LIPID PANEL
Cholesterol: 188 mg/dL (ref 0–200)
HDL: 50.5 mg/dL (ref >39.00)
LDL Cholesterol: 84 mg/dL (ref 0–99)
NonHDL: 137.22
Total CHOL/HDL Ratio: 4
Triglycerides: 268 mg/dL — ABNORMAL HIGH (ref 0.0–149.0)
VLDL: 53.6 mg/dL — ABNORMAL HIGH (ref 0.0–40.0)

## 2023-10-16 LAB — CBC WITH DIFFERENTIAL/PLATELET
Basophils Absolute: 0 K/uL (ref 0.0–0.1)
Basophils Relative: 0.8 % (ref 0.0–3.0)
Eosinophils Absolute: 0.2 K/uL (ref 0.0–0.7)
Eosinophils Relative: 3.2 % (ref 0.0–5.0)
HCT: 45 % (ref 39.0–52.0)
Hemoglobin: 14.6 g/dL (ref 13.0–17.0)
Lymphocytes Relative: 40.6 % (ref 12.0–46.0)
Lymphs Abs: 2.2 K/uL (ref 0.7–4.0)
MCHC: 32.6 g/dL (ref 30.0–36.0)
MCV: 89.3 fl (ref 78.0–100.0)
Monocytes Absolute: 0.3 K/uL (ref 0.1–1.0)
Monocytes Relative: 5 % (ref 3.0–12.0)
Neutro Abs: 2.7 K/uL (ref 1.4–7.7)
Neutrophils Relative %: 50.4 % (ref 43.0–77.0)
Platelets: 222 K/uL (ref 150.0–400.0)
RBC: 5.03 Mil/uL (ref 4.22–5.81)
RDW: 14.6 % (ref 11.5–15.5)
WBC: 5.4 K/uL (ref 4.0–10.5)

## 2023-10-16 LAB — LITHIUM LEVEL: Lithium Lvl: 0.7 mmol/L (ref 0.6–1.2)

## 2023-10-16 LAB — HEMOGLOBIN A1C: Hgb A1c MFr Bld: 5.8 % (ref 4.6–6.5)

## 2023-10-16 LAB — HEPATITIS B SURFACE ANTIBODY, QUANTITATIVE: Hep B S AB Quant (Post): <5 m[IU]/mL — ABNORMAL LOW (ref >=10)

## 2023-10-17 ENCOUNTER — Telehealth: Payer: Self-pay

## 2023-10-17 NOTE — Telephone Encounter (Signed)
 Called pt again to update on lab results but received no answer

## 2023-10-17 NOTE — Progress Notes (Signed)
 Lab Letter sent via mail

## 2023-10-17 NOTE — Telephone Encounter (Signed)
 Called pts mother and went over lab results  Also scheduled a 6 month follow up while on the phone

## 2023-10-17 NOTE — Progress Notes (Signed)
 Lithium  labs sent

## 2023-10-17 NOTE — Telephone Encounter (Signed)
 Copied from CRM #[id number]. Topic: Clinical - Lab/Test Results >> Oct 17, 2023  1:15 PM [place] J wrote: Reason for CRM: Patient's mother calling to confirm the patient's lithium  and sugar results. She did not want the rest of the results relayed since there will be a letter sent out with result information. She would like to know when the provider would like to see the patient again for a follow up.

## 2024-04-13 ENCOUNTER — Ambulatory Visit: Admitting: Medical
